# Patient Record
Sex: Male | Born: 1955 | Race: Asian | Hispanic: No | Marital: Married | Smoking: Former smoker
Health system: Southern US, Community
[De-identification: ages and names within clinical notes are randomized; demographics above are authoritative.]

## PROBLEM LIST (undated history)

## (undated) DIAGNOSIS — E785 Hyperlipidemia, unspecified: Secondary | ICD-10-CM

## (undated) DIAGNOSIS — U071 COVID-19: Secondary | ICD-10-CM

## (undated) DIAGNOSIS — M1990 Unspecified osteoarthritis, unspecified site: Secondary | ICD-10-CM

## (undated) DIAGNOSIS — Z9889 Other specified postprocedural states: Secondary | ICD-10-CM

## (undated) DIAGNOSIS — E119 Type 2 diabetes mellitus without complications: Secondary | ICD-10-CM

## (undated) DIAGNOSIS — H269 Unspecified cataract: Secondary | ICD-10-CM

## (undated) DIAGNOSIS — I10 Essential (primary) hypertension: Secondary | ICD-10-CM

## (undated) DIAGNOSIS — T7840XA Allergy, unspecified, initial encounter: Secondary | ICD-10-CM

## (undated) HISTORY — DX: COVID-19: U07.1

## (undated) HISTORY — DX: Allergy, unspecified, initial encounter: T78.40XA

## (undated) HISTORY — DX: Essential (primary) hypertension: I10

## (undated) HISTORY — DX: Hyperlipidemia, unspecified: E78.5

## (undated) HISTORY — PX: POLYPECTOMY: SHX149

## (undated) HISTORY — DX: Unspecified cataract: H26.9

## (undated) HISTORY — PX: CYST REMOVAL NECK: SHX6281

## (undated) HISTORY — PX: COLONOSCOPY: SHX174

## (undated) HISTORY — DX: Unspecified osteoarthritis, unspecified site: M19.90

## (undated) HISTORY — DX: Type 2 diabetes mellitus without complications: E11.9

## (undated) HISTORY — PX: CARPAL TUNNEL RELEASE: SHX101

## (undated) HISTORY — PX: CATARACT EXTRACTION: SUR2

## (undated) HISTORY — DX: Other specified postprocedural states: Z98.890

---

## 1988-01-08 HISTORY — PX: CHOLECYSTECTOMY: SHX55

## 2011-04-08 ENCOUNTER — Ambulatory Visit (INDEPENDENT_AMBULATORY_CARE_PROVIDER_SITE_OTHER): Payer: Federal, State, Local not specified - PPO | Admitting: Internal Medicine

## 2011-04-08 VITALS — BP 131/77 | HR 70 | Temp 98.6°F | Resp 16 | Ht 62.5 in | Wt 151.0 lb

## 2011-04-08 DIAGNOSIS — Z0000 Encounter for general adult medical examination without abnormal findings: Secondary | ICD-10-CM

## 2011-04-08 DIAGNOSIS — H409 Unspecified glaucoma: Secondary | ICD-10-CM | POA: Insufficient documentation

## 2011-04-08 DIAGNOSIS — H9191 Unspecified hearing loss, right ear: Secondary | ICD-10-CM

## 2011-04-08 DIAGNOSIS — R739 Hyperglycemia, unspecified: Secondary | ICD-10-CM

## 2011-04-08 DIAGNOSIS — E785 Hyperlipidemia, unspecified: Secondary | ICD-10-CM

## 2011-04-08 DIAGNOSIS — R7309 Other abnormal glucose: Secondary | ICD-10-CM

## 2011-04-08 DIAGNOSIS — I10 Essential (primary) hypertension: Secondary | ICD-10-CM | POA: Insufficient documentation

## 2011-04-08 LAB — POCT CBC
Lymph, poc: 2.3 (ref 0.6–3.4)
MCHC: 33 g/dL (ref 31.8–35.4)
MPV: 11.8 fL (ref 0–99.8)
POC Granulocyte: 5.2 (ref 2–6.9)
POC LYMPH PERCENT: 28.6 %L (ref 10–50)
POC MID %: 6.9 %M (ref 0–12)
RDW, POC: 14 %

## 2011-04-08 LAB — COMPREHENSIVE METABOLIC PANEL
ALT: 41 U/L (ref 0–53)
AST: 34 U/L (ref 0–37)
BUN: 20 mg/dL (ref 6–23)
CO2: 27 mEq/L (ref 19–32)
Creat: 1.03 mg/dL (ref 0.50–1.35)
Total Bilirubin: 0.5 mg/dL (ref 0.3–1.2)

## 2011-04-08 LAB — PSA: PSA: 1.4 ng/mL (ref <=4.00)

## 2011-04-08 LAB — LIPID PANEL
HDL: 47 mg/dL (ref >39)
Total CHOL/HDL Ratio: 3.4 Ratio
Triglycerides: 88 mg/dL (ref <150)

## 2011-04-08 LAB — POCT GLYCOSYLATED HEMOGLOBIN (HGB A1C): Hemoglobin A1C: 6.1

## 2011-04-08 LAB — IFOBT (OCCULT BLOOD): IFOBT: NEGATIVE

## 2011-04-08 MED ORDER — ROSUVASTATIN CALCIUM 20 MG PO TABS: 20.0000 mg | ORAL_TABLET | Freq: Every day | ORAL | Status: DC

## 2011-04-08 MED ORDER — AMLODIPINE BESYLATE 10 MG PO TABS: 10.0000 mg | ORAL_TABLET | Freq: Every day | ORAL | Status: DC

## 2011-04-08 NOTE — Progress Notes (Signed)
  Subjective:    Patient ID: Jesse Baldwin, male    DOB: July 19, 1955, 56 y.o.   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   HENT: Negative.   Eyes: Negative.   Respiratory: Negative.   Cardiovascular: Negative.   Gastrointestinal: Negative.   Genitourinary: Negative.   Musculoskeletal: Negative.        Has mild left lateral epicondylitis and uses a strap at work  Skin: Negative.   Neurological: Negative.   Hematological: Negative.   Psychiatric/Behavioral: Negative.        Objective:   Physical Exam  Constitutional: He is oriented to person, place, and time. He appears well-developed and well-nourished.  HENT:  Head: Normocephalic.  Right Ear: External ear normal.  Left Ear: External ear normal.       There is decreased hearing in the right ear from noise injury  Eyes: Conjunctivae and EOM are normal. Pupils are equal, round, and reactive to light.       Both eyes have slight injection as usual  Neck: Normal range of motion. Neck supple. No thyromegaly present.  Cardiovascular: Normal rate, regular rhythm, normal heart sounds and intact distal pulses.   No murmur heard. Pulmonary/Chest: Effort normal and breath sounds normal.  Abdominal: Soft. Bowel sounds are normal.       No organomegaly  Genitourinary: Rectum normal and prostate normal.       Hemosure pending  Musculoskeletal: Normal range of motion. He exhibits no edema.  Neurological: He is alert and oriented to person, place, and time.  Skin: No rash noted.  Psychiatric: He has a normal mood and affect. Thought content normal.          Results for orders placed in visit on 04/08/11  POCT CBC      Component Value Range   WBC 8.1  4.6 - 10.2 (K/uL)   Lymph, poc 2.3  0.6 - 3.4    POC LYMPH PERCENT 28.6  10 - 50 (%L)   MID (cbc) 0.6  0 - 0.9    POC MID % 6.9  0 - 12 (%M)   POC Granulocyte 5.2  2 - 6.9    Granulocyte percent 64.5  37 - 80 (%G)   RBC 5.20  4.69 - 6.13 (M/uL)   Hemoglobin 15.1  14.1 - 18.1 (g/dL)   HCT, POC 62.9  52.8 - 53.7 (%)   MCV 88.0  80 - 97 (fL)   MCH, POC 29.0  27 - 31.2 (pg)   MCHC 33.0  31.8 - 35.4 (g/dL)   RDW, POC 41.3     Platelet Count, POC 189  142 - 424 (K/uL)   MPV 11.8  0 - 99.8 (fL)  POCT GLYCOSYLATED HEMOGLOBIN (HGB A1C)      Component Value Range   Hemoglobin A1C 6.1      Assessment & Plan:

## 2011-04-13 ENCOUNTER — Encounter: Payer: Self-pay | Admitting: Internal Medicine

## 2011-05-25 ENCOUNTER — Ambulatory Visit (INDEPENDENT_AMBULATORY_CARE_PROVIDER_SITE_OTHER): Payer: Federal, State, Local not specified - PPO | Admitting: Internal Medicine

## 2011-05-25 VITALS — BP 109/70 | HR 73 | Temp 99.0°F | Resp 18 | Ht 62.25 in | Wt 150.6 lb

## 2011-05-25 DIAGNOSIS — J029 Acute pharyngitis, unspecified: Secondary | ICD-10-CM

## 2011-05-25 DIAGNOSIS — J019 Acute sinusitis, unspecified: Secondary | ICD-10-CM

## 2011-05-25 LAB — POCT RAPID STREP A (OFFICE): Rapid Strep A Screen: NEGATIVE

## 2011-05-25 MED ORDER — AMOXICILLIN-POT CLAVULANATE 875-125 MG PO TABS: 1.0000 | ORAL_TABLET | Freq: Two times a day (BID) | ORAL | Status: AC

## 2011-05-25 MED ORDER — HYDROCODONE-HOMATROPINE 5-1.5 MG/5ML PO SYRP: 5.0000 mL | ORAL_SOLUTION | Freq: Four times a day (QID) | ORAL | Status: AC | PRN

## 2011-05-25 NOTE — Progress Notes (Signed)
  Subjective:    Patient ID: Jesse Baldwin, male    DOB: 07/12/1955, 56 y.o.   MRN: [id number]  HPIfever cough sore throat, fatigue, dizzy 3-4 days Can't sleep due to cough-nonproductive      Review of Systems     Objective:   Physical Exam  HEENT clear except purulent discharge nares and in pharyngeal injection No nodes Lungs clear to auscultation      Results for orders placed in visit on 05/25/11  POCT RAPID STREP A (OFFICE)      Component Value Range   Rapid Strep A Screen Negative  Negative     Assessment & Plan:  viral syndrome developing into sinusitis Meds ordered this encounter  Medications  . latanoprost (XALATAN) 0.005 % ophthalmic solution    Sig: 1 drop at bedtime.  Marland Kitchen amoxicillin-clavulanate (AUGMENTIN) 875-125 MG per tablet    Sig: Take 1 tablet by mouth 2 (two) times daily.    Dispense:  20 tablet    Refill:  0  . HYDROcodone-homatropine (HYCODAN) 5-1.5 MG/5ML syrup    Sig: Take 5 mLs by mouth every 6 (six) hours as needed for cough.    Dispense:  120 mL    Refill:  0

## 2012-03-31 ENCOUNTER — Ambulatory Visit (INDEPENDENT_AMBULATORY_CARE_PROVIDER_SITE_OTHER): Payer: Federal, State, Local not specified - PPO | Admitting: Internal Medicine

## 2012-03-31 VITALS — BP 138/82 | HR 69 | Temp 97.9°F | Resp 16 | Ht 63.0 in | Wt 154.8 lb

## 2012-03-31 DIAGNOSIS — R739 Hyperglycemia, unspecified: Secondary | ICD-10-CM

## 2012-03-31 DIAGNOSIS — R7309 Other abnormal glucose: Secondary | ICD-10-CM

## 2012-03-31 DIAGNOSIS — E785 Hyperlipidemia, unspecified: Secondary | ICD-10-CM

## 2012-03-31 DIAGNOSIS — Z125 Encounter for screening for malignant neoplasm of prostate: Secondary | ICD-10-CM

## 2012-03-31 DIAGNOSIS — J019 Acute sinusitis, unspecified: Secondary | ICD-10-CM

## 2012-03-31 DIAGNOSIS — I10 Essential (primary) hypertension: Secondary | ICD-10-CM

## 2012-03-31 LAB — LIPID PANEL
HDL: 39 mg/dL — ABNORMAL LOW (ref >39)
Total CHOL/HDL Ratio: 4.2 Ratio
Triglycerides: 101 mg/dL (ref <150)

## 2012-03-31 LAB — COMPREHENSIVE METABOLIC PANEL
ALT: 48 U/L (ref 0–53)
AST: 39 U/L — ABNORMAL HIGH (ref 0–37)
BUN: 11 mg/dL (ref 6–23)
Calcium: 9.5 mg/dL (ref 8.4–10.5)
Chloride: 104 mEq/L (ref 96–112)
Creat: 0.99 mg/dL (ref 0.50–1.35)
Total Bilirubin: 0.6 mg/dL (ref 0.3–1.2)

## 2012-03-31 LAB — POCT CBC
Lymph, poc: 2.4 (ref 0.6–3.4)
MCH, POC: 29.2 pg (ref 27–31.2)
MCHC: 32.5 g/dL (ref 31.8–35.4)
MCV: 89.9 fL (ref 80–97)
MID (cbc): 0.9 (ref 0–0.9)
MPV: 10.9 fL (ref 0–99.8)
POC LYMPH PERCENT: 29.3 %L (ref 10–50)
POC MID %: 11 %M (ref 0–12)
Platelet Count, POC: 208 10*3/uL (ref 142–424)
WBC: 8.3 10*3/uL (ref 4.6–10.2)

## 2012-03-31 LAB — POCT GLYCOSYLATED HEMOGLOBIN (HGB A1C): Hemoglobin A1C: 6.5

## 2012-03-31 MED ORDER — AMOXICILLIN-POT CLAVULANATE 875-125 MG PO TABS: 1.0000 | ORAL_TABLET | Freq: Two times a day (BID) | ORAL | Status: DC

## 2012-03-31 MED ORDER — ROSUVASTATIN CALCIUM 20 MG PO TABS: 20.0000 mg | ORAL_TABLET | Freq: Every day | ORAL | Status: DC

## 2012-03-31 MED ORDER — AMLODIPINE BESYLATE 10 MG PO TABS: 10.0000 mg | ORAL_TABLET | Freq: Every day | ORAL | Status: DC

## 2012-04-01 LAB — PSA: PSA: 1.34 ng/mL (ref <=4.00)

## 2012-04-01 NOTE — Progress Notes (Signed)
  Subjective:    Patient ID: Jesse Baldwin, male    DOB: Jan 05, 1956, 57 y.o.   MRN: [id number]  HPI followup for routine illnesses and medications Patient Active Problem List  Diagnosis  . Hypertension  . Hyperlipidemia  . Hyperglycemia  . Glaucoma  . Hearing loss in right ear   also has an acute problem with sinus congestion and purulent drainage for several days/low-grade fever/no cough  Has been doing well with medications without side effects Continues to be active with job No weight gain   Review of Systems No vision changes No night sweats No chest pain or palpitations No gastrointestinal upset No myalgias No urinary symptoms    Objective:   Physical Exam BP 138/82  Pulse 69  Temp(Src) 97.9 F (36.6 C) (Oral)  Resp 16  Ht 5\' 3"  (1.6 m)  Wt 154 lb 12.8 oz (70.217 kg)  BMI 27.43 kg/m2  SpO2 97% Pupils ERRLA TMs clear/nares boggy with purulent discharge/tender sinus series to percussion Throat clear No nodes Chest clear Heart regular without murmur No edema Good peripheral pulses       Assessment & Plan:  Hypertension - Plan: POCT CBC, Comprehensive metabolic panel, amLODipine (NORVASC) 10 MG tablet  Hyperlipidemia - Plan: Lipid panel, rosuvastatin (CRESTOR) 20 MG tablet  Hyperglycemia - Plan: POCT glycosylated hemoglobin (Hb A1C)  Screening for prostate cancer - Plan: PSA  Acute sinusitis, unspecified  Meds ordered this encounter  Medications  . amLODipine (NORVASC) 10 MG tablet    Sig: Take 1 tablet (10 mg total) by mouth daily.    Dispense:  90 tablet    Refill:  3  . rosuvastatin (CRESTOR) 20 MG tablet    Sig: Take 1 tablet (20 mg total) by mouth daily.    Dispense:  90 tablet    Refill:  3  . amoxicillin-clavulanate (AUGMENTIN) 875-125 MG per tablet    Sig: Take 1 tablet by mouth 2 (two) times daily.    Dispense:  20 tablet    Refill:  0    notify labs

## 2012-04-03 ENCOUNTER — Encounter: Payer: Self-pay | Admitting: Internal Medicine

## 2013-01-09 ENCOUNTER — Ambulatory Visit (INDEPENDENT_AMBULATORY_CARE_PROVIDER_SITE_OTHER): Payer: Federal, State, Local not specified - PPO | Admitting: Internal Medicine

## 2013-01-09 VITALS — BP 132/74 | HR 85 | Temp 99.1°F | Resp 16 | Ht 62.0 in | Wt 152.8 lb

## 2013-01-09 DIAGNOSIS — I10 Essential (primary) hypertension: Secondary | ICD-10-CM

## 2013-01-09 DIAGNOSIS — R739 Hyperglycemia, unspecified: Secondary | ICD-10-CM

## 2013-01-09 DIAGNOSIS — R7309 Other abnormal glucose: Secondary | ICD-10-CM

## 2013-01-09 DIAGNOSIS — E785 Hyperlipidemia, unspecified: Secondary | ICD-10-CM

## 2013-01-09 LAB — CBC
HCT: 47.6 % (ref 39.0–52.0)
Hemoglobin: 16.2 g/dL (ref 13.0–17.0)
MCH: 29.3 pg (ref 26.0–34.0)
MCHC: 34 g/dL (ref 30.0–36.0)
MCV: 86.1 fL (ref 78.0–100.0)
PLATELETS: 197 10*3/uL (ref 150–400)
RBC: 5.53 MIL/uL (ref 4.22–5.81)
RDW: 14.3 % (ref 11.5–15.5)
WBC: 9 10*3/uL (ref 4.0–10.5)

## 2013-01-09 LAB — COMPREHENSIVE METABOLIC PANEL
ALT: 26 U/L (ref 0–53)
AST: 27 U/L (ref 0–37)
Albumin: 4.4 g/dL (ref 3.5–5.2)
Alkaline Phosphatase: 91 U/L (ref 39–117)
BUN: 15 mg/dL (ref 6–23)
CALCIUM: 9.3 mg/dL (ref 8.4–10.5)
CO2: 28 mEq/L (ref 19–32)
CREATININE: 1 mg/dL (ref 0.50–1.35)
Chloride: 104 mEq/L (ref 96–112)
Glucose, Bld: 111 mg/dL — ABNORMAL HIGH (ref 70–99)
Potassium: 3.8 mEq/L (ref 3.5–5.3)
Sodium: 140 mEq/L (ref 135–145)
Total Bilirubin: 0.7 mg/dL (ref 0.3–1.2)
Total Protein: 7.7 g/dL (ref 6.0–8.3)

## 2013-01-09 LAB — LIPID PANEL
CHOL/HDL RATIO: 3.9 ratio
Cholesterol: 153 mg/dL (ref 0–200)
HDL: 39 mg/dL — AB (ref >39)
LDL Cholesterol: 84 mg/dL (ref 0–99)
Triglycerides: 149 mg/dL (ref <150)
VLDL: 30 mg/dL (ref 0–40)

## 2013-01-09 LAB — POCT GLYCOSYLATED HEMOGLOBIN (HGB A1C): Hemoglobin A1C: 7.1

## 2013-01-09 MED ORDER — AMLODIPINE BESYLATE 10 MG PO TABS: 10.0000 mg | ORAL_TABLET | Freq: Every day | ORAL | Status: DC

## 2013-01-09 MED ORDER — ROSUVASTATIN CALCIUM 20 MG PO TABS: 20.0000 mg | ORAL_TABLET | Freq: Every day | ORAL | Status: DC

## 2013-01-09 NOTE — Progress Notes (Signed)
   Subjective:    Patient ID: Jesse Baldwin, male    DOB: 11/04/1955, 58 y.o.   MRN: [id number]  HPI Patient Active Problem List   Diagnosis Date Noted  . Hypertension 04/08/2011  . Hyperlipidemia 04/08/2011  . Hyperglycemia 04/08/2011  . Glaucoma 04/08/2011  . Hearing loss in right ear 04/08/2011   Current outpatient prescriptions:amLODipine (NORVASC) 10 MG tablet, Take 1 tablet (10 mg total) by mouth daily., Disp: 90 tablet, Rfl: 3;  latanoprost (XALATAN) 0.005 % ophthalmic solution, 1 drop at bedtime., Disp: , Rfl: ;  rosuvastatin (CRESTOR) 20 MG tablet, Take 1 tablet (20 mg total) by mouth daily., Disp: 90 tablet, Rfl: 3  Doing well in general Immunizations up to date Last labs normal  Review of Systems All systems negative except beginning to have some problems with trigger finger on the right fourth and again. He has had surgery bilaterally for this years ago    Objective:   Physical Exam BP 132/74  Pulse 85  Temp(Src) 99.1 F (37.3 C) (Oral)  Resp 16  Ht 5\' 2"  (1.575 m)  Wt 152 lb 12.8 oz (69.31 kg)  BMI 27.94 kg/m2  SpO2 96% HEENT clear Heart regular without murmur Lungs clear Extremities clear except right fourth finger has mild nodularity in the palm without triggering but with slight crepitus Neurological intact       Assessment & Plan:  Hypertension - Plan: amLODipine (NORVASC) 10 MG tablet, Comprehensive metabolic panel, Lipid panel, CBC, CANCELED: CBC with Differential, CANCELED: Comprehensive metabolic panel, CANCELED: POCT CBC, CANCELED: POCT glycosylated hemoglobin (Hb A1C)  Hyperlipidemia - Plan: rosuvastatin (CRESTOR) 20 MG tablet, Comprehensive metabolic panel, Lipid panel, CBC, CANCELED: Lipid panel, CANCELED: POCT CBC, CANCELED: POCT glycosylated hemoglobin (Hb A1C)  Hyperglycemia - Plan: POCT glycosylated hemoglobin (Hb A1C), Comprehensive metabolic panel, Lipid panel, CBC, CANCELED: POCT CBC, CANCELED: POCT glycosylated hemoglobin (Hb  A1C)  Meds ordered this encounter  Medications  . amLODipine (NORVASC) 10 MG tablet    Sig: Take 1 tablet (10 mg total) by mouth daily.    Dispense:  90 tablet    Refill:  3  . rosuvastatin (CRESTOR) 20 MG tablet    Sig: Take 1 tablet (20 mg total) by mouth daily.    Dispense:  90 tablet    Refill:  3   Routine labs

## 2013-01-11 ENCOUNTER — Encounter: Payer: Self-pay | Admitting: Internal Medicine

## 2014-01-22 ENCOUNTER — Other Ambulatory Visit: Payer: Self-pay | Admitting: Internal Medicine

## 2014-02-06 ENCOUNTER — Ambulatory Visit (INDEPENDENT_AMBULATORY_CARE_PROVIDER_SITE_OTHER): Payer: Federal, State, Local not specified - PPO | Admitting: Internal Medicine

## 2014-02-06 VITALS — BP 122/78 | HR 69 | Temp 98.3°F | Resp 18 | Ht 62.0 in | Wt 150.1 lb

## 2014-02-06 DIAGNOSIS — Z1211 Encounter for screening for malignant neoplasm of colon: Secondary | ICD-10-CM

## 2014-02-06 DIAGNOSIS — E785 Hyperlipidemia, unspecified: Secondary | ICD-10-CM

## 2014-02-06 DIAGNOSIS — Z125 Encounter for screening for malignant neoplasm of prostate: Secondary | ICD-10-CM

## 2014-02-06 DIAGNOSIS — Z0000 Encounter for general adult medical examination without abnormal findings: Secondary | ICD-10-CM

## 2014-02-06 DIAGNOSIS — R739 Hyperglycemia, unspecified: Secondary | ICD-10-CM

## 2014-02-06 LAB — LIPID PANEL
Cholesterol: 168 mg/dL (ref 0–200)
HDL: 44 mg/dL (ref >39)
LDL Cholesterol: 107 mg/dL — ABNORMAL HIGH (ref 0–99)
TRIGLYCERIDES: 84 mg/dL (ref <150)
Total CHOL/HDL Ratio: 3.8 Ratio
VLDL: 17 mg/dL (ref 0–40)

## 2014-02-06 LAB — COMPLETE METABOLIC PANEL WITH GFR
ALK PHOS: 99 U/L (ref 39–117)
ALT: 31 U/L (ref 0–53)
AST: 29 U/L (ref 0–37)
Albumin: 4.5 g/dL (ref 3.5–5.2)
BUN: 15 mg/dL (ref 6–23)
CO2: 28 mEq/L (ref 19–32)
CREATININE: 1.01 mg/dL (ref 0.50–1.35)
Calcium: 9.6 mg/dL (ref 8.4–10.5)
Chloride: 101 mEq/L (ref 96–112)
GFR, Est African American: >89 mL/min
GFR, Est Non African American: 80 mL/min
Glucose, Bld: 148 mg/dL — ABNORMAL HIGH (ref 70–99)
Potassium: 3.9 mEq/L (ref 3.5–5.3)
Sodium: 137 mEq/L (ref 135–145)
TOTAL PROTEIN: 8.3 g/dL (ref 6.0–8.3)
Total Bilirubin: 0.7 mg/dL (ref 0.2–1.2)

## 2014-02-06 LAB — POCT CBC
Granulocyte percent: 59.6 %G (ref 37–80)
HEMATOCRIT: 51.6 % (ref 43.5–53.7)
Hemoglobin: 16.8 g/dL (ref 14.1–18.1)
Lymph, poc: 1.9 (ref 0.6–3.4)
MCH: 28.9 pg (ref 27–31.2)
MCHC: 32.6 g/dL (ref 31.8–35.4)
MCV: 88.7 fL (ref 80–97)
MID (cbc): 0.6 (ref 0–0.9)
MPV: 8.7 fL (ref 0–99.8)
POC Granulocyte: 3.6 (ref 2–6.9)
POC LYMPH %: 30.7 % (ref 10–50)
POC MID %: 9.7 %M (ref 0–12)
Platelet Count, POC: 166 10*3/uL (ref 142–424)
RBC: 5.82 M/uL (ref 4.69–6.13)
RDW, POC: 13.9 %
WBC: 6.1 10*3/uL (ref 4.6–10.2)

## 2014-02-06 LAB — POCT GLYCOSYLATED HEMOGLOBIN (HGB A1C): Hemoglobin A1C: 7.6

## 2014-02-06 MED ORDER — ROSUVASTATIN CALCIUM 20 MG PO TABS: 20.0000 mg | ORAL_TABLET | Freq: Every day | ORAL | Status: DC

## 2014-02-06 MED ORDER — AMLODIPINE BESYLATE 10 MG PO TABS: 10.0000 mg | ORAL_TABLET | Freq: Every day | ORAL | Status: DC

## 2014-02-06 MED ORDER — METFORMIN HCL ER 500 MG PO TB24: 500.0000 mg | ORAL_TABLET | Freq: Every day | ORAL | Status: DC

## 2014-02-06 NOTE — Addendum Note (Signed)
Addended by: Leandrew Koyanagi on: 02/06/2014 11:14 AM   Modules accepted: Orders

## 2014-02-06 NOTE — Progress Notes (Signed)
Subjective:  This chart was scribed for Tami Lin, MD by Dellis Filbert, ED Scribe at [facility].The patient was seen in exam room 04 and the patient's care was started at 9:12 AM.   Patient ID: Jesse Baldwin, male    DOB: 03-19-1955, 59 y.o.   MRN: [id number] Chief Complaint  Patient presents with  . Annual Exam   HPI HPI Comments: Jesse Baldwin is a 59 y.o. male with a history of hypertension, hyperlipidemia, hyperglycemia and glaucoma who presents to [facility] for an annual exam. He needs his Crestor refilled and an A1C check. He has never had a colonoscopy. Tdap 2012  Pt complains of a leg cramp on both legs, which primarily occur at night. No problems with daytime activity.  He also have right shoulder pain. The pain worsens with movement. Present for most of the past year or even longer and not debilitating-no nighttime symptoms and no trouble at work.   Immunization History  Administered Date(s) Administered  . Influenza-Unspecified 10/28/2013   Patient Active Problem List   Diagnosis Date Noted  . Hypertension 04/08/2011  . Hyperlipidemia 04/08/2011  . Hyperglycemia--- note hyperglycemia with normal hemoglobin A1c's over the past several years until last year 7.1 04/08/2011  . Glaucoma 04/08/2011  . Hearing loss in right ear 04/08/2011   Past Medical History  Diagnosis Date  . Hypertension   . Hyperlipidemia    No past surgical history on file. Allergies  Allergen Reactions  . Septra [Bactrim] Nausea And Vomiting   Prior to Admission medications   Medication Sig Start Date End Date Taking? Authorizing Provider  amLODipine (NORVASC) 10 MG tablet Take 1 tablet (10 mg total) by mouth daily. PATIENT NEEDS OFFICE VISIT FOR ADDITIONAL REFILLS 01/24/14  Yes Leandrew Koyanagi, MD  latanoprost (XALATAN) 0.005 % ophthalmic solution 1 drop at bedtime.   Yes Historical Provider, MD  rosuvastatin (CRESTOR) 20 MG tablet Take 1 tablet (20 mg total) by  mouth daily. PATIENT NEEDS OFFICE VISIT FOR ADDITIONAL REFILLS 01/24/14  Yes Leandrew Koyanagi, MD   History   Social History  . Marital Status: Married    Spouse Name: N/A    Number of Children: N/A  . Years of Education: N/A   Occupational History  . Not on file.   Social History Main Topics  . Smoking status: Former Smoker    Quit date: 04/07/1981  . Smokeless tobacco: Not on file  . Alcohol Use: No  . Drug Use: No  . Sexual Activity: Yes    Birth Control/ Protection: None   Other Topics Concern  . Not on file   Social History Narrative   Review of Systems  Musculoskeletal: Positive for myalgias and arthralgias.   remainder of the 14 point review of systems is negative per form    Objective:  Physical Exam  Constitutional: He is oriented to person, place, and time. He appears well-developed and well-nourished. No distress.  HENT:  Head: Normocephalic and atraumatic.  Right Ear: External ear normal.  Left Ear: External ear normal.  Nose: Nose normal.  Mouth/Throat: Oropharynx is clear and moist.  Tms and canals clear  Eyes: Conjunctivae and EOM are normal. Pupils are equal, round, and reactive to light.  Neck: Normal range of motion. Neck supple. No thyromegaly present.  Cardiovascular: Normal rate, regular rhythm, normal heart sounds and intact distal pulses.   No murmur heard. Pulmonary/Chest: Effort normal and breath sounds normal. No respiratory distress. He has no wheezes.  He has no rales.  Abdominal: Soft. Bowel sounds are normal. He exhibits no distension and no mass. There is no tenderness. There is no rebound and no guarding.  No hepatosplenomegaly  Musculoskeletal: Normal range of motion. He exhibits no edema or tenderness.  The right shoulder has a good range of motion with discomfort on external rotation and abduction against resistance. No crepitus or swelling. No weakness.  Lymphadenopathy:    He has no cervical adenopathy.  Neurological: He is  alert and oriented to person, place, and time. He has normal reflexes. No cranial nerve deficit. He exhibits normal muscle tone. Coordination normal.  Skin: Skin is warm and dry. No rash noted.  Psychiatric: He has a normal mood and affect. His behavior is normal. Judgment and thought content normal.  Nursing note and vitals reviewed.  BP 122/78 mmHg  Pulse 69  Temp(Src) 98.3 F (36.8 C) (Oral)  Resp 18  Ht 5\' 2"  (1.575 m)  Wt 150 lb 1 oz (68.067 kg)  BMI 27.44 kg/m2  SpO2 98%  Results for orders placed or performed in visit on 02/06/14  POCT CBC  Result Value Ref Range   WBC 6.1 4.6 - 10.2 K/uL   Lymph, poc 1.9 0.6 - 3.4   POC LYMPH PERCENT 30.7 10 - 50 %L   MID (cbc) 0.6 0 - 0.9   POC MID % 9.7 0 - 12 %M   POC Granulocyte 3.6 2 - 6.9   Granulocyte percent 59.6 37 - 80 %G   RBC 5.82 4.69 - 6.13 M/uL   Hemoglobin 16.8 14.1 - 18.1 g/dL   HCT, POC 51.6 43.5 - 53.7 %   MCV 88.7 80 - 97 fL   MCH, POC 28.9 27 - 31.2 pg   MCHC 32.6 31.8 - 35.4 g/dL   RDW, POC 13.9 %   Platelet Count, POC 166 142 - 424 K/uL   MPV 8.7 0 - 99.8 fL  POCT glycosylated hemoglobin (Hb A1C)  Result Value Ref Range   Hemoglobin A1C 7.6        Assessment & Plan:  Annual physical exam - Plan: POCT CBC, COMPLETE METABOLIC PANEL WITH GFR, Lipid panel, PSA  Hyperlipidemia - Plan:lipids  Hyperglycemia/new onset type 2 diabetes without complications -  He just had eye examination with Dr. Bing Plume one month ago  Start metformin 500 extended release/recheck labs in 4 months  Diet  Screening for prostate cancer - Plan:  PSA  Meds ordered this encounter  Medications  . DISCONTD: rosuvastatin (CRESTOR) 20 MG tablet    Sig: Take 1 tablet (20 mg total) by mouth daily. PATIENT NEEDS OFFICE VISIT FOR ADDITIONAL REFILLS    Dispense:  30 tablet    Refill:  0  . DISCONTD: amLODipine (NORVASC) 10 MG tablet    Sig: Take 1 tablet (10 mg total) by mouth daily. PATIENT NEEDS OFFICE VISIT FOR ADDITIONAL REFILLS     Dispense:  30 tablet    Refill:  0  . metFORMIN (GLUCOPHAGE XR) 500 MG 24 hr tablet    Sig: Take 1 tablet (500 mg total) by mouth daily with breakfast.    Dispense:  90 tablet    Refill:  3  . amLODipine (NORVASC) 10 MG tablet    Sig: Take 1 tablet (10 mg total) by mouth daily.    Dispense:  90 tablet    Refill:  3  . rosuvastatin (CRESTOR) 20 MG tablet    Sig: Take 1 tablet (20 mg total) by  mouth daily.    Dispense:  90 tablet    Refill:  3   Other labs pending/follow-up 3-4 months       I have completed the patient encounter in its entirety as documented by the scribe, with editing by me where necessary. Robert P. Laney Pastor, M.D.

## 2014-02-07 ENCOUNTER — Encounter: Payer: Self-pay | Admitting: Gastroenterology

## 2014-02-07 LAB — PSA: PSA: 1.31 ng/mL (ref <=4.00)

## 2014-02-09 ENCOUNTER — Encounter: Payer: Self-pay | Admitting: Internal Medicine

## 2014-04-04 ENCOUNTER — Ambulatory Visit (AMBULATORY_SURGERY_CENTER): Payer: Self-pay | Admitting: *Deleted

## 2014-04-04 VITALS — Ht 62.0 in | Wt 149.0 lb

## 2014-04-04 DIAGNOSIS — Z1211 Encounter for screening for malignant neoplasm of colon: Secondary | ICD-10-CM

## 2014-04-04 MED ORDER — NA SULFATE-K SULFATE-MG SULF 17.5-3.13-1.6 GM/177ML PO SOLN: ORAL | Status: DC

## 2014-04-04 NOTE — Progress Notes (Signed)
Patient denies any allergies to eggs or soy. Patient denies any problems with anesthesia/sedation. Patient denies any oxygen use at home and does not take any diet/weight loss medications. No computer use per patient.

## 2014-04-11 ENCOUNTER — Ambulatory Visit (AMBULATORY_SURGERY_CENTER): Payer: Federal, State, Local not specified - PPO | Admitting: Gastroenterology

## 2014-04-11 ENCOUNTER — Encounter: Payer: Self-pay | Admitting: Gastroenterology

## 2014-04-11 VITALS — BP 114/60 | HR 64 | Temp 97.7°F | Resp 18 | Ht 62.0 in | Wt 149.0 lb

## 2014-04-11 DIAGNOSIS — Z1211 Encounter for screening for malignant neoplasm of colon: Secondary | ICD-10-CM | POA: Diagnosis present

## 2014-04-11 DIAGNOSIS — D120 Benign neoplasm of cecum: Secondary | ICD-10-CM | POA: Diagnosis not present

## 2014-04-11 DIAGNOSIS — K5730 Diverticulosis of large intestine without perforation or abscess without bleeding: Secondary | ICD-10-CM

## 2014-04-11 MED ORDER — SODIUM CHLORIDE 0.9 % IV SOLN: 500.0000 mL | INTRAVENOUS | Status: DC

## 2014-04-11 NOTE — Progress Notes (Signed)
Report to PACU, RN, vss, BBS= Clear.  

## 2014-04-11 NOTE — Patient Instructions (Signed)
YOU HAD AN ENDOSCOPIC PROCEDURE TODAY AT [facility]:   Refer to the procedure report that was given to you for any specific questions about what was found during the examination.  If the procedure report does not answer your questions, please call your gastroenterologist to clarify.  If you requested that your care partner not be given the details of your procedure findings, then the procedure report has been included in a sealed envelope for you to review at your convenience later.  YOU SHOULD EXPECT: Some feelings of bloating in the abdomen. Passage of more gas than usual.  Walking can help get rid of the air that was put into your GI tract during the procedure and reduce the bloating. If you had a lower endoscopy (such as a colonoscopy or flexible sigmoidoscopy) you may notice spotting of blood in your stool or on the toilet paper. If you underwent a bowel prep for your procedure, you may not have a normal bowel movement for a few days.  Please Note:  You might notice some irritation and congestion in your nose or some drainage.  This is from the oxygen used during your procedure.  There is no need for concern and it should clear up in a day or so.  SYMPTOMS TO REPORT IMMEDIATELY:   Following lower endoscopy (colonoscopy or flexible sigmoidoscopy):  Excessive amounts of blood in the stool  Significant tenderness or worsening of abdominal pains  Swelling of the abdomen that is new, acute  Fever of 100F or higher    For urgent or emergent issues, a gastroenterologist can be reached at any hour by calling [phone].   DIET: Your first meal following the procedure should be a small meal and then it is ok to progress to your normal diet. Heavy or fried foods are harder to digest and may make you feel nauseous or bloated.  Likewise, meals heavy in dairy and vegetables can increase bloating.  Drink plenty of fluids but you should avoid alcoholic beverages for 24  hours.  ACTIVITY:  You should plan to take it easy for the rest of today and you should NOT DRIVE or use heavy machinery until tomorrow (because of the sedation medicines used during the test).    FOLLOW UP: Our staff will call the number listed on your records the next business day following your procedure to check on you and address any questions or concerns that you may have regarding the information given to you following your procedure. If we do not reach you, we will leave a message.  However, if you are feeling well and you are not experiencing any problems, there is no need to return our call.  We will assume that you have returned to your regular daily activities without incident.  If any biopsies were taken you will be contacted by phone or by letter within the next 1-3 weeks.  Please call us at [phone] if you have not heard about the biopsies in 3 weeks.    SIGNATURES/CONFIDENTIALITY: You and/or your care partner have signed paperwork which will be entered into your electronic medical record.  These signatures attest to the fact that that the information above on your After Visit Summary has been reviewed and is understood.  Full responsibility of the confidentiality of this discharge information lies with you and/or your care-partner.   Information on polyps and diverticulosis given to you today

## 2014-04-11 NOTE — Op Note (Signed)
[place]  [address] [place], [address]   COLONOSCOPY PROCEDURE REPORT  PATIENT: Jesse Baldwin, Jesse Baldwin  MR#: [id number] BIRTHDATE: Sep 10, 1955 , 58  yrs. old GENDER: male ENDOSCOPIST: Inda Castle, MD REFERRED OJ:[id number] Laney Pastor, M.D. PROCEDURE DATE:  04/11/2014 PROCEDURE:   Colonoscopy, screening and Colonoscopy with snare polypectomy First Screening Colonoscopy - Avg.  risk and is 50 yrs.  old or older Yes.  Prior Negative Screening - Now for repeat screening. N/A  History of Adenoma - Now for follow-up colonoscopy & has been > or = to 3 yrs.  N/A ASA CLASS:   Class II INDICATIONS:Colorectal Neoplasm Risk Assessment for this procedure is average risk. MEDICATIONS: Monitored anesthesia care and Propofol 200 mg IV  DESCRIPTION OF PROCEDURE:   After the risks benefits and alternatives of the procedure were thoroughly explained, informed consent was obtained.  The digital rectal exam revealed no abnormalities of the rectum.   The LB [id number]  endoscope was introduced through the anus and advanced to the ileum. No adverse events experienced.   The quality of the prep was (Suprep was used) excellent.  The instrument was then slowly withdrawn as the colon was fully examined.      COLON FINDINGS: A sessile polyp measuring 3 mm in size was found at the cecum.  A polypectomy was performed with a cold snare.  The resection was complete, the polyp tissue was completely retrieved and sent to histology.   There was mild diverticulosis noted in the ascending colon.   The examination was otherwise normal. Retroflexed views revealed no abnormalities. The time to cecum = 2.4 Withdrawal time = 10.6   The scope was withdrawn and the procedure completed. COMPLICATIONS: There were no immediate complications.  ENDOSCOPIC IMPRESSION: 1.   Sessile polyp was found at the cecum; polypectomy was performed with a cold snare 2.   Mild diverticulosis was noted in the  ascending colon 3.   The examination was otherwise normal  RECOMMENDATIONS: If the polyp(s) removed today are proven to be adenomatous (pre-cancerous) polyps, you will need a repeat colonoscopy in 5 years.  Otherwise you should continue to follow colorectal cancer screening guidelines for "routine risk" patients with colonoscopy in 10 years.  You will receive a letter within 1-2 weeks with the results of your biopsy as well as final recommendations.  Please call my office if you have not received a letter after 3 weeks.  eSigned:  Inda Castle, MD 04/11/2014 9:01 AM   cc:   PATIENT NAME:  Jesse Baldwin, Jesse Baldwin MR#: [id number]

## 2014-04-11 NOTE — Progress Notes (Signed)
Called to room to assist during endoscopic procedure.  Patient ID and intended procedure confirmed with present staff. Received instructions for my participation in the procedure from the performing physician.  

## 2014-04-12 ENCOUNTER — Telehealth: Payer: Self-pay

## 2014-04-12 NOTE — Telephone Encounter (Signed)
No answer, left message

## 2014-04-18 ENCOUNTER — Encounter: Payer: Self-pay | Admitting: Gastroenterology

## 2014-11-07 ENCOUNTER — Ambulatory Visit (INDEPENDENT_AMBULATORY_CARE_PROVIDER_SITE_OTHER): Payer: Federal, State, Local not specified - PPO | Admitting: Internal Medicine

## 2014-11-07 VITALS — BP 120/70 | HR 64 | Temp 98.3°F | Resp 16 | Ht 63.0 in | Wt 148.0 lb

## 2014-11-07 DIAGNOSIS — R739 Hyperglycemia, unspecified: Secondary | ICD-10-CM

## 2014-11-07 DIAGNOSIS — E785 Hyperlipidemia, unspecified: Secondary | ICD-10-CM | POA: Diagnosis not present

## 2014-11-07 DIAGNOSIS — Z1159 Encounter for screening for other viral diseases: Secondary | ICD-10-CM

## 2014-11-07 DIAGNOSIS — Z114 Encounter for screening for human immunodeficiency virus [HIV]: Secondary | ICD-10-CM | POA: Diagnosis not present

## 2014-11-07 LAB — HEMOGLOBIN A1C: HEMOGLOBIN A1C: 7.2 % — AB (ref 4.0–6.0)

## 2014-11-07 LAB — LIPID PANEL
CHOLESTEROL: 161 mg/dL (ref 125–200)
HDL: 38 mg/dL — ABNORMAL LOW (ref >=40)
LDL Cholesterol: 110 mg/dL (ref <130)
Total CHOL/HDL Ratio: 4.2 Ratio (ref <=5.0)
Triglycerides: 65 mg/dL (ref <150)
VLDL: 13 mg/dL (ref <30)

## 2014-11-07 LAB — HIV ANTIBODY (ROUTINE TESTING W REFLEX): HIV 1&2 Ab, 4th Generation: NONREACTIVE

## 2014-11-07 LAB — HEPATITIS C ANTIBODY: HCV AB: NEGATIVE

## 2014-11-07 LAB — POCT GLYCOSYLATED HEMOGLOBIN (HGB A1C): HEMOGLOBIN A1C: 7.2

## 2014-11-07 NOTE — Progress Notes (Signed)
   Subjective:  This chart was scribed for Tami Lin, MD by Thea Alken, ED Scribe. This patient was seen in room 5 and the patient's care was started at 9:43 AM.   Patient ID: Jesse Baldwin, male    DOB: 26-Aug-1955, 59 y.o.   MRN: [id number]  HPI   Chief Complaint  Patient presents with  . OTHER    pt. wants labwork done    HPI Comments: Jesse Baldwin is a 59 y.o. male who presents to the [facility] work. Last visit with me was 02/06/14 for physical.  Pt is doing well.  He has had colonoscopy.  He did not start metformin, as advised at last visit in January.  He is still having right wrist pain. He has been using a splint with work. He has been seen by Dr.Ramos and states they have discussed possible surgery. Pt thinks he need some time off work to rest wrist.     Past Medical History  Diagnosis Date  . Hypertension   . Hyperlipidemia   . Diabetes mellitus without complication ([place])    Prior to Admission medications   Medication Sig Start Date End Date Taking? Authorizing Provider  amLODipine (NORVASC) 10 MG tablet Take 1 tablet (10 mg total) by mouth daily. 02/06/14  Yes Leandrew Koyanagi, MD  latanoprost (XALATAN) 0.005 % ophthalmic solution 1 drop at bedtime.   Yes Historical Provider, MD  metFORMIN (GLUCOPHAGE XR) 500 MG 24 hr tablet Take 1 tablet (500 mg total) by mouth daily with breakfast. 02/06/14  Yes Leandrew Koyanagi, MD  rosuvastatin (CRESTOR) 20 MG tablet Take 1 tablet (20 mg total) by mouth daily. 02/06/14  Yes Leandrew Koyanagi, MD    Review of Systems [place]--stable    Objective:   Physical Exam  Constitutional: He is oriented to person, place, and time. He appears well-developed and well-nourished. No distress.  HENT:  Head: Normocephalic and atraumatic.  Eyes: Pupils are equal, round, and reactive to light.  Neck: Normal range of motion.  Cardiovascular: Normal rate and regular rhythm.   Pulmonary/Chest: Effort normal. No  respiratory distress.  Musculoskeletal: Normal range of motion.  Neurological: He is alert and oriented to person, place, and time.  Skin: Skin is warm and dry.  Psychiatric: He has a normal mood and affect. His behavior is normal.  Nursing note and vitals reviewed.  Filed Vitals:   11/07/14 0926  BP: 120/70  Pulse: 64  Temp: 98.3 F (36.8 C)  TempSrc: Oral  Resp: 16  Height: 5\' 3"  (1.6 m)  Weight: 148 lb (67.132 kg)  SpO2: 98%   Results for orders placed or performed in visit on 11/07/14  POCT glycosylated hemoglobin (Hb A1C)  Result Value Ref Range   Hemoglobin A1C 7.2     Assessment & Plan:   1. Hyperglycemia   2. Hyperlipidemia   3. Need for hepatitis C screening test   4. Screening for HIV (human immunodeficiency virus)     Improving A1C w/ diet!!!-he wishes to continue this  No orders of the defined types were placed in this encounter.    I have completed the patient encounter in its entirety as documented by the scribe, with editing by me where necessary. Markeya Mincy P. Laney Pastor, M.D.

## 2014-11-09 ENCOUNTER — Encounter: Payer: Self-pay | Admitting: Internal Medicine

## 2014-11-09 ENCOUNTER — Encounter: Payer: Self-pay | Admitting: Family Medicine

## 2014-12-05 ENCOUNTER — Encounter: Payer: Self-pay | Admitting: Internal Medicine

## 2015-01-09 ENCOUNTER — Ambulatory Visit (INDEPENDENT_AMBULATORY_CARE_PROVIDER_SITE_OTHER): Payer: Federal, State, Local not specified - PPO | Admitting: Internal Medicine

## 2015-01-09 VITALS — BP 136/86 | HR 85 | Temp 99.1°F | Resp 16 | Ht 63.0 in | Wt 149.0 lb

## 2015-01-09 DIAGNOSIS — Z0000 Encounter for general adult medical examination without abnormal findings: Secondary | ICD-10-CM | POA: Diagnosis not present

## 2015-01-09 DIAGNOSIS — R739 Hyperglycemia, unspecified: Secondary | ICD-10-CM | POA: Diagnosis not present

## 2015-01-09 DIAGNOSIS — E119 Type 2 diabetes mellitus without complications: Secondary | ICD-10-CM

## 2015-01-09 DIAGNOSIS — I10 Essential (primary) hypertension: Secondary | ICD-10-CM | POA: Diagnosis not present

## 2015-01-09 DIAGNOSIS — Z125 Encounter for screening for malignant neoplasm of prostate: Secondary | ICD-10-CM

## 2015-01-09 DIAGNOSIS — E1129 Type 2 diabetes mellitus with other diabetic kidney complication: Secondary | ICD-10-CM | POA: Insufficient documentation

## 2015-01-09 DIAGNOSIS — E785 Hyperlipidemia, unspecified: Secondary | ICD-10-CM

## 2015-01-09 LAB — POCT GLYCOSYLATED HEMOGLOBIN (HGB A1C): Hemoglobin A1C: 7.5

## 2015-01-09 MED ORDER — AMLODIPINE BESYLATE 10 MG PO TABS: 10.0000 mg | ORAL_TABLET | Freq: Every day | ORAL | Status: DC

## 2015-01-09 MED ORDER — ZOSTER VACCINE LIVE 19400 UNT/0.65ML ~~LOC~~ SOLR: 0.6500 mL | Freq: Once | SUBCUTANEOUS | Status: DC

## 2015-01-09 MED ORDER — METFORMIN HCL ER 500 MG PO TB24: 500.0000 mg | ORAL_TABLET | Freq: Every day | ORAL | Status: DC

## 2015-01-09 MED ORDER — ROSUVASTATIN CALCIUM 20 MG PO TABS: 20.0000 mg | ORAL_TABLET | Freq: Every day | ORAL | Status: DC

## 2015-01-09 NOTE — Progress Notes (Signed)
Subjective:  By signing my name below, I, Raven Small, attest that this documentation has been prepared under the direction and in the presence of Tami Lin, MD.  Electronically Signed: Thea Alken, ED Scribe. 01/09/2015. 8:02 PM.  Patient ID: Jesse Baldwin, male    DOB: [birth year], 60 y.o.   MRN: [id number]  HPI  Chief Complaint  Patient presents with  . Annual Exam  . Sore Throat    x 2-3 days   HPI Comments: Acie Alavi is a 60 y.o. male who presents to the [facility] for a physical.  Pt has been doing well. No issues or complaints.  His last physical was 1 year ago.   Cancer screening He had colonoscopy 04/2014, repeat in 5 years. Last PSA: 1.3  Immunizations. Immunization History  Administered Date(s) Administered  . Influenza-Unspecified 10/28/2013  . Td 01/08/2010     Patient Active Problem List   Diagnosis Date Noted  . Hypertension 04/08/2011  . Hyperlipidemia 04/08/2011  . Hyperglycemia---trying to control with diet 04/08/2011  . Glaucoma 04/08/2011  . Hearing loss in right ear 04/08/2011   Past Medical History  Diagnosis Date  . Hypertension   . Hyperlipidemia   . Diabetes mellitus without complication [facility])    Past Surgical History  Procedure Laterality Date  . Cyst removal neck    . Cataract extraction Right   . Cholecystectomy  1990  . Carpal tunnel release Bilateral    Allergies  Allergen Reactions  . Septra [Bactrim] Nausea And Vomiting  . Sulfa Antibiotics Rash   Prior to Admission medications   Medication Sig Start Date End Date Taking? Authorizing Provider  amLODipine (NORVASC) 10 MG tablet Take 1 tablet (10 mg total) by mouth daily. 02/06/14  Yes Leandrew Koyanagi, MD  latanoprost (XALATAN) 0.005 % ophthalmic solution 1 drop at bedtime.   Yes Historical Provider, MD  rosuvastatin (CRESTOR) 20 MG tablet Take 1 tablet (20 mg total) by mouth daily. 02/06/14  Yes Leandrew Koyanagi, MD   Social History    Social History  . Marital Status: Married    Spouse Name: N/A  . Number of Children: N/A  . Years of Education: N/A   Occupational History  . Not on file.   Social History Main Topics  . Smoking status: Former Smoker    Quit date: 04/07/1981  . Smokeless tobacco: Never Used  . Alcohol Use: No  . Drug Use: No  . Sexual Activity: Yes    Birth Control/ Protection: None   Other Topics Concern  . Not on file   Social History Narrative   Review of Systems  Constitutional: Negative for fever and chills.  Gastrointestinal: Negative for abdominal pain.  Musculoskeletal: Negative for back pain and arthralgias.  rest of 14pt ros negative per form     Objective:   Physical Exam  Constitutional: He is oriented to person, place, and time. He appears well-developed and well-nourished. No distress.  HENT:  Head: Normocephalic and atraumatic.  Right Ear: Hearing, tympanic membrane, external ear and ear canal normal.  Left Ear: Hearing, tympanic membrane, external ear and ear canal normal.  Nose: Nose normal.  Mouth/Throat: Uvula is midline, oropharynx is clear and moist and mucous membranes are normal.  Eyes: Conjunctivae, EOM and lids are normal. Pupils are equal, round, and reactive to light. Right eye exhibits no discharge. Left eye exhibits no discharge. No scleral icterus.  Neck: Trachea normal and normal range of motion. Neck supple. Carotid bruit  is not present.  Cardiovascular: Normal rate, regular rhythm, normal heart sounds, intact distal pulses and normal pulses.  Exam reveals no gallop.   No murmur heard. Pulmonary/Chest: Effort normal and breath sounds normal. No respiratory distress. He has no wheezes. He has no rhonchi. He has no rales.  Abdominal: Soft. Normal appearance and bowel sounds are normal. He exhibits no abdominal bruit. There is no tenderness. There is no rebound and no guarding.  Genitourinary: Prostate normal.  Musculoskeletal: Normal range of motion. He  exhibits no edema or tenderness.  Lymphadenopathy:       Head (right side): No submental, no submandibular, no tonsillar, no preauricular, no posterior auricular and no occipital adenopathy present.       Head (left side): No submental, no submandibular, no tonsillar, no preauricular, no posterior auricular and no occipital adenopathy present.    He has no cervical adenopathy.  Neurological: He is alert and oriented to person, place, and time. He has normal strength and normal reflexes. No cranial nerve deficit or sensory deficit. Coordination and gait normal.  Skin: Skin is warm, dry and intact. No lesion and no rash noted.  Psychiatric: He has a normal mood and affect. His speech is normal and behavior is normal. Judgment and thought content normal.  Nursing note and vitals reviewed.  Filed Vitals:   01/09/15 1904  BP: 136/86  Pulse: 85  Temp: 99.1 F (37.3 C)  Resp: 16  Height: 5\' 3"  (1.6 m)  Weight: 149 lb (67.586 kg)  SpO2: 98%     Results for orders placed or performed in visit on 01/09/15  POCT glycosylated hemoglobin (Hb A1C)  Result Value Ref Range   Hemoglobin A1C 7.5    Assessment & Plan:   1. Essential hypertension   2. Hyperlipidemia   3. Hyperglycemia =DM 2   4. Annual physical exam   5. Screening for prostate cancer     Orders Placed This Encounter  Procedures  . CBC with Differential/Platelet  . Comprehensive metabolic panel  . Lipid panel  . PSA  . POCT glycosylated hemoglobin (Hb A1C)    Meds ordered this encounter  Medications  . amLODipine (NORVASC) 10 MG tablet    Sig: Take 1 tablet (10 mg total) by mouth daily.    Dispense:  90 tablet    Refill:  3  . rosuvastatin (CRESTOR) 20 MG tablet    Sig: Take 1 tablet (20 mg total) by mouth daily.    Dispense:  90 tablet    Refill:  3  . zoster vaccine live, PF, (ZOSTAVAX) 91478 UNT/0.65ML injection    Sig: Inject 19,400 Units into the skin once. Administer at pharmacy    Dispense:  1 each     Refill:  0  . metFORMIN (GLUCOPHAGE XR) 500 MG 24 hr tablet    Sig: Take 1 tablet (500 mg total) by mouth daily with breakfast.    Dispense:  90 tablet    Refill:  3  reck A1C in 3-4 months  I have completed the patient encounter in its entirety as documented by the scribe, with editing by me where necessary. Zuhayr Deeney P. Laney Pastor, M.D.

## 2015-01-10 LAB — CBC WITH DIFFERENTIAL/PLATELET
Basophils Absolute: 0.1 10*3/uL (ref 0.0–0.1)
Basophils Relative: 1 % (ref 0–1)
EOS ABS: 0.1 10*3/uL (ref 0.0–0.7)
EOS PCT: 1 % (ref 0–5)
HCT: 50 % (ref 39.0–52.0)
Hemoglobin: 16.6 g/dL (ref 13.0–17.0)
LYMPHS ABS: 2.6 10*3/uL (ref 0.7–4.0)
Lymphocytes Relative: 33 % (ref 12–46)
MCH: 29.3 pg (ref 26.0–34.0)
MCHC: 33.2 g/dL (ref 30.0–36.0)
MCV: 88.3 fL (ref 78.0–100.0)
MONO ABS: 0.6 10*3/uL (ref 0.1–1.0)
MONOS PCT: 8 % (ref 3–12)
MPV: 10.8 fL (ref 8.6–12.4)
Neutro Abs: 4.4 10*3/uL (ref 1.7–7.7)
Neutrophils Relative %: 57 % (ref 43–77)
PLATELETS: 194 10*3/uL (ref 150–400)
RBC: 5.66 MIL/uL (ref 4.22–5.81)
RDW: 14.2 % (ref 11.5–15.5)
WBC: 7.8 10*3/uL (ref 4.0–10.5)

## 2015-01-10 LAB — COMPREHENSIVE METABOLIC PANEL
ALT: 30 U/L (ref 9–46)
AST: 27 U/L (ref 10–35)
Albumin: 4 g/dL (ref 3.6–5.1)
Alkaline Phosphatase: 100 U/L (ref 40–115)
BUN: 13 mg/dL (ref 7–25)
CHLORIDE: 102 mmol/L (ref 98–110)
CO2: 28 mmol/L (ref 20–31)
Calcium: 9 mg/dL (ref 8.6–10.3)
Creat: 1.01 mg/dL (ref 0.70–1.25)
Glucose, Bld: 238 mg/dL — ABNORMAL HIGH (ref 65–99)
POTASSIUM: 3.8 mmol/L (ref 3.5–5.3)
Sodium: 137 mmol/L (ref 135–146)
TOTAL PROTEIN: 7.5 g/dL (ref 6.1–8.1)
Total Bilirubin: 0.6 mg/dL (ref 0.2–1.2)

## 2015-01-10 LAB — LIPID PANEL
CHOL/HDL RATIO: 6.1 ratio — AB (ref <=5.0)
CHOLESTEROL: 183 mg/dL (ref 125–200)
HDL: 30 mg/dL — ABNORMAL LOW (ref >=40)
LDL Cholesterol: 95 mg/dL (ref <130)
TRIGLYCERIDES: 291 mg/dL — AB (ref <150)
VLDL: 58 mg/dL — AB (ref <30)

## 2015-01-11 ENCOUNTER — Encounter: Payer: Self-pay | Admitting: Internal Medicine

## 2015-01-11 LAB — PSA: PSA: 1.37 ng/mL (ref <=4.00)

## 2015-10-04 ENCOUNTER — Ambulatory Visit: Payer: Federal, State, Local not specified - PPO

## 2015-11-06 ENCOUNTER — Ambulatory Visit (INDEPENDENT_AMBULATORY_CARE_PROVIDER_SITE_OTHER): Payer: Federal, State, Local not specified - PPO | Admitting: Family Medicine

## 2015-11-06 VITALS — BP 124/70 | HR 76 | Temp 97.8°F | Resp 18 | Ht 63.0 in | Wt 148.8 lb

## 2015-11-06 DIAGNOSIS — E781 Pure hyperglyceridemia: Secondary | ICD-10-CM

## 2015-11-06 DIAGNOSIS — Z23 Encounter for immunization: Secondary | ICD-10-CM | POA: Diagnosis not present

## 2015-11-06 DIAGNOSIS — E119 Type 2 diabetes mellitus without complications: Secondary | ICD-10-CM | POA: Diagnosis not present

## 2015-11-06 DIAGNOSIS — I10 Essential (primary) hypertension: Secondary | ICD-10-CM

## 2015-11-06 LAB — POCT URINALYSIS DIP (MANUAL ENTRY)
Bilirubin, UA: NEGATIVE
Glucose, UA: NEGATIVE
Ketones, POC UA: NEGATIVE
LEUKOCYTES UA: NEGATIVE
NITRITE UA: NEGATIVE
SPEC GRAV UA: 1.025
UROBILINOGEN UA: 0.2
pH, UA: 5.5

## 2015-11-06 LAB — CBC WITH DIFFERENTIAL/PLATELET
Basophils Absolute: 0 cells/uL (ref 0–200)
Basophils Relative: 0 %
EOS PCT: 1 %
Eosinophils Absolute: 104 cells/uL (ref 15–500)
HEMATOCRIT: 49.2 % (ref 38.5–50.0)
Hemoglobin: 16.3 g/dL (ref 13.2–17.1)
LYMPHS PCT: 25 %
Lymphs Abs: 2600 cells/uL (ref 850–3900)
MCH: 29.2 pg (ref 27.0–33.0)
MCHC: 33.1 g/dL (ref 32.0–36.0)
MCV: 88.2 fL (ref 80.0–100.0)
MONOS PCT: 7 %
MPV: 11.1 fL (ref 7.5–12.5)
Monocytes Absolute: 728 cells/uL (ref 200–950)
NEUTROS PCT: 67 %
Neutro Abs: 6968 cells/uL (ref 1500–7800)
Platelets: 203 10*3/uL (ref 140–400)
RBC: 5.58 MIL/uL (ref 4.20–5.80)
RDW: 14.3 % (ref 11.0–15.0)
WBC: 10.4 10*3/uL (ref 3.8–10.8)

## 2015-11-06 LAB — COMPREHENSIVE METABOLIC PANEL
ALT: 39 U/L (ref 9–46)
AST: 28 U/L (ref 10–35)
Albumin: 4.4 g/dL (ref 3.6–5.1)
Alkaline Phosphatase: 93 U/L (ref 40–115)
BILIRUBIN TOTAL: 0.6 mg/dL (ref 0.2–1.2)
BUN: 10 mg/dL (ref 7–25)
CALCIUM: 9.4 mg/dL (ref 8.6–10.3)
CO2: 27 mmol/L (ref 20–31)
Chloride: 106 mmol/L (ref 98–110)
Creat: 0.97 mg/dL (ref 0.70–1.25)
GLUCOSE: 146 mg/dL — AB (ref 65–99)
Potassium: 3.6 mmol/L (ref 3.5–5.3)
SODIUM: 142 mmol/L (ref 135–146)
Total Protein: 8.1 g/dL (ref 6.1–8.1)

## 2015-11-06 LAB — LIPID PANEL
CHOL/HDL RATIO: 3.9 ratio (ref <=5.0)
Cholesterol: 160 mg/dL (ref 125–200)
HDL: 41 mg/dL (ref >=40)
LDL CALC: 101 mg/dL (ref <130)
Triglycerides: 88 mg/dL (ref <150)
VLDL: 18 mg/dL (ref <30)

## 2015-11-06 LAB — POCT GLYCOSYLATED HEMOGLOBIN (HGB A1C): Hemoglobin A1C: 7.4

## 2015-11-06 LAB — MICROALBUMIN, URINE: MICROALB UR: 23.4 mg/dL

## 2015-11-06 LAB — TSH: TSH: 3.03 mIU/L (ref 0.40–4.50)

## 2015-11-06 LAB — GLUCOSE, POCT (MANUAL RESULT ENTRY): POC GLUCOSE: 143 mg/dL — AB (ref 70–99)

## 2015-11-06 MED ORDER — METFORMIN HCL ER 500 MG PO TB24: 500.0000 mg | ORAL_TABLET | Freq: Three times a day (TID) | ORAL | 3 refills | Status: DC

## 2015-11-06 MED ORDER — ROSUVASTATIN CALCIUM 20 MG PO TABS: 20.0000 mg | ORAL_TABLET | Freq: Every day | ORAL | 3 refills | Status: DC

## 2015-11-06 MED ORDER — AMLODIPINE BESYLATE 10 MG PO TABS: 10.0000 mg | ORAL_TABLET | Freq: Every day | ORAL | 3 refills | Status: DC

## 2015-11-06 NOTE — Progress Notes (Signed)
Subjective:    Patient ID: Jesse Baldwin, male    DOB: Mar 22, 1955, 60 y.o.   MRN: [id number]  11/06/2015  Establish Care (Formerly Dr. Ninfa Baldwin pt. )   HPI This 60 y.o. male presents to establish care.  Previous patient of Dr. Laney Baldwin.  Last visit 01/09/15 for CPE.  Dr. Laney Baldwin since 1996.  Followed every 3-6 months.     BP Readings from Last 3 Encounters:  11/06/15 124/70  01/09/15 136/86  11/07/14 120/70    Wt Readings from Last 3 Encounters:  11/06/15 148 lb 12.8 oz (67.5 kg)  01/09/15 149 lb (67.6 kg)  11/07/14 148 lb (67.1 kg)   HTN: Patient reports good compliance with medication, good tolerance to medication, and good symptom control.  Home BP twice monthly; running high.    DMII: diagnosed last year.  No nutrition referral.  Taking Metformin; recommend exercise and weight loss.  Has glucometer; checking sugar twice monthly; running 140.  Nocturia every 2 hours.  No dysuria.   Glaucoma: Jesse Baldwin; every six months.  Cough/congestion: onset in past week;  No fever/chills/sweats.  +sneezing; +rhinorrhea; +nasal congestion; no SOB.  Review of Systems  Constitutional: Negative for activity change, appetite change, chills, diaphoresis, fatigue, fever and unexpected weight change.  HENT: Positive for congestion, rhinorrhea and sneezing. Negative for dental problem, drooling, ear discharge, ear pain, facial swelling, hearing loss, mouth sores, nosebleeds, postnasal drip, sinus pressure, sore throat, tinnitus, trouble swallowing and voice change.   Eyes: Negative for photophobia, pain, discharge, redness, itching and visual disturbance.  Respiratory: Negative for apnea, cough, choking, chest tightness, shortness of breath, wheezing and stridor.   Cardiovascular: Negative for chest pain, palpitations and leg swelling.  Gastrointestinal: Negative for abdominal pain, blood in stool, constipation, diarrhea, nausea and vomiting.  Endocrine: Negative for cold intolerance, heat  intolerance, polydipsia, polyphagia and polyuria.  Genitourinary: Negative for decreased urine volume, difficulty urinating, discharge, dysuria, enuresis, flank pain, frequency, genital sores, hematuria, penile pain, penile swelling, scrotal swelling, testicular pain and urgency.  Musculoskeletal: Negative for arthralgias, back pain, gait problem, joint swelling, myalgias, neck pain and neck stiffness.  Skin: Negative for color change, pallor, rash and wound.  Allergic/Immunologic: Negative for environmental allergies, food allergies and immunocompromised state.  Neurological: Negative for dizziness, tremors, seizures, syncope, facial asymmetry, speech difficulty, weakness, light-headedness, numbness and headaches.  Hematological: Negative for adenopathy. Does not bruise/bleed easily.  Psychiatric/Behavioral: Negative for agitation, behavioral problems, confusion, decreased concentration, dysphoric mood, hallucinations, self-injury, sleep disturbance and suicidal ideas. The patient is not nervous/anxious and is not hyperactive.     Past Medical History:  Diagnosis Date  . Diabetes mellitus without complication ([place])   . Hyperlipidemia   . Hypertension    Past Surgical History:  Procedure Laterality Date  . CARPAL TUNNEL RELEASE Bilateral   . CATARACT EXTRACTION Right   . CHOLECYSTECTOMY  1990  . CYST REMOVAL NECK     Allergies  Allergen Reactions  . Septra [Bactrim] Nausea And Vomiting  . Sulfa Antibiotics Rash    Social History   Social History  . Marital status: Married    Spouse name: N/A  . Number of children: N/A  . Years of education: N/A   Occupational History  . Not on file.   Social History Main Topics  . Smoking status: Former Smoker    Quit date: 04/07/1981  . Smokeless tobacco: Never Used  . Alcohol use No  . Drug use: No  . Sexual activity: Yes    Birth control/  protection: None   Other Topics Concern  . 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      Alcohol: quit drinking 1998.      Drugs: none      Exercise: 3 days per week; walking.  Light weights.   Family History  Problem Relation Age of Onset  . Diabetes Mother   . Hypertension Mother   . Diabetes Father   . Hypertension Father   . Diabetes Sister   . Hypertension Sister   . Cancer Sister 50    bone marrow cancer  . Diabetes Brother   . Hypertension Brother   . Colon cancer Neg Hx        Objective:    BP 124/70   Pulse 76   Temp 97.8 F (36.6 C) (Oral)   Resp 18   Ht '5\' 3"'  (1.6 m)   Wt 148 lb 12.8 oz (67.5 kg)   SpO2 99%   BMI 26.36 kg/m  Physical Exam  Constitutional: He is oriented to person, place, and time. He appears well-developed and well-nourished. No distress.  HENT:  Head: Normocephalic and atraumatic.  Right Ear: External ear normal.  Left Ear: External ear normal.  Nose: Nose normal.  Mouth/Throat: Oropharynx is clear and moist.  Eyes: Conjunctivae and EOM are normal. Pupils are equal, round, and reactive to light.  Neck: Normal range of motion. Neck supple. Carotid bruit is not present. No thyromegaly present.  Cardiovascular: Normal rate, regular rhythm, normal heart sounds and intact distal pulses.  Exam reveals no gallop and no friction rub.   No murmur heard. Pulmonary/Chest: Effort normal and breath sounds normal. He has no wheezes. He has no rales.  Abdominal: Soft. Bowel sounds are normal. He exhibits no distension and no mass. There is no tenderness. There is no rebound and no guarding.  Musculoskeletal:       Right shoulder: Normal.       Left shoulder: Normal.       Cervical back: Normal.  Lymphadenopathy:    He has no cervical adenopathy.  Neurological: He is alert and oriented to  person, place, and time. He has normal reflexes. No cranial nerve deficit. He exhibits normal muscle tone. Coordination normal.  Skin: Skin is warm and dry. No rash noted. He is not diaphoretic.  Psychiatric: He has a normal mood and affect. His behavior is normal. Judgment and thought content normal.   Results for orders placed or performed in visit on 11/06/15  CBC with Differential/Platelet  Result Value Ref Range   WBC 10.4 3.8 - 10.8 K/uL   RBC 5.58 4.20 - 5.80 MIL/uL   Hemoglobin 16.3 13.2 - 17.1 g/dL   HCT 49.2 38.5 - 50.0 %   MCV 88.2 80.0 - 100.0 fL   MCH 29.2 27.0 - 33.0 pg   MCHC 33.1 32.0 - 36.0 g/dL   RDW 14.3 11.0 - 15.0 %   Platelets 203 140 - 400 K/uL   MPV 11.1 7.5 - 12.5 fL   Neutro Abs 6,968 1,500 - 7,800 cells/uL   Lymphs Abs 2,600 850 - 3,900 cells/uL   Monocytes Absolute 728 200 -  950 cells/uL   Eosinophils Absolute 104 15 - 500 cells/uL   Basophils Absolute 0 0 - 200 cells/uL   Neutrophils Relative % 67 %   Lymphocytes Relative 25 %   Monocytes Relative 7 %   Eosinophils Relative 1 %   Basophils Relative 0 %   Smear Review Criteria for review not met   Comprehensive metabolic panel  Result Value Ref Range   Sodium 142 135 - 146 mmol/L   Potassium 3.6 3.5 - 5.3 mmol/L   Chloride 106 98 - 110 mmol/L   CO2 27 20 - 31 mmol/L   Glucose, Bld 146 (H) 65 - 99 mg/dL   BUN 10 7 - 25 mg/dL   Creat 0.97 0.70 - 1.25 mg/dL   Total Bilirubin 0.6 0.2 - 1.2 mg/dL   Alkaline Phosphatase 93 40 - 115 U/L   AST 28 10 - 35 U/L   ALT 39 9 - 46 U/L   Total Protein 8.1 6.1 - 8.1 g/dL   Albumin 4.4 3.6 - 5.1 g/dL   Calcium 9.4 8.6 - 10.3 mg/dL  Lipid panel  Result Value Ref Range   Cholesterol 160 125 - 200 mg/dL   Triglycerides 88 <150 mg/dL   HDL 41 >=40 mg/dL   Total CHOL/HDL Ratio 3.9 <=5.0 Ratio   VLDL 18 <30 mg/dL   LDL Cholesterol 101 <130 mg/dL  Microalbumin, urine  Result Value Ref Range   Microalb, Ur 23.4 Not estab mg/dL  TSH  Result Value Ref Range    TSH 3.03 0.40 - 4.50 mIU/L  POCT urinalysis dipstick  Result Value Ref Range   Color, UA yellow yellow   Clarity, UA clear clear   Glucose, UA negative negative   Bilirubin, UA negative negative   Ketones, POC UA negative negative   Spec Grav, UA 1.025    Blood, UA moderate (A) negative   pH, UA 5.5    Protein Ur, POC =100 (A) negative   Urobilinogen, UA 0.2    Nitrite, UA Negative Negative   Leukocytes, UA Negative Negative  POCT glycosylated hemoglobin (Hb A1C)  Result Value Ref Range   Hemoglobin A1C 7.4   POCT glucose (manual entry)  Result Value Ref Range   POC Glucose 143 (A) 70 - 99 mg/dl       Assessment & Plan:   1. Type 2 diabetes mellitus without complication, without long-term current use of insulin ([place])   2. Essential hypertension   3. Pure hyperglyceridemia   4. Need for prophylactic vaccination against Streptococcus pneumoniae (pneumococcus)    -moderately controlled; increase Metformin to one tablet tid. Refer for diabetic education. -s/p Pneumovax. -obtain labs. -refills provided.   Orders Placed This Encounter  Procedures  . Pneumococcal polysaccharide vaccine 23-valent greater than or equal to 2yo subcutaneous/IM  . CBC with Differential/Platelet  . Comprehensive metabolic panel    Order Specific Question:   Has the patient fasted?    Answer:   Yes  . Lipid panel    Order Specific Question:   Has the patient fasted?    Answer:   Yes  . Microalbumin, urine  . TSH  . Ambulatory referral to diabetic education    Referral Priority:   Routine    Referral Type:   Consultation    Referral Reason:   Specialty Services Required    Number of Visits Requested:   1  . POCT urinalysis dipstick  . POCT glycosylated hemoglobin (Hb A1C)  . POCT glucose (manual entry)  .  HM Diabetes Foot Exam   Meds ordered this encounter  Medications  . metFORMIN (GLUCOPHAGE XR) 500 MG 24 hr tablet    Sig: Take 1 tablet (500 mg total) by mouth 3 (three) times daily  with meals.    Dispense:  270 tablet    Refill:  3  . amLODipine (NORVASC) 10 MG tablet    Sig: Take 1 tablet (10 mg total) by mouth daily.    Dispense:  90 tablet    Refill:  3  . rosuvastatin (CRESTOR) 20 MG tablet    Sig: Take 1 tablet (20 mg total) by mouth daily.    Dispense:  90 tablet    Refill:  3    Return in about 4 months (around 03/06/2016) for complete physical examiniation.   Recardo Linn Elayne Guerin, M.D. [facility] [address] [place], [place]  [id number] [phone] phone [phone] fax

## 2015-11-06 NOTE — Patient Instructions (Addendum)
   IF you received an x-ray today, you will receive an invoice from [facility]. Please contact [facility] at [phone] with questions or concerns regarding your invoice.   IF you received labwork today, you will receive an invoice from [facility]. Please contact [facility] at [phone] with questions or concerns regarding your invoice.   Our billing staff will not be able to assist you with questions regarding bills from these companies.  You will be contacted with the lab results as soon as they are available. The fastest way to get your results is to activate your My Chart account. Instructions are located on the last page of this paperwork. If you have not heard from us regarding the results in 2 weeks, please contact this office.     Basic Carbohydrate Counting for Diabetes Mellitus Carbohydrate counting is a method for keeping track of the amount of carbohydrates you eat. Eating carbohydrates naturally increases the level of sugar (glucose) in your blood, so it is important for you to know the amount that is okay for you to have in every meal. Carbohydrate counting helps keep the level of glucose in your blood within normal limits. The amount of carbohydrates allowed is different for every person. A dietitian can help you calculate the amount that is right for you. Once you know the amount of carbohydrates you can have, you can count the carbohydrates in the foods you want to eat. Carbohydrates are found in the following foods:  Grains, such as breads and cereals.  Dried beans and soy products.  Starchy vegetables, such as potatoes, peas, and corn.  Fruit and fruit juices.  Milk and yogurt.  Sweets and snack foods, such as cake, cookies, candy, chips, soft drinks, and fruit drinks. CARBOHYDRATE COUNTING There are two ways to count the carbohydrates in your food. You can use either of the methods or a combination of both. Reading  the "Nutrition Facts" on Packaged Food The "Nutrition Facts" is an area that is included on the labels of almost all packaged food and beverages in the [place]. It includes the serving size of that food or beverage and information about the nutrients in each serving of the food, including the grams (g) of carbohydrate per serving.  Decide the number of servings of this food or beverage that you will be able to eat or drink. Multiply that number of servings by the number of grams of carbohydrate that is listed on the label for that serving. The total will be the amount of carbohydrates you will be having when you eat or drink this food or beverage. Learning Standard Serving Sizes of Food When you eat food that is not packaged or does not include "Nutrition Facts" on the label, you need to measure the servings in order to count the amount of carbohydrates.A serving of most carbohydrate-rich foods contains about 15 g of carbohydrates. The following list includes serving sizes of carbohydrate-rich foods that provide 15 g ofcarbohydrate per serving:   1 slice of bread (1 oz) or 1 six-inch tortilla.    of a hamburger bun or English muffin.  4-6 crackers.   cup unsweetened dry cereal.    cup hot cereal.   cup rice or pasta.    cup mashed potatoes or  of a large baked potato.  1 cup fresh fruit or one small piece of fruit.    cup canned or frozen fruit or fruit juice.  1 cup milk.   cup   plain fat-free yogurt or yogurt sweetened with artificial sweeteners.   cup cooked dried beans or starchy vegetable, such as peas, corn, or potatoes.  Decide the number of standard-size servings that you will eat. Multiply that number of servings by 15 (the grams of carbohydrates in that serving). For example, if you eat 2 cups of strawberries, you will have eaten 2 servings and 30 g of carbohydrates (2 servings x 15 g = 30 g). For foods such as soups and casseroles, in which more than one  food is mixed in, you will need to count the carbohydrates in each food that is included. EXAMPLE OF CARBOHYDRATE COUNTING Sample Dinner  3 oz chicken breast.   cup of brown rice.   cup of corn.  1 cup milk.   1 cup strawberries with sugar-free whipped topping.  Carbohydrate Calculation Step 1: Identify the foods that contain carbohydrates:   Rice.   Corn.   Milk.   Strawberries. Step 2:Calculate the number of servings eaten of each:   2 servings of rice.   1 serving of corn.   1 serving of milk.   1 serving of strawberries. Step 3: Multiply each of those number of servings by 15 g:   2 servings of rice x 15 g = 30 g.   1 serving of corn x 15 g = 15 g.   1 serving of milk x 15 g = 15 g.   1 serving of strawberries x 15 g = 15 g. Step 4: Add together all of the amounts to find the total grams of carbohydrates eaten: 30 g + 15 g + 15 g + 15 g = 75 g.   This information is not intended to replace advice given to you by your health care provider. Make sure you discuss any questions you have with your health care provider.   Document Released: 12/24/2004 Document Revised: 01/14/2014 Document Reviewed: 11/20/2012 Elsevier Interactive Patient Education 2016 [employer]  

## 2015-12-11 ENCOUNTER — Encounter: Payer: Self-pay | Admitting: Skilled Nursing Facility1

## 2015-12-11 ENCOUNTER — Encounter: Payer: Federal, State, Local not specified - PPO | Attending: Family Medicine | Admitting: Skilled Nursing Facility1

## 2015-12-11 DIAGNOSIS — Z713 Dietary counseling and surveillance: Secondary | ICD-10-CM | POA: Diagnosis not present

## 2015-12-11 DIAGNOSIS — E119 Type 2 diabetes mellitus without complications: Secondary | ICD-10-CM | POA: Insufficient documentation

## 2015-12-11 NOTE — Patient Instructions (Addendum)
-  Try brown rice: maybe mix brown and white after they are done cooking  -get a yearly eye exam  -Before you eat anything in the morning: 80-130 -2 hours after you eat: 80-180  -Check the side of your finger   -Over 200 is a high number  -Under 70 is low  -Get yourself some measuring spoons and cups  -find something to do that is fun: find a hobby

## 2015-12-11 NOTE — Progress Notes (Signed)
Diabetes Self-Management Education  Visit Type: First/Initial  Appt. Start Time: 10:30 Appt. End Time: 11:30  12/11/2015  Mr. Jesse Baldwin, identified by name and date of birth, is a 60 y.o. male with a diagnosis of Diabetes: Type 2.   ASSESSMENT  Height 5\' 2"  (1.575 m), weight 150 lb 8 oz (68.3 kg). Body mass index is 27.53 kg/m. Pt states his sleep is terrible. Pt states he does not do anything but go to work and then home.      Diabetes Self-Management Education - 12/11/15 1039      Visit Information   Visit Type First/Initial     Initial Visit   Diabetes Type Type 2   Are you currently following a meal plan? No   Are you taking your medications as prescribed? Yes   Date Diagnosed one year ago     Health Coping   How would you rate your overall health? Good     Psychosocial Assessment   Patient Belief/Attitude about Diabetes Motivated to manage diabetes     Pre-Education Assessment   Patient understands the diabetes disease and treatment process. Needs Instruction   Patient understands incorporating nutritional management into lifestyle. Needs Instruction   Patient undertands incorporating physical activity into lifestyle. Needs Instruction   Patient understands using medications safely. Needs Instruction   Patient understands monitoring blood glucose, interpreting and using results Needs Instruction   Patient understands prevention, detection, and treatment of acute complications. Needs Instruction   Patient understands prevention, detection, and treatment of chronic complications. Needs Instruction   Patient understands how to develop strategies to address psychosocial issues. Needs Instruction   Patient understands how to develop strategies to promote health/change behavior. Needs Instruction     Complications   Last HgB A1C per patient/outside source 7.4 %   How often do you check your blood sugar? 0 times/day (not testing)   Have you had a dilated eye exam  in the past 12 months? No   Have you had a dental exam in the past 12 months? No   Are you checking your feet? No     Dietary Intake   Breakfast rice and eggs   Lunch rice and vegetables    Snack (afternoon) crackers   Dinner rice and vegetables   Beverage(s) water     Exercise   Exercise Type Light (walking / raking leaves)   How many days per week to you exercise? 3   How many minutes per day do you exercise? 30   Total minutes per week of exercise 90     Patient Education   Previous Diabetes Education No   Disease state  Definition of diabetes, type 1 and 2, and the diagnosis of diabetes   Nutrition management  Role of diet in the treatment of diabetes and the relationship between the three main macronutrients and blood glucose level;Food label reading, portion sizes and measuring food.;Carbohydrate counting;Reviewed blood glucose goals for pre and post meals and how to evaluate the patients' food intake on their blood glucose level.   Physical activity and exercise  Role of exercise on diabetes management, blood pressure control and cardiac health.   Monitoring Purpose and frequency of SMBG.;Yearly dilated eye exam;Daily foot exams;Identified appropriate SMBG and/or A1C goals.   Chronic complications Assessed and discussed foot care and prevention of foot problems;Retinopathy and reason for yearly dilated eye exams;Nephropathy, what it is, prevention of, the use of ACE, ARB's and early detection of through urine microalbumia.;Dental care;Lipid levels, blood  glucose control and heart disease     Individualized Goals (developed by patient)   Physical Activity Exercise 3-5 times per week;30 minutes per day     Post-Education Assessment   Patient understands the diabetes disease and treatment process. Demonstrates understanding / competency   Patient understands incorporating nutritional management into lifestyle. Demonstrates understanding / competency   Patient undertands  incorporating physical activity into lifestyle. Demonstrates understanding / competency   Patient understands using medications safely. Demonstrates understanding / competency   Patient understands monitoring blood glucose, interpreting and using results Demonstrates understanding / competency   Patient understands prevention, detection, and treatment of acute complications. Demonstrates understanding / competency   Patient understands prevention, detection, and treatment of chronic complications. Demonstrates understanding / competency   Patient understands how to develop strategies to address psychosocial issues. Demonstrates understanding / competency   Patient understands how to develop strategies to promote health/change behavior. Demonstrates understanding / competency     Outcomes   Expected Outcomes Demonstrated interest in learning. Expect positive outcomes   Future DMSE PRN   Program Status Completed      Individualized Plan for Diabetes Self-Management Training:   Learning Objective:  Patient will have a greater understanding of diabetes self-management. Patient education plan is to attend individual and/or group sessions per assessed needs and concerns.   Plan:   Patient Instructions  -Try brown rice: maybe mix brown and white after they are done cooking  -get a yearly eye exam  -Before you eat anything in the morning: 80-130 -2 hours after you eat: 80-180  -Check the side of your finger   -Over 200 is a high number  -Under 70 is low  -Get yourself some measuring spoons and cups  -find something to do that is fun: find a hobby   Expected Outcomes:  Demonstrated interest in learning. Expect positive outcomes  Education material provided: Living Well with Diabetes, Meal plan card, My Plate, Snack sheet and Support group flyer  If problems or questions, patient to contact team via:  Phone  Future DSME appointment: PRN

## 2015-12-18 DIAGNOSIS — G5601 Carpal tunnel syndrome, right upper limb: Secondary | ICD-10-CM | POA: Diagnosis not present

## 2015-12-18 DIAGNOSIS — G5602 Carpal tunnel syndrome, left upper limb: Secondary | ICD-10-CM | POA: Diagnosis not present

## 2015-12-25 DIAGNOSIS — H401131 Primary open-angle glaucoma, bilateral, mild stage: Secondary | ICD-10-CM | POA: Diagnosis not present

## 2016-01-25 ENCOUNTER — Other Ambulatory Visit: Payer: Self-pay | Admitting: Family Medicine

## 2016-02-27 ENCOUNTER — Encounter: Payer: Self-pay | Admitting: Family Medicine

## 2016-04-22 DIAGNOSIS — H10413 Chronic giant papillary conjunctivitis, bilateral: Secondary | ICD-10-CM | POA: Diagnosis not present

## 2016-04-22 DIAGNOSIS — H401131 Primary open-angle glaucoma, bilateral, mild stage: Secondary | ICD-10-CM | POA: Diagnosis not present

## 2016-04-22 DIAGNOSIS — E119 Type 2 diabetes mellitus without complications: Secondary | ICD-10-CM | POA: Diagnosis not present

## 2016-04-24 ENCOUNTER — Other Ambulatory Visit: Payer: Self-pay | Admitting: Family Medicine

## 2016-04-24 NOTE — Telephone Encounter (Signed)
Called pt to schedule an appointment phone not working and called spouse number that's either not on or couldn't leave a message

## 2016-04-24 NOTE — Telephone Encounter (Signed)
Call --- I only approved a 30 day supply of Metformin as patient is due for six month follow-up; please schedule OV to see me this month for diabetes, high blood pressure, high cholesterol follow-up.

## 2016-05-27 DIAGNOSIS — Z114 Encounter for screening for human immunodeficiency virus [HIV]: Secondary | ICD-10-CM | POA: Diagnosis not present

## 2016-05-27 DIAGNOSIS — I10 Essential (primary) hypertension: Secondary | ICD-10-CM | POA: Diagnosis not present

## 2016-05-27 DIAGNOSIS — Z1389 Encounter for screening for other disorder: Secondary | ICD-10-CM | POA: Diagnosis not present

## 2016-05-27 DIAGNOSIS — Z125 Encounter for screening for malignant neoplasm of prostate: Secondary | ICD-10-CM | POA: Diagnosis not present

## 2016-05-27 DIAGNOSIS — Z0000 Encounter for general adult medical examination without abnormal findings: Secondary | ICD-10-CM | POA: Diagnosis not present

## 2016-05-27 DIAGNOSIS — M1990 Unspecified osteoarthritis, unspecified site: Secondary | ICD-10-CM | POA: Diagnosis not present

## 2016-05-27 DIAGNOSIS — E782 Mixed hyperlipidemia: Secondary | ICD-10-CM | POA: Diagnosis not present

## 2016-06-17 DIAGNOSIS — Z7984 Long term (current) use of oral hypoglycemic drugs: Secondary | ICD-10-CM | POA: Diagnosis not present

## 2016-06-17 DIAGNOSIS — E119 Type 2 diabetes mellitus without complications: Secondary | ICD-10-CM | POA: Diagnosis not present

## 2016-06-24 DIAGNOSIS — E119 Type 2 diabetes mellitus without complications: Secondary | ICD-10-CM | POA: Diagnosis not present

## 2016-06-24 DIAGNOSIS — E782 Mixed hyperlipidemia: Secondary | ICD-10-CM | POA: Diagnosis not present

## 2016-06-24 DIAGNOSIS — I10 Essential (primary) hypertension: Secondary | ICD-10-CM | POA: Diagnosis not present

## 2016-06-24 DIAGNOSIS — Z7984 Long term (current) use of oral hypoglycemic drugs: Secondary | ICD-10-CM | POA: Diagnosis not present

## 2016-07-01 DIAGNOSIS — M1990 Unspecified osteoarthritis, unspecified site: Secondary | ICD-10-CM | POA: Diagnosis not present

## 2016-08-05 DIAGNOSIS — M25512 Pain in left shoulder: Secondary | ICD-10-CM | POA: Diagnosis not present

## 2016-08-05 DIAGNOSIS — M1990 Unspecified osteoarthritis, unspecified site: Secondary | ICD-10-CM | POA: Diagnosis not present

## 2016-08-07 DIAGNOSIS — M1990 Unspecified osteoarthritis, unspecified site: Secondary | ICD-10-CM | POA: Diagnosis not present

## 2016-08-07 DIAGNOSIS — M25512 Pain in left shoulder: Secondary | ICD-10-CM | POA: Diagnosis not present

## 2016-08-09 DIAGNOSIS — M25512 Pain in left shoulder: Secondary | ICD-10-CM | POA: Diagnosis not present

## 2016-08-09 DIAGNOSIS — M1990 Unspecified osteoarthritis, unspecified site: Secondary | ICD-10-CM | POA: Diagnosis not present

## 2016-08-19 DIAGNOSIS — M25512 Pain in left shoulder: Secondary | ICD-10-CM | POA: Diagnosis not present

## 2016-08-19 DIAGNOSIS — M9901 Segmental and somatic dysfunction of cervical region: Secondary | ICD-10-CM | POA: Diagnosis not present

## 2016-08-19 DIAGNOSIS — M9902 Segmental and somatic dysfunction of thoracic region: Secondary | ICD-10-CM | POA: Diagnosis not present

## 2016-08-19 DIAGNOSIS — M5386 Other specified dorsopathies, lumbar region: Secondary | ICD-10-CM | POA: Diagnosis not present

## 2016-08-19 DIAGNOSIS — M5136 Other intervertebral disc degeneration, lumbar region: Secondary | ICD-10-CM | POA: Diagnosis not present

## 2016-08-19 DIAGNOSIS — M5384 Other specified dorsopathies, thoracic region: Secondary | ICD-10-CM | POA: Diagnosis not present

## 2016-08-23 DIAGNOSIS — M25512 Pain in left shoulder: Secondary | ICD-10-CM | POA: Diagnosis not present

## 2016-08-23 DIAGNOSIS — M5384 Other specified dorsopathies, thoracic region: Secondary | ICD-10-CM | POA: Diagnosis not present

## 2016-08-23 DIAGNOSIS — M9901 Segmental and somatic dysfunction of cervical region: Secondary | ICD-10-CM | POA: Diagnosis not present

## 2016-08-23 DIAGNOSIS — M5136 Other intervertebral disc degeneration, lumbar region: Secondary | ICD-10-CM | POA: Diagnosis not present

## 2016-08-23 DIAGNOSIS — M5386 Other specified dorsopathies, lumbar region: Secondary | ICD-10-CM | POA: Diagnosis not present

## 2016-08-26 DIAGNOSIS — M5384 Other specified dorsopathies, thoracic region: Secondary | ICD-10-CM | POA: Diagnosis not present

## 2016-08-26 DIAGNOSIS — M5386 Other specified dorsopathies, lumbar region: Secondary | ICD-10-CM | POA: Diagnosis not present

## 2016-08-26 DIAGNOSIS — M25512 Pain in left shoulder: Secondary | ICD-10-CM | POA: Diagnosis not present

## 2016-08-26 DIAGNOSIS — M5136 Other intervertebral disc degeneration, lumbar region: Secondary | ICD-10-CM | POA: Diagnosis not present

## 2016-08-28 DIAGNOSIS — M5384 Other specified dorsopathies, thoracic region: Secondary | ICD-10-CM | POA: Diagnosis not present

## 2016-08-28 DIAGNOSIS — M25512 Pain in left shoulder: Secondary | ICD-10-CM | POA: Diagnosis not present

## 2016-08-28 DIAGNOSIS — M5136 Other intervertebral disc degeneration, lumbar region: Secondary | ICD-10-CM | POA: Diagnosis not present

## 2016-08-28 DIAGNOSIS — M5386 Other specified dorsopathies, lumbar region: Secondary | ICD-10-CM | POA: Diagnosis not present

## 2016-08-30 DIAGNOSIS — M5384 Other specified dorsopathies, thoracic region: Secondary | ICD-10-CM | POA: Diagnosis not present

## 2016-08-30 DIAGNOSIS — M25512 Pain in left shoulder: Secondary | ICD-10-CM | POA: Diagnosis not present

## 2016-08-30 DIAGNOSIS — M5386 Other specified dorsopathies, lumbar region: Secondary | ICD-10-CM | POA: Diagnosis not present

## 2016-08-30 DIAGNOSIS — M5136 Other intervertebral disc degeneration, lumbar region: Secondary | ICD-10-CM | POA: Diagnosis not present

## 2016-09-02 DIAGNOSIS — E119 Type 2 diabetes mellitus without complications: Secondary | ICD-10-CM | POA: Diagnosis not present

## 2016-09-02 DIAGNOSIS — M5384 Other specified dorsopathies, thoracic region: Secondary | ICD-10-CM | POA: Diagnosis not present

## 2016-09-02 DIAGNOSIS — M5386 Other specified dorsopathies, lumbar region: Secondary | ICD-10-CM | POA: Diagnosis not present

## 2016-09-02 DIAGNOSIS — M25512 Pain in left shoulder: Secondary | ICD-10-CM | POA: Diagnosis not present

## 2016-09-02 DIAGNOSIS — M5136 Other intervertebral disc degeneration, lumbar region: Secondary | ICD-10-CM | POA: Diagnosis not present

## 2016-09-02 DIAGNOSIS — H401131 Primary open-angle glaucoma, bilateral, mild stage: Secondary | ICD-10-CM | POA: Diagnosis not present

## 2016-09-02 DIAGNOSIS — H10413 Chronic giant papillary conjunctivitis, bilateral: Secondary | ICD-10-CM | POA: Diagnosis not present

## 2016-09-04 DIAGNOSIS — M25521 Pain in right elbow: Secondary | ICD-10-CM | POA: Diagnosis not present

## 2016-09-04 DIAGNOSIS — M5136 Other intervertebral disc degeneration, lumbar region: Secondary | ICD-10-CM | POA: Diagnosis not present

## 2016-09-04 DIAGNOSIS — M5386 Other specified dorsopathies, lumbar region: Secondary | ICD-10-CM | POA: Diagnosis not present

## 2016-09-04 DIAGNOSIS — M5384 Other specified dorsopathies, thoracic region: Secondary | ICD-10-CM | POA: Diagnosis not present

## 2016-09-10 DIAGNOSIS — M5384 Other specified dorsopathies, thoracic region: Secondary | ICD-10-CM | POA: Diagnosis not present

## 2016-09-10 DIAGNOSIS — M25512 Pain in left shoulder: Secondary | ICD-10-CM | POA: Diagnosis not present

## 2016-09-10 DIAGNOSIS — M5136 Other intervertebral disc degeneration, lumbar region: Secondary | ICD-10-CM | POA: Diagnosis not present

## 2016-09-10 DIAGNOSIS — M5386 Other specified dorsopathies, lumbar region: Secondary | ICD-10-CM | POA: Diagnosis not present

## 2016-09-11 DIAGNOSIS — M5384 Other specified dorsopathies, thoracic region: Secondary | ICD-10-CM | POA: Diagnosis not present

## 2016-09-11 DIAGNOSIS — M5386 Other specified dorsopathies, lumbar region: Secondary | ICD-10-CM | POA: Diagnosis not present

## 2016-09-11 DIAGNOSIS — M25521 Pain in right elbow: Secondary | ICD-10-CM | POA: Diagnosis not present

## 2016-09-11 DIAGNOSIS — M5136 Other intervertebral disc degeneration, lumbar region: Secondary | ICD-10-CM | POA: Diagnosis not present

## 2016-09-16 DIAGNOSIS — M5384 Other specified dorsopathies, thoracic region: Secondary | ICD-10-CM | POA: Diagnosis not present

## 2016-09-16 DIAGNOSIS — M25521 Pain in right elbow: Secondary | ICD-10-CM | POA: Diagnosis not present

## 2016-09-16 DIAGNOSIS — M5136 Other intervertebral disc degeneration, lumbar region: Secondary | ICD-10-CM | POA: Diagnosis not present

## 2016-09-16 DIAGNOSIS — M5386 Other specified dorsopathies, lumbar region: Secondary | ICD-10-CM | POA: Diagnosis not present

## 2016-10-31 DIAGNOSIS — M7550 Bursitis of unspecified shoulder: Secondary | ICD-10-CM | POA: Diagnosis not present

## 2016-10-31 DIAGNOSIS — Z79899 Other long term (current) drug therapy: Secondary | ICD-10-CM | POA: Diagnosis not present

## 2016-11-05 ENCOUNTER — Other Ambulatory Visit: Payer: Self-pay | Admitting: Family Medicine

## 2016-11-05 DIAGNOSIS — G5602 Carpal tunnel syndrome, left upper limb: Secondary | ICD-10-CM | POA: Diagnosis not present

## 2016-11-05 DIAGNOSIS — G5601 Carpal tunnel syndrome, right upper limb: Secondary | ICD-10-CM | POA: Diagnosis not present

## 2016-11-21 ENCOUNTER — Ambulatory Visit (INDEPENDENT_AMBULATORY_CARE_PROVIDER_SITE_OTHER): Payer: Federal, State, Local not specified - PPO | Admitting: Orthopedic Surgery

## 2016-11-21 ENCOUNTER — Encounter (INDEPENDENT_AMBULATORY_CARE_PROVIDER_SITE_OTHER): Payer: Self-pay | Admitting: Orthopedic Surgery

## 2016-11-21 DIAGNOSIS — M7712 Lateral epicondylitis, left elbow: Secondary | ICD-10-CM

## 2016-11-21 DIAGNOSIS — M7542 Impingement syndrome of left shoulder: Secondary | ICD-10-CM | POA: Diagnosis not present

## 2016-11-21 NOTE — Progress Notes (Signed)
Office Visit Note   Patient: Jesse Baldwin           Date of Birth: [birth year]           MRN: [id number] Visit Date: 11/21/2016              Requested by: Wardell Honour, MD [address] [place], [place] [address] PCP: Wardell Honour, MD  Chief Complaint  Patient presents with  . Left Shoulder - Pain  . Left Wrist - Pain  . Right Wrist - Pain      HPI: Patient is a 61 year old gentleman who presents complaining of dorsal left forearm pain as well as anterior lateral left shoulder pain.  Patient is status post carpal tunnel release by Dr. Marveen Reeks on both the left and right wrist approximately 7-8 years ago.  Patient states he has pain radiating from the lateral condyle down to the dorsum of his wrist.  Patient also complains of impingement symptoms of the left shoulder.  He states he has decreased range of motion he has been to [facility] had a radiograph of his thoracic spine and states that they're manipulation of his shoulder provided him no relief.  Patient states that he works at the [employer] there coming up on the holiday season and he states he can no longer lift the heavy boxes and magazines that is required to lift at work.  Assessment & Plan: Visit Diagnoses:  1. Lateral epicondylitis of left elbow   2. Impingement syndrome of left shoulder     Plan: Discussed the patient that he does have lateral epicondylitis of the left elbow and impingement syndrome of the left shoulder.  Discussed that we could proceed with injections to help with his symptoms.  Patient states that he has had injections for both the shoulder and elbow before and he is not interested in injections at this time.  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  Patient is given a note to be out of work for 4 weeks.  Follow-Up Instructions: Return if symptoms  worsen or fail to improve.   Ortho Exam  Patient is alert, oriented, no adenopathy, well-dressed, normal affect, normal respiratory effort. Examination patient has a negative Phalen's negative Tinel's test at the wrist bilaterally.  He has an excellent grip strength bilaterally with no weakness.  Resisted extension of the wrist on the left reproduces lateral epicondyle pain on the left.  He is tender to palpation at the lateral epicondyle left elbow the remainder the elbow exam is negative.  Examination of the left shoulder he has abduction and flexion to 90 degrees.  He is tender to palpation over the biceps tendon.  He has pain with Neer and Hawkins impingement test pain with a drop arm test.  Imaging: No results found. No images are attached to the encounter.  Labs: Lab Results  Component Value Date   HGBA1C 7.4 11/06/2015   HGBA1C 7.5 01/09/2015   HGBA1C 7.2 11/07/2014    Orders:  No orders of the defined types were placed in this encounter.  No orders of the defined types were placed in this encounter.    Procedures: No procedures performed  Clinical Data: No additional findings.  ROS:  All other systems negative, except as noted in the HPI. Review of Systems  Objective:  Vital Signs: There were no vitals taken for this visit.  Specialty Comments:  No specialty comments available.  PMFS History: Patient Active Problem List   Diagnosis Date Noted  . Lateral epicondylitis of left elbow 11/21/2016  . Impingement syndrome of left shoulder 11/21/2016  . Type 2 diabetes mellitus without complication, without long-term current use of insulin ([place]) 01/09/2015  . Hypertension 04/08/2011  . Hyperlipidemia 04/08/2011  . Glaucoma 04/08/2011  . Hearing loss in right ear 04/08/2011   Past Medical History:  Diagnosis Date  . Diabetes mellitus without complication ([facility])   . Hyperlipidemia   . Hypertension     Family History  Problem Relation Age of Onset  . Diabetes  Mother   . Hypertension Mother   . Diabetes Father   . Hypertension Father   . Diabetes Sister   . Hypertension Sister   . Cancer Sister 50       bone marrow cancer  . Diabetes Brother   . Hypertension Brother   . Colon cancer Neg Hx     Past Surgical History:  Procedure Laterality Date  . CARPAL TUNNEL RELEASE Bilateral   . CATARACT EXTRACTION Right   . CHOLECYSTECTOMY  1990  . CYST REMOVAL NECK     Social History   Occupational History  . Not on file  Tobacco Use  . Smoking status: Former Smoker    Last attempt to quit: 04/07/1981    Years since quitting: 35.6  . Smokeless tobacco: Never Used  Substance and Sexual Activity  . Alcohol use: No  . Drug use: No  . Sexual activity: Yes    Birth control/protection: None

## 2016-11-25 ENCOUNTER — Telehealth (INDEPENDENT_AMBULATORY_CARE_PROVIDER_SITE_OTHER): Payer: Self-pay | Admitting: Orthopedic Surgery

## 2016-11-25 NOTE — Telephone Encounter (Signed)
Patient cam in the office today stating that the note Dr. Sharol Given wrote on the prescription pad was not sufficient for his employer.  He stated that it needs to be on office letterhead stating the reason why the patient needs to be out of work for 4 weeks.  CB#[phone].  Thank you.

## 2016-11-26 NOTE — Telephone Encounter (Signed)
Called and spoke with patient to advise letter at front desk for pick up.

## 2016-12-19 ENCOUNTER — Ambulatory Visit (INDEPENDENT_AMBULATORY_CARE_PROVIDER_SITE_OTHER): Payer: Federal, State, Local not specified - PPO | Admitting: Orthopedic Surgery

## 2016-12-22 ENCOUNTER — Other Ambulatory Visit: Payer: Self-pay | Admitting: Family Medicine

## 2017-01-06 ENCOUNTER — Encounter (INDEPENDENT_AMBULATORY_CARE_PROVIDER_SITE_OTHER): Payer: Self-pay | Admitting: Orthopedic Surgery

## 2017-01-06 ENCOUNTER — Ambulatory Visit (INDEPENDENT_AMBULATORY_CARE_PROVIDER_SITE_OTHER): Payer: Federal, State, Local not specified - PPO | Admitting: Orthopedic Surgery

## 2017-01-06 DIAGNOSIS — M7712 Lateral epicondylitis, left elbow: Secondary | ICD-10-CM | POA: Diagnosis not present

## 2017-01-06 DIAGNOSIS — M7542 Impingement syndrome of left shoulder: Secondary | ICD-10-CM

## 2017-01-06 DIAGNOSIS — H10413 Chronic giant papillary conjunctivitis, bilateral: Secondary | ICD-10-CM | POA: Diagnosis not present

## 2017-01-06 DIAGNOSIS — H401131 Primary open-angle glaucoma, bilateral, mild stage: Secondary | ICD-10-CM | POA: Diagnosis not present

## 2017-01-06 DIAGNOSIS — M79642 Pain in left hand: Secondary | ICD-10-CM | POA: Diagnosis not present

## 2017-01-06 DIAGNOSIS — E119 Type 2 diabetes mellitus without complications: Secondary | ICD-10-CM | POA: Diagnosis not present

## 2017-01-06 NOTE — Progress Notes (Signed)
Office Visit Note   Patient: Jesse Baldwin           Date of Birth: August 19, 1955           MRN: [id number] Visit Date: 01/06/2017              Requested by: Wardell Honour, MD [address] [place], [place] [address] PCP: Wardell Honour, MD  Chief Complaint  Patient presents with  . Right Hand - Follow-up  . Left Hand - Follow-up  . Left Shoulder - Pain  . Left Elbow - Pain      HPI: Patient is a 61 year old gentleman who presents complaining of persistent left shoulder pain lateral epicondyle pain on the left as well as global hand pain bilaterally.  Patient states he was out of work for 4 weeks felt fine went back to work for 2 weeks and is now been having some increasing pain.  Patient states he would like to be out of work for another several weeks.  Assessment & Plan: Visit Diagnoses:  1. Lateral epicondylitis of left elbow   2. Impingement syndrome of left shoulder   3. Pain in left hand     Plan: Discussed with the patient that my goal would be to treat any problems that he has and that thank you out of work is not solving the problem or treating the problem.  Discussed that we could write him out for 2 weeks and if he is still symptomatic at follow-up we would have to consider some type of treatment other than just taking him out of work.  Patient does not have any rheumatologic signs or symptoms.  Follow-Up Instructions: Return if symptoms worsen or fail to improve.   Ortho Exam  Patient is alert, oriented, no adenopathy, well-dressed, normal affect, normal respiratory effort. Examination patient has a normal gait.  His scaphoid scapholunate and TFCC are nontender to palpation bilaterally.  He has full range of motion of his fingers and wrist.  The base of the thumb is nontender to palpation stress of the MCP joint of the thumb is nontender no laxity.  Examination left elbow is tender to palpation of lateral epicondyle resisted extension of the wrist reproduces pain.   Left shoulder shows full range of motion with pain to palpation over the [facility] joint no pain to palpation of the biceps tendon Neer and Hawkins impingement tests are painful.  Patient has no ulnar deviation in either hand no Heberden's or Bouchard's nodes there is no swelling in either hand.  Imaging: No results found. No images are attached to the encounter.  Labs: Lab Results  Component Value Date   HGBA1C 7.4 11/06/2015   HGBA1C 7.5 01/09/2015   HGBA1C 7.2 11/07/2014    @LABSALLVALUES (HGBA1)@  There is no height or weight on file to calculate BMI.  Orders:  No orders of the defined types were placed in this encounter.  No orders of the defined types were placed in this encounter.    Procedures: No procedures performed  Clinical Data: No additional findings.  ROS:  All other systems negative, except as noted in the HPI. Review of Systems  Objective: Vital Signs: There were no vitals taken for this visit.  Specialty Comments:  No specialty comments available.  PMFS History: Patient Active Problem List   Diagnosis Date Noted  . Lateral epicondylitis of left elbow 11/21/2016  . Impingement syndrome of left shoulder 11/21/2016  . Type 2 diabetes mellitus without complication, without long-term current  use of insulin ([place]) 01/09/2015  . Hypertension 04/08/2011  . Hyperlipidemia 04/08/2011  . Glaucoma 04/08/2011  . Hearing loss in right ear 04/08/2011   Past Medical History:  Diagnosis Date  . Diabetes mellitus without complication ([place])   . Hyperlipidemia   . Hypertension     Family History  Problem Relation Age of Onset  . Diabetes Mother   . Hypertension Mother   . Diabetes Father   . Hypertension Father   . Diabetes Sister   . Hypertension Sister   . Cancer Sister 50       bone marrow cancer  . Diabetes Brother   . Hypertension Brother   . Colon cancer Neg Hx     Past Surgical History:  Procedure Laterality Date  . CARPAL TUNNEL RELEASE Bilateral    . CATARACT EXTRACTION Right   . CHOLECYSTECTOMY  1990  . CYST REMOVAL NECK     Social History   Occupational History  . Not on file  Tobacco Use  . Smoking status: Former Smoker    Last attempt to quit: 04/07/1981    Years since quitting: 35.7  . Smokeless tobacco: Never Used  Substance and Sexual Activity  . Alcohol use: No  . Drug use: No  . Sexual activity: Yes    Birth control/protection: None

## 2017-01-08 ENCOUNTER — Other Ambulatory Visit (INDEPENDENT_AMBULATORY_CARE_PROVIDER_SITE_OTHER): Payer: Self-pay

## 2017-01-19 ENCOUNTER — Other Ambulatory Visit: Payer: Self-pay | Admitting: Family Medicine

## 2017-02-13 DIAGNOSIS — M79641 Pain in right hand: Secondary | ICD-10-CM | POA: Diagnosis not present

## 2017-02-13 DIAGNOSIS — M79642 Pain in left hand: Secondary | ICD-10-CM | POA: Diagnosis not present

## 2017-03-12 ENCOUNTER — Other Ambulatory Visit: Payer: Self-pay

## 2017-03-12 ENCOUNTER — Encounter: Payer: Self-pay | Admitting: Family Medicine

## 2017-03-12 ENCOUNTER — Ambulatory Visit: Payer: Federal, State, Local not specified - PPO | Admitting: Family Medicine

## 2017-03-12 VITALS — BP 126/70 | HR 85 | Temp 98.0°F | Resp 16 | Ht 62.21 in | Wt 146.0 lb

## 2017-03-12 DIAGNOSIS — Z6826 Body mass index (BMI) 26.0-26.9, adult: Secondary | ICD-10-CM | POA: Diagnosis not present

## 2017-03-12 DIAGNOSIS — Z125 Encounter for screening for malignant neoplasm of prostate: Secondary | ICD-10-CM

## 2017-03-12 DIAGNOSIS — I10 Essential (primary) hypertension: Secondary | ICD-10-CM

## 2017-03-12 DIAGNOSIS — E119 Type 2 diabetes mellitus without complications: Secondary | ICD-10-CM | POA: Diagnosis not present

## 2017-03-12 DIAGNOSIS — E781 Pure hyperglyceridemia: Secondary | ICD-10-CM

## 2017-03-12 LAB — POCT GLYCOSYLATED HEMOGLOBIN (HGB A1C): HEMOGLOBIN A1C: 9.3

## 2017-03-12 LAB — GLUCOSE, POCT (MANUAL RESULT ENTRY): POC Glucose: 120 mg/dl — AB (ref 70–99)

## 2017-03-12 MED ORDER — METFORMIN HCL ER 500 MG PO TB24: 1000.0000 mg | ORAL_TABLET | Freq: Every day | ORAL | 1 refills | Status: DC

## 2017-03-12 MED ORDER — AMLODIPINE BESYLATE 10 MG PO TABS: 10.0000 mg | ORAL_TABLET | Freq: Every day | ORAL | 1 refills | Status: DC

## 2017-03-12 NOTE — Patient Instructions (Addendum)
IF you received an x-ray today, you will receive an invoice from [facility]. Please contact [facility] at [phone] with questions or concerns regarding your invoice.   IF you received labwork today, you will receive an invoice from [facility]. Please contact [facility] at [phone] with questions or concerns regarding your invoice.   Our billing staff will not be able to assist you with questions regarding bills from these companies.  You will be contacted with the lab results as soon as they are available. The fastest way to get your results is to activate your My Chart account. Instructions are located on the last page of this paperwork. If you have not heard from [place] regarding the results in 2 weeks, please contact this office.      Diabetes and Foot Care Diabetes may cause you to have problems because of poor blood supply (circulation) to your feet and legs. This may cause the skin on your feet to become thinner, break easier, and heal more slowly. Your skin may become dry, and the skin may peel and crack. You may also have nerve damage in your legs and feet causing decreased feeling in them. You may not notice minor injuries to your feet that could lead to infections or more serious problems. Taking care of your feet is one of the most important things you can do for yourself. Follow these instructions at home:  Wear shoes at all times, even in the house. Do not go barefoot. Bare feet are easily injured.  Check your feet daily for blisters, cuts, and redness. If you cannot see the bottom of your feet, use a mirror or ask someone for help.  Wash your feet with warm water (do not use hot water) and mild soap. Then pat your feet and the areas between your toes until they are completely dry. Do not soak your feet as this can dry your skin.  Apply a moisturizing lotion or petroleum jelly (that does not contain alcohol and is unscented) to the skin on your feet and  to dry, brittle toenails. Do not apply lotion between your toes.  Trim your toenails straight across. Do not dig under them or around the cuticle. File the edges of your nails with an emery board or nail file.  Do not cut corns or calluses or try to remove them with medicine.  Wear clean socks or stockings every day. Make sure they are not too tight. Do not wear knee-high stockings since they may decrease blood flow to your legs.  Wear shoes that fit properly and have enough cushioning. To break in new shoes, wear them for just a few hours a day. This prevents you from injuring your feet. Always look in your shoes before you put them on to be sure there are no objects inside.  Do not cross your legs. This may decrease the blood flow to your feet.  If you find a minor scrape, cut, or break in the skin on your feet, keep it and the skin around it clean and dry. These areas may be cleansed with mild soap and water. Do not cleanse the area with peroxide, alcohol, or iodine.  When you remove an adhesive bandage, be sure not to damage the skin around it.  If you have a wound, look at it several times a day to make sure it is healing.  Do not use heating pads or hot water bottles. They may burn your skin. If you have lost  feeling in your feet or legs, you may not know it is happening until it is too late.  Make sure your health care provider performs a complete foot exam at least annually or more often if you have foot problems. Report any cuts, sores, or bruises to your health care provider immediately. Contact a health care provider if:  You have an injury that is not healing.  You have cuts or breaks in the skin.  You have an ingrown nail.  You notice redness on your legs or feet.  You feel burning or tingling in your legs or feet.  You have pain or cramps in your legs and feet.  Your legs or feet are numb.  Your feet always feel cold. Get help right away if:  There is increasing  redness, swelling, or pain in or around a wound.  There is a red line that goes up your leg.  Pus is coming from a wound.  You develop a fever or as directed by your health care provider.  You notice a bad smell coming from an ulcer or wound. This information is not intended to replace advice given to you by your health care provider. Make sure you discuss any questions you have with your health care provider. Document Released: 12/22/1999 Document Revised: 06/01/2015 Document Reviewed: 06/02/2012 Elsevier Interactive Patient Education  2017 [employer]  

## 2017-03-12 NOTE — Progress Notes (Signed)
Subjective:    Patient ID: Jesse Baldwin, male    DOB: [birth year], 62 y.o.   MRN: [id number]  03/12/2017  Chronic Conditions    HPI This 62 y.o. male presents for eighteen month follow-up of DMII, hypertension, hypercholesterolemia.  Management changes made at last visit include increasing Metformin to tid and referral to nutritionist.  Sugars running high at home; eating too much.  Blood pressure is stable.      BP Readings from Last 3 Encounters:  03/12/17 126/70  11/06/15 124/70  01/09/15 136/86   Wt Readings from Last 3 Encounters:  03/12/17 146 lb (66.2 kg)  12/11/15 150 lb 8 oz (68.3 kg)  11/06/15 148 lb 12.8 oz (67.5 kg)   Immunization History  Administered Date(s) Administered  . Influenza-Unspecified 10/28/2013, 10/16/2015, 10/07/2016  . Pneumococcal Polysaccharide-23 11/06/2015  . Td 01/08/2010    Review of Systems  Constitutional: Negative for activity change, appetite change, chills, diaphoresis, fatigue and fever.  Respiratory: Negative for cough and shortness of breath.   Cardiovascular: Negative for chest pain, palpitations and leg swelling.  Gastrointestinal: Negative for abdominal pain, diarrhea, nausea and vomiting.  Endocrine: Negative for cold intolerance, heat intolerance, polydipsia, polyphagia and polyuria.  Skin: Negative for color change, rash and wound.  Neurological: Negative for dizziness, tremors, seizures, syncope, facial asymmetry, speech difficulty, weakness, light-headedness, numbness and headaches.  Psychiatric/Behavioral: Negative for dysphoric mood and sleep disturbance. The patient is not nervous/anxious.     Past Medical History:  Diagnosis Date  . Diabetes mellitus without complication ([place])   . Hyperlipidemia   . Hypertension    Past Surgical History:  Procedure Laterality Date  . CARPAL TUNNEL RELEASE Bilateral   . CATARACT EXTRACTION Right   . CHOLECYSTECTOMY  1990  . CYST REMOVAL NECK     Allergies  Allergen  Reactions  . Septra [Bactrim] Nausea And Vomiting  . Sulfa Antibiotics Rash   Current Outpatient Medications on File Prior to Visit  Medication Sig Dispense Refill  . latanoprost (XALATAN) 0.005 % ophthalmic solution 1 drop at bedtime.    . rosuvastatin (CRESTOR) 20 MG tablet TAKE 1 TABLET BY MOUTH DAILY 90 tablet 3   No current facility-administered medications on file prior to visit.    Social History   Socioeconomic History  . Marital status: Married    Spouse name: Not on file  . Number of children: Not on file  . Years of education: Not on file  . Highest education level: Not on file  Occupational History  . Not on file  Social Needs  . Financial resource strain: Not on file  . Food insecurity:    Worry: Not on file    Inability: Not on file  . Transportation needs:    Medical: Not on file    Non-medical: Not on file  Tobacco Use  . Smoking status: Former Smoker    Last attempt to quit: 04/07/1981    Years since quitting: 36.0  . Smokeless tobacco: Never Used  Substance and Sexual Activity  . Alcohol use: No  . Drug use: No  . Sexual activity: Yes    Birth control/protection: None  Lifestyle  . Physical activity:    Days per week: Not on file    Minutes per session: Not on file  . Stress: Not on file  Relationships  . Social connections:    Talks on phone: Not on file    Gets together: Not on file    Attends religious service: Not on  file    Active member of club or organization: Not on file    Attends meetings of clubs or organizations: Not on file    Relationship status: Not on file  . Intimate partner violence:    Fear of current or ex partner: Not on file    Emotionally abused: Not on file    Physically abused: Not on file    Forced sexual activity: Not on file  Other Topics Concern  . 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      Alcohol: quit drinking 1998.      Drugs: none      Exercise: 3 days per week; walking.  Light weights.   Family History  Problem Relation Age of Onset  . Diabetes Mother   . Hypertension Mother   . Diabetes Father   . Hypertension Father   . Diabetes Sister   . Hypertension Sister   . Cancer Sister 50       bone marrow cancer  . Diabetes Brother   . Hypertension Brother   . Colon cancer Neg Hx        Objective:    BP 126/70   Pulse 85   Temp 98 F (36.7 C) (Oral)   Resp 16   Ht 5' 2.21" (1.58 m)   Wt 146 lb (66.2 kg)   SpO2 96%   BMI 26.53 kg/m  Physical Exam  Constitutional: He is oriented to person, place, and time. He appears well-developed and well-nourished. No distress.  HENT:  Head: Normocephalic and atraumatic.  Right Ear: External ear normal.  Left Ear: External ear normal.  Nose: Nose normal.  Mouth/Throat: Oropharynx is clear and moist.  Eyes: Conjunctivae and EOM are normal. Pupils are equal, round, and reactive to light.  Neck: Normal range of motion. Neck supple. Carotid bruit is not present. No thyromegaly present.  Cardiovascular: Normal rate, regular rhythm, normal heart sounds and intact distal pulses. Exam reveals no gallop and no friction rub.  No murmur heard. Pulmonary/Chest: Effort normal and breath sounds normal. He has no wheezes. He has no rales.  Abdominal: Soft. Bowel sounds are normal. He exhibits no distension and no mass. There is no tenderness. There is no rebound and no guarding.  Lymphadenopathy:    He has no cervical adenopathy.  Neurological: He is alert and oriented to person, place, and time. No cranial nerve deficit.  Skin: Skin is warm and dry. No rash noted. He is not diaphoretic.  Psychiatric: He has a normal mood and affect. His behavior is normal.  Nursing note and vitals reviewed.  No  results found. Depression screen [facility] 2/9 03/12/2017 11/06/2015 01/09/2015 11/07/2014  Decreased Interest 0 0 0 0  Down, Depressed, Hopeless 0 0 0 0  PHQ - 2 Score 0 0 0 0   Fall Risk  03/12/2017 11/06/2015  Falls in the past year? No No    Results for orders placed or performed in visit on 03/12/17  Comprehensive metabolic panel  Result Value Ref Range   Glucose 122 (H) 65 - 99 mg/dL   BUN 15 8 - 27 mg/dL   Creatinine, Ser  1.23 0.76 - 1.27 mg/dL   GFR calc non Af Amer 62 >59 mL/min/1.73   GFR calc Af Amer 72 >59 mL/min/1.73   BUN/Creatinine Ratio 12 10 - 24   Sodium 141 134 - 144 mmol/L   Potassium 4.2 3.5 - 5.2 mmol/L   Chloride 99 96 - 106 mmol/L   CO2 25 20 - 29 mmol/L   Calcium 9.6 8.6 - 10.2 mg/dL   Total Protein 8.1 6.0 - 8.5 g/dL   Albumin 4.9 (H) 3.6 - 4.8 g/dL   Globulin, Total 3.2 1.5 - 4.5 g/dL   Albumin/Globulin Ratio 1.5 1.2 - 2.2   Bilirubin Total 0.8 0.0 - 1.2 mg/dL   Alkaline Phosphatase 97 39 - 117 IU/L   AST 32 0 - 40 IU/L   ALT 37 0 - 44 IU/L  TSH  Result Value Ref Range   TSH 1.880 0.450 - 4.500 uIU/mL  Lipid panel  Result Value Ref Range   Cholesterol, Total 153 100 - 199 mg/dL   Triglycerides 108 0 - 149 mg/dL   HDL 42 >39 mg/dL   VLDL Cholesterol Cal 22 5 - 40 mg/dL   LDL Calculated 89 0 - 99 mg/dL   Chol/HDL Ratio 3.6 0.0 - 5.0 ratio  Microalbumin / creatinine urine ratio  Result Value Ref Range   Creatinine, Urine 306.6 Not Estab. mg/dL   Microalbumin, Urine 329.7 Not Estab. ug/mL   Microalb/Creat Ratio 107.5 (H) 0.0 - 30.0 mg/g creat  Urinalysis, dipstick only  Result Value Ref Range   Specific Gravity, UA 1.025 1.005 - 1.030   pH, UA 5.0 5.0 - 7.5   Color, UA Yellow Yellow   Appearance Ur Clear Clear   Leukocytes, UA Negative Negative   Protein, UA 2+ (A) Negative/Trace   Glucose, UA Negative Negative   Ketones, UA Negative Negative   RBC, UA Trace (A) Negative   Bilirubin, UA Negative Negative   Urobilinogen, Ur 0.2 0.2 - 1.0 mg/dL    Nitrite, UA Negative Negative  PSA  Result Value Ref Range   Prostate Specific Ag, Serum 1.5 0.0 - 4.0 ng/mL  POCT glucose (manual entry)  Result Value Ref Range   POC Glucose 120 (A) 70 - 99 mg/dl  POCT glycosylated hemoglobin (Hb A1C)  Result Value Ref Range   Hemoglobin A1C 9.3        Assessment & Plan:   1. Type 2 diabetes mellitus without complication, without long-term current use of insulin ([place])   2. Essential hypertension   3. Pure hyperglyceridemia   4. Screening for prostate cancer   5. BMI 26.0-26.9,adult      DMII: uncontrolled with hgba1c of 9.3; change Metformin to XR 500mg  two with breakfast.  Encourage compliance with medication; also recommend weight loss, exercise, dietary modification. HTN: controlled; refill provided. Hypercholesterolemia: controlled; obtain labs. Prostate cancer screening: obtain PSA.    Orders Placed This Encounter  Procedures  . Comprehensive metabolic panel  . TSH  . Lipid panel  . Microalbumin / creatinine urine ratio  . Urinalysis, dipstick only  . PSA  . POCT glucose (manual entry)  . POCT glycosylated hemoglobin (Hb A1C)   Meds ordered this encounter  Medications  . metFORMIN (GLUCOPHAGE-XR) 500 MG 24 hr tablet    Sig: Take 2 tablets (1,000 mg total) by mouth daily with breakfast.    Dispense:  360 tablet    Refill:  1  . amLODipine (NORVASC) 10 MG tablet    Sig: Take 1 tablet (10  mg total) by mouth daily.    Dispense:  90 tablet    Refill:  1    Return in about 3 months (around 06/12/2017) for follow-up chronic medical conditions.   Daveena Elmore Elayne Guerin, M.D. Primary Care at [facility] previously [facility] [address] [place], [place]  [address] [phone] phone [phone] fax

## 2017-03-13 LAB — COMPREHENSIVE METABOLIC PANEL
ALBUMIN: 4.9 g/dL — AB (ref 3.6–4.8)
ALK PHOS: 97 IU/L (ref 39–117)
ALT: 37 IU/L (ref 0–44)
AST: 32 IU/L (ref 0–40)
Albumin/Globulin Ratio: 1.5 (ref 1.2–2.2)
BILIRUBIN TOTAL: 0.8 mg/dL (ref 0.0–1.2)
BUN/Creatinine Ratio: 12 (ref 10–24)
BUN: 15 mg/dL (ref 8–27)
CHLORIDE: 99 mmol/L (ref 96–106)
CO2: 25 mmol/L (ref 20–29)
CREATININE: 1.23 mg/dL (ref 0.76–1.27)
Calcium: 9.6 mg/dL (ref 8.6–10.2)
GFR calc Af Amer: 72 mL/min/{1.73_m2} (ref >59)
GFR calc non Af Amer: 62 mL/min/{1.73_m2} (ref >59)
GLOBULIN, TOTAL: 3.2 g/dL (ref 1.5–4.5)
Glucose: 122 mg/dL — ABNORMAL HIGH (ref 65–99)
Potassium: 4.2 mmol/L (ref 3.5–5.2)
SODIUM: 141 mmol/L (ref 134–144)
Total Protein: 8.1 g/dL (ref 6.0–8.5)

## 2017-03-13 LAB — URINALYSIS, DIPSTICK ONLY
Bilirubin, UA: NEGATIVE
GLUCOSE, UA: NEGATIVE
KETONES UA: NEGATIVE
LEUKOCYTES UA: NEGATIVE
NITRITE UA: NEGATIVE
Specific Gravity, UA: 1.025 (ref 1.005–1.030)
Urobilinogen, Ur: 0.2 mg/dL (ref 0.2–1.0)
pH, UA: 5 (ref 5.0–7.5)

## 2017-03-13 LAB — LIPID PANEL
CHOL/HDL RATIO: 3.6 ratio (ref 0.0–5.0)
Cholesterol, Total: 153 mg/dL (ref 100–199)
HDL: 42 mg/dL (ref >39)
LDL Calculated: 89 mg/dL (ref 0–99)
Triglycerides: 108 mg/dL (ref 0–149)
VLDL CHOLESTEROL CAL: 22 mg/dL (ref 5–40)

## 2017-03-13 LAB — MICROALBUMIN / CREATININE URINE RATIO
CREATININE, UR: 306.6 mg/dL
MICROALB/CREAT RATIO: 107.5 mg/g{creat} — AB (ref 0.0–30.0)
MICROALBUM., U, RANDOM: 329.7 ug/mL

## 2017-03-13 LAB — PSA: Prostate Specific Ag, Serum: 1.5 ng/mL (ref 0.0–4.0)

## 2017-03-13 LAB — TSH: TSH: 1.88 u[IU]/mL (ref 0.450–4.500)

## 2017-05-26 DIAGNOSIS — H401131 Primary open-angle glaucoma, bilateral, mild stage: Secondary | ICD-10-CM | POA: Diagnosis not present

## 2017-05-26 DIAGNOSIS — E119 Type 2 diabetes mellitus without complications: Secondary | ICD-10-CM | POA: Diagnosis not present

## 2017-05-26 DIAGNOSIS — H10413 Chronic giant papillary conjunctivitis, bilateral: Secondary | ICD-10-CM | POA: Diagnosis not present

## 2017-06-03 ENCOUNTER — Encounter: Payer: Self-pay | Admitting: Family Medicine

## 2017-06-16 ENCOUNTER — Ambulatory Visit: Payer: Federal, State, Local not specified - PPO | Admitting: Family Medicine

## 2017-07-02 NOTE — Progress Notes (Signed)
Subjective:    Patient ID: Jesse Baldwin, male    DOB: May 08, 1955, 62 y.o.   MRN: [id number]  07/07/2017  Medical Management of Chronic Issues    HPI This 62 y.o. male presents for evaluation of DMII, hypertension, hypercholesterolemia.  Running 129.    146 in the morning.   Taking Metformin XR 1000mg  bid.    Sleep is not good.  Wakes up every 2 hours.  Must urinate.  Chronic for many years.  Unable to fall back asleep.  Gets up four times a night. Wife gave Valium and sleep well.  BP Readings from Last 3 Encounters:  07/07/17 128/76  03/12/17 126/70  11/06/15 124/70   Wt Readings from Last 3 Encounters:  07/07/17 151 lb 6.4 oz (68.7 kg)  03/12/17 146 lb (66.2 kg)  12/11/15 150 lb 8 oz (68.3 kg)   Immunization History  Administered Date(s) Administered  . Influenza-Unspecified 10/28/2013, 10/16/2015, 10/07/2016  . Pneumococcal Polysaccharide-23 11/06/2015  . Td 01/08/2010    Review of Systems  Constitutional: Negative for activity change, appetite change, chills, diaphoresis, fatigue and fever.  Respiratory: Negative for cough and shortness of breath.   Cardiovascular: Negative for chest pain, palpitations and leg swelling.  Gastrointestinal: Negative for abdominal pain, diarrhea, nausea and vomiting.  Endocrine: Negative for cold intolerance, heat intolerance, polydipsia, polyphagia and polyuria.  Skin: Negative for color change, rash and wound.  Neurological: Negative for dizziness, tremors, seizures, syncope, facial asymmetry, speech difficulty, weakness, light-headedness, numbness and headaches.  Psychiatric/Behavioral: Negative for dysphoric mood and sleep disturbance. The patient is not nervous/anxious.     Past Medical History:  Diagnosis Date  . Diabetes mellitus without complication ([place])   . Hyperlipidemia   . Hypertension    Past Surgical History:  Procedure Laterality Date  . CARPAL TUNNEL RELEASE Bilateral   . CATARACT EXTRACTION Right   .  CHOLECYSTECTOMY  1990  . CYST REMOVAL NECK     Allergies  Allergen Reactions  . Septra [Bactrim] Nausea And Vomiting  . Sulfa Antibiotics Rash   Current Outpatient Medications on File Prior to Visit  Medication Sig Dispense Refill  . latanoprost (XALATAN) 0.005 % ophthalmic solution 1 drop at bedtime.    . rosuvastatin (CRESTOR) 20 MG tablet TAKE 1 TABLET BY MOUTH DAILY 90 tablet 3   No current facility-administered medications on file prior to visit.    Social History   Socioeconomic History  . Marital status: Married    Spouse name: Not on file  . Number of children: Not on file  . Years of education: Not on file  . Highest education level: Not on file  Occupational History  . Not on file  Social Needs  . Financial resource strain: Not on file  . Food insecurity:    Worry: Not on file    Inability: Not on file  . Transportation needs:    Medical: Not on file    Non-medical: Not on file  Tobacco Use  . Smoking status: Former Smoker    Last attempt to quit: 04/07/1981    Years since quitting: 36.2  . Smokeless tobacco: Never Used  Substance and Sexual Activity  . Alcohol use: No  . Drug use: No  . Sexual activity: Yes    Birth control/protection: None  Lifestyle  . Physical activity:    Days per week: Not on file    Minutes per session: Not on file  . Stress: Not on file  Relationships  . Social connections:  Talks on phone: Not on file    Gets together: Not on file    Attends religious service: Not on file    Active member of club or organization: Not on file    Attends meetings of clubs or organizations: Not on file    Relationship status: Not on file  . Intimate partner violence:    Fear of current or ex partner: Not on file    Emotionally abused: Not on file    Physically abused: Not on file    Forced sexual activity: Not on file  Other Topics Concern  . 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      Alcohol: quit drinking 1998.      Drugs: none      Exercise: 3 days per week; walking.  Light weights.   Family History  Problem Relation Age of Onset  . Diabetes Mother   . Hypertension Mother   . Diabetes Father   . Hypertension Father   . Diabetes Sister   . Hypertension Sister   . Cancer Sister 50       bone marrow cancer  . Diabetes Brother   . Hypertension Brother   . Colon cancer Neg Hx        Objective:    BP 128/76   Pulse 74   Temp 98.9 F (37.2 C) (Oral)   Resp 18   Ht 5' 2.21" (1.58 m)   Wt 151 lb 6.4 oz (68.7 kg)   SpO2 99%   BMI 27.50 kg/m  Physical Exam  Constitutional: He is oriented to person, place, and time. He appears well-developed and well-nourished. No distress.  HENT:  Head: Normocephalic and atraumatic.  Right Ear: External ear normal.  Left Ear: External ear normal.  Nose: Nose normal.  Mouth/Throat: Oropharynx is clear and moist.  Eyes: Pupils are equal, round, and reactive to light. Conjunctivae and EOM are normal.  Neck: Normal range of motion. Neck supple. Carotid bruit is not present. No thyromegaly present.  Cardiovascular: Normal rate, regular rhythm, normal heart sounds and intact distal pulses. Exam reveals no gallop and no friction rub.  No murmur heard. Pulmonary/Chest: Effort normal and breath sounds normal. He has no wheezes. He has no rales.  Abdominal: Soft. Bowel sounds are normal. He exhibits no distension and no mass. There is no tenderness. There is no rebound and no guarding.  Lymphadenopathy:    He has no cervical adenopathy.  Neurological: He is alert and oriented to person, place, and time. He displays normal reflexes. No cranial nerve deficit or sensory deficit. He exhibits normal muscle tone. Coordination normal.    Skin: Skin is warm and dry. No rash noted. He is not diaphoretic.  Psychiatric: He has a normal mood and affect. His behavior is normal.  Nursing note and vitals reviewed.  No results found. Depression screen [facility] 2/9 03/12/2017 11/06/2015 01/09/2015 11/07/2014  Decreased Interest 0 0 0 0  Down, Depressed, Hopeless 0 0 0 0  PHQ - 2 Score 0 0 0 0   Fall Risk  03/12/2017 11/06/2015  Falls in the past year? No No  Assessment & Plan:   1. Type 2 diabetes mellitus with microalbuminuria, without long-term current use of insulin ([place])   2. Essential hypertension   3. Pure hyperglyceridemia   4. Insomnia due to medical condition     DMII: uncontrolled yet improved glycemic control since last visit with current hemoglobin A1c of 8.0 down from 9.3.  Will need to add an additional agent.  Also warrants ACE inhibitor due to positive urine microalbumin at last visit.  Initiate lisinopril 10 mg 1 tablet daily.  Add Januvia 100 mg 1 tablet daily.  Hypertension: Controlled.  Add lisinopril 10 mg daily.  Continue amlodipine therapy.  Obtain labs.  Hypercholesterolemia: Controlled.  Obtain labs for chronic disease management.  Refills provided.  Insomnia: New.  No evidence of underlying depression or anxiety.  Sleep hygiene reviewed.  Avoid caffeine after 3 PM.  Initiate trazodone 50 mg 1 at bedtime.  Orders Placed This Encounter  Procedures  . Comprehensive metabolic panel    Order Specific Question:   Has the patient fasted?    Answer:   No  . Lipid panel    Order Specific Question:   Has the patient fasted?    Answer:   No  . CBC with Differential/Platelet  . POCT urinalysis dipstick  . POCT glucose (manual entry)  . POCT glycosylated hemoglobin (Hb A1C)   Meds ordered this encounter  Medications  . lisinopril (PRINIVIL,ZESTRIL) 10 MG tablet    Sig: Take 1 tablet (10 mg total) by mouth daily.    Dispense:  90 tablet    Refill:  1  . traZODone (DESYREL) 50 MG tablet    Sig: Take  1 tablet (50 mg total) by mouth at bedtime as needed for sleep.    Dispense:  90 tablet    Refill:  1  . metFORMIN (GLUCOPHAGE-XR) 500 MG 24 hr tablet    Sig: Take 4 tablets (2,000 mg total) by mouth daily with breakfast.    Dispense:  360 tablet    Refill:  1  . amLODipine (NORVASC) 10 MG tablet    Sig: Take 1 tablet (10 mg total) by mouth daily.    Dispense:  90 tablet    Refill:  1  . sitaGLIPtin (JANUVIA) 100 MG tablet    Sig: Take 1 tablet (100 mg total) by mouth daily.    Dispense:  90 tablet    Refill:  3    Return in about 3 months (around 10/07/2017) for follow-up chronic medical conditions [place].   Kristi Elayne Guerin, M.D. Primary Care at [facility] previously [facility] [place] [facility] [address] [place], [place]  [address] [phone] phone [phone] fax

## 2017-07-07 ENCOUNTER — Other Ambulatory Visit: Payer: Self-pay

## 2017-07-07 ENCOUNTER — Encounter: Payer: Self-pay | Admitting: Family Medicine

## 2017-07-07 ENCOUNTER — Ambulatory Visit: Payer: Federal, State, Local not specified - PPO | Admitting: Family Medicine

## 2017-07-07 VITALS — BP 128/76 | HR 74 | Temp 98.9°F | Resp 18 | Ht 62.21 in | Wt 151.4 lb

## 2017-07-07 DIAGNOSIS — E1129 Type 2 diabetes mellitus with other diabetic kidney complication: Secondary | ICD-10-CM | POA: Diagnosis not present

## 2017-07-07 DIAGNOSIS — I10 Essential (primary) hypertension: Secondary | ICD-10-CM | POA: Diagnosis not present

## 2017-07-07 DIAGNOSIS — G4701 Insomnia due to medical condition: Secondary | ICD-10-CM

## 2017-07-07 DIAGNOSIS — R809 Proteinuria, unspecified: Secondary | ICD-10-CM

## 2017-07-07 DIAGNOSIS — E781 Pure hyperglyceridemia: Secondary | ICD-10-CM

## 2017-07-07 DIAGNOSIS — E119 Type 2 diabetes mellitus without complications: Secondary | ICD-10-CM | POA: Diagnosis not present

## 2017-07-07 LAB — POCT URINALYSIS DIP (MANUAL ENTRY)
BILIRUBIN UA: NEGATIVE
BILIRUBIN UA: NEGATIVE mg/dL
GLUCOSE UA: NEGATIVE mg/dL
LEUKOCYTES UA: NEGATIVE
Nitrite, UA: NEGATIVE
Spec Grav, UA: 1.02 (ref 1.010–1.025)
Urobilinogen, UA: 0.2 E.U./dL
pH, UA: 5.5 (ref 5.0–8.0)

## 2017-07-07 LAB — POCT GLYCOSYLATED HEMOGLOBIN (HGB A1C): Hemoglobin A1C: 8 % — AB (ref 4.0–5.6)

## 2017-07-07 LAB — GLUCOSE, POCT (MANUAL RESULT ENTRY): POC Glucose: 104 mg/dl — AB (ref 70–99)

## 2017-07-07 MED ORDER — TRAZODONE HCL 50 MG PO TABS: 50.0000 mg | ORAL_TABLET | Freq: Every evening | ORAL | 1 refills | Status: DC | PRN

## 2017-07-07 MED ORDER — AMLODIPINE BESYLATE 10 MG PO TABS: 10.0000 mg | ORAL_TABLET | Freq: Every day | ORAL | 1 refills | Status: DC

## 2017-07-07 MED ORDER — LISINOPRIL 10 MG PO TABS: 10.0000 mg | ORAL_TABLET | Freq: Every day | ORAL | 1 refills | Status: DC

## 2017-07-07 MED ORDER — METFORMIN HCL ER 500 MG PO TB24: 2000.0000 mg | ORAL_TABLET | Freq: Every day | ORAL | 1 refills | Status: DC

## 2017-07-07 NOTE — Patient Instructions (Addendum)
  Trazodone at bedtime for sleep. Lisinopril for kidney protection. Continue all other medications.   IF you received an x-ray today, you will receive an invoice from [facility]. Please contact [facility] at [phone] with questions or concerns regarding your invoice.   IF you received labwork today, you will receive an invoice from [facility]. Please contact [facility] at [phone] with questions or concerns regarding your invoice.   Our billing staff will not be able to assist you with questions regarding bills from these companies.  You will be contacted with the lab results as soon as they are available. The fastest way to get your results is to activate your My Chart account. Instructions are located on the last page of this paperwork. If you have not heard from [place] regarding the results in 2 weeks, please contact this office.

## 2017-07-08 LAB — CBC WITH DIFFERENTIAL/PLATELET
BASOS: 1 %
Basophils Absolute: 0.1 10*3/uL (ref 0.0–0.2)
EOS (ABSOLUTE): 0.1 10*3/uL (ref 0.0–0.4)
Eos: 1 %
HEMOGLOBIN: 15.2 g/dL (ref 13.0–17.7)
Hematocrit: 45.6 % (ref 37.5–51.0)
IMMATURE GRANULOCYTES: 0 %
Immature Grans (Abs): 0 10*3/uL (ref 0.0–0.1)
Lymphocytes Absolute: 3.1 10*3/uL (ref 0.7–3.1)
Lymphs: 32 %
MCH: 28.6 pg (ref 26.6–33.0)
MCHC: 33.3 g/dL (ref 31.5–35.7)
MCV: 86 fL (ref 79–97)
Monocytes Absolute: 0.7 10*3/uL (ref 0.1–0.9)
Monocytes: 7 %
NEUTROS PCT: 59 %
Neutrophils Absolute: 5.8 10*3/uL (ref 1.4–7.0)
Platelets: 202 10*3/uL (ref 150–450)
RBC: 5.32 x10E6/uL (ref 4.14–5.80)
RDW: 14.7 % (ref 12.3–15.4)
WBC: 9.7 10*3/uL (ref 3.4–10.8)

## 2017-07-08 LAB — LIPID PANEL
CHOL/HDL RATIO: 3.5 ratio (ref 0.0–5.0)
CHOLESTEROL TOTAL: 138 mg/dL (ref 100–199)
HDL: 39 mg/dL — AB (ref >39)
LDL Calculated: 70 mg/dL (ref 0–99)
TRIGLYCERIDES: 147 mg/dL (ref 0–149)
VLDL Cholesterol Cal: 29 mg/dL (ref 5–40)

## 2017-07-08 LAB — COMPREHENSIVE METABOLIC PANEL
A/G RATIO: 1.4 (ref 1.2–2.2)
ALK PHOS: 89 IU/L (ref 39–117)
ALT: 37 IU/L (ref 0–44)
AST: 31 IU/L (ref 0–40)
Albumin: 4.5 g/dL (ref 3.6–4.8)
BILIRUBIN TOTAL: 0.5 mg/dL (ref 0.0–1.2)
BUN/Creatinine Ratio: 17 (ref 10–24)
BUN: 17 mg/dL (ref 8–27)
CHLORIDE: 102 mmol/L (ref 96–106)
CO2: 26 mmol/L (ref 20–29)
Calcium: 9.4 mg/dL (ref 8.6–10.2)
Creatinine, Ser: 0.98 mg/dL (ref 0.76–1.27)
GFR calc Af Amer: 94 mL/min/{1.73_m2} (ref >59)
GFR calc non Af Amer: 82 mL/min/{1.73_m2} (ref >59)
GLOBULIN, TOTAL: 3.3 g/dL (ref 1.5–4.5)
Glucose: 118 mg/dL — ABNORMAL HIGH (ref 65–99)
POTASSIUM: 4 mmol/L (ref 3.5–5.2)
SODIUM: 142 mmol/L (ref 134–144)
Total Protein: 7.8 g/dL (ref 6.0–8.5)

## 2017-07-12 MED ORDER — SITAGLIPTIN PHOSPHATE 100 MG PO TABS: 100.0000 mg | ORAL_TABLET | Freq: Every day | ORAL | 3 refills | Status: DC

## 2017-08-08 ENCOUNTER — Telehealth: Payer: Self-pay | Admitting: Family Medicine

## 2017-08-09 ENCOUNTER — Ambulatory Visit: Payer: Federal, State, Local not specified - PPO | Admitting: Family Medicine

## 2017-08-09 ENCOUNTER — Encounter: Payer: Self-pay | Admitting: Family Medicine

## 2017-08-13 ENCOUNTER — Other Ambulatory Visit: Payer: Self-pay

## 2017-08-13 ENCOUNTER — Ambulatory Visit: Payer: Federal, State, Local not specified - PPO | Admitting: Family Medicine

## 2017-08-13 ENCOUNTER — Encounter: Payer: Self-pay | Admitting: Family Medicine

## 2017-08-13 VITALS — BP 131/75 | HR 75 | Temp 99.0°F | Ht 62.91 in | Wt 150.0 lb

## 2017-08-13 DIAGNOSIS — I10 Essential (primary) hypertension: Secondary | ICD-10-CM

## 2017-08-13 DIAGNOSIS — E1129 Type 2 diabetes mellitus with other diabetic kidney complication: Secondary | ICD-10-CM

## 2017-08-13 DIAGNOSIS — E785 Hyperlipidemia, unspecified: Secondary | ICD-10-CM

## 2017-08-13 DIAGNOSIS — N401 Enlarged prostate with lower urinary tract symptoms: Secondary | ICD-10-CM

## 2017-08-13 DIAGNOSIS — R351 Nocturia: Secondary | ICD-10-CM

## 2017-08-13 DIAGNOSIS — R809 Proteinuria, unspecified: Secondary | ICD-10-CM

## 2017-08-13 MED ORDER — SAXAGLIPTIN HCL 5 MG PO TABS: 5.0000 mg | ORAL_TABLET | Freq: Every day | ORAL | 3 refills | Status: DC

## 2017-08-13 MED ORDER — LOSARTAN POTASSIUM 25 MG PO TABS: 25.0000 mg | ORAL_TABLET | Freq: Every day | ORAL | 1 refills | Status: DC

## 2017-08-13 MED ORDER — ALFUZOSIN HCL ER 10 MG PO TB24: 10.0000 mg | ORAL_TABLET | Freq: Every day | ORAL | 3 refills | Status: DC

## 2017-08-13 NOTE — Patient Instructions (Signed)
     IF you received an x-ray today, you will receive an invoice from [facility]. Please contact [facility] at [phone] with questions or concerns regarding your invoice.   IF you received labwork today, you will receive an invoice from [facility]. Please contact [facility] at [phone] with questions or concerns regarding your invoice.   Our billing staff will not be able to assist you with questions regarding bills from these companies.  You will be contacted with the lab results as soon as they are available. The fastest way to get your results is to activate your My Chart account. Instructions are located on the last page of this paperwork. If you have not heard from us regarding the results in 2 weeks, please contact this office.     

## 2017-08-13 NOTE — Progress Notes (Signed)
8/7/20198:45 AM  Al Corpus [birth year], 62 y.o. male [id number]  Chief Complaint  Patient presents with  . Establish Care    no longer taking Januvia due to the side effects family members experienced Lisinipril.Causes causes coughing and sneezing    HPI:   Patient is a 62 y.o. male with past medical history significant for DM2, HTN, HLP, glaucoma who presents today to establish care  Previous PCP Dr Tamala Julian, last appt July 2019 Eye - Dr Calvert Cantor  Started on [place] at that visit due to still elevated A1c Also started on ACE due to + urine micro Strong fhx DM2, many family members have had bad reactions so he never even tried it. They have done better with glimperide, he would like to try that instead Fasting are doing well 114 - 140s,, in the afternoon 160s Checks fasting daily, afternoon sporadically Has changed diet significantly, stopped sweets Decrease from 9.3 to 8. 0 just with adding metformin and diet Lisinopril caused cough and sneezing, would like something different Having issues with nocturia 3-4 x at night, interfering with sleep and rest, trazodone and melatonin did not help, normal PSA in march 2019  Lab Results  Component Value Date   HGBA1C 8.0 (A) 07/07/2017  improved, previous was 9.3  LDL 70 Normal creatinine and LFTs  Fall Risk  08/13/2017 03/12/2017 11/06/2015  Falls in the past year? No No No     Depression screen [facility] 2/9 08/13/2017 03/12/2017 11/06/2015  Decreased Interest 0 0 0  Down, Depressed, Hopeless 0 0 0  PHQ - 2 Score 0 0 0    Allergies  Allergen Reactions  . Septra [Bactrim] Nausea And Vomiting  . Sulfa Antibiotics Rash    Prior to Admission medications   Medication Sig Start Date End Date Taking? Authorizing Provider  amLODipine (NORVASC) 10 MG tablet Take 1 tablet (10 mg total) by mouth daily. 07/07/17  Yes Wardell Honour, MD  latanoprost (XALATAN) 0.005 % ophthalmic solution 1 drop at bedtime.   Yes [provider]    lisinopril (PRINIVIL,ZESTRIL) 10 MG tablet Take 1 tablet (10 mg total) by mouth daily. 07/07/17  Yes Wardell Honour, MD  metFORMIN (GLUCOPHAGE-XR) 500 MG 24 hr tablet Take 4 tablets (2,000 mg total) by mouth daily with breakfast. 07/07/17  Yes Wardell Honour, MD  rosuvastatin (CRESTOR) 20 MG tablet TAKE 1 TABLET BY MOUTH DAILY 01/20/17  Yes Wardell Honour, MD    Past Medical History:  Diagnosis Date  . Diabetes mellitus without complication ([place])   . Hyperlipidemia   . Hypertension     Past Surgical History:  Procedure Laterality Date  . CARPAL TUNNEL RELEASE Bilateral   . CATARACT EXTRACTION Right   . CHOLECYSTECTOMY  1990  . CYST REMOVAL NECK      Social History   Tobacco Use  . Smoking status: Former Smoker    Last attempt to quit: 04/07/1981    Years since quitting: 36.3  . Smokeless tobacco: Never Used  Substance Use Topics  . Alcohol use: No    Family History  Problem Relation Age of Onset  . Diabetes Mother   . Hypertension Mother   . Diabetes Father   . Hypertension Father   . Diabetes Sister   . Hypertension Sister   . Cancer Sister 50       bone marrow cancer  . Diabetes Brother   . Hypertension Brother   . Colon cancer Neg Hx     Review  of Systems  Constitutional: Negative for chills and fever.  Respiratory: Negative for cough and shortness of breath.   Cardiovascular: Negative for chest pain, palpitations and leg swelling.  Gastrointestinal: Negative for abdominal pain, nausea and vomiting.     OBJECTIVE:  Blood pressure 131/75, pulse 75, temperature 99 F (37.2 C), temperature source Oral, height 5' 2.91" (1.598 m), weight 150 lb (68 kg), SpO2 97 %. Body mass index is 26.64 kg/m.   Physical Exam  Constitutional: He is oriented to person, place, and time. He appears well-developed and well-nourished.  HENT:  Head: Normocephalic and atraumatic.  Mouth/Throat: Oropharynx is clear and moist.  Eyes: Pupils are equal, round, and reactive to  light. EOM are normal.  Neck: Neck supple.  Cardiovascular: Normal rate and regular rhythm. Exam reveals no gallop and no friction rub.  No murmur heard. Pulmonary/Chest: Effort normal and breath sounds normal. He has no wheezes. He has no rales.  Neurological: He is alert and oriented to person, place, and time.  Skin: Skin is warm and dry.  Psychiatric: He has a normal mood and affect.  Nursing note and vitals reviewed.   ASSESSMENT and PLAN  1. Type 2 diabetes mellitus with microalbuminuria, without long-term current use of insulin (HCC) Uncontrolled. Changing januvia to onglyza, as patient has irregular meal times, less risk of hypoglycemia. Adding ARB for microalbuminuria,  2. Essential hypertension Controlled. Continue current regime. Adding ARB for microalbuminuria, discussed monitoring for hypotension, might need to decrease amlodipine   3. Hyperlipidemia, unspecified hyperlipidemia type Controlled. Continue current regime.   4. Benign prostatic hyperplasia with nocturia New sx to me, adding alfuzosin. Unable to do tamsulosin due to sulfa allergy. Discussed med r/se/b incl orthostatic hypotension.  Other orders - losartan (COZAAR) 25 MG tablet; Take 1 tablet (25 mg total) by mouth daily. - saxagliptin HCl (ONGLYZA) 5 MG TABS tablet; Take 1 tablet (5 mg total) by mouth daily. - alfuzosin (UROXATRAL) 10 MG 24 hr tablet; Take 1 tablet (10 mg total) by mouth daily with breakfast.  Return in about 3 months (around 11/13/2017).    Rutherford Guys, MD Primary Care at [place],  [address] Ph.  [phone] Fax [phone]

## 2017-08-19 ENCOUNTER — Telehealth: Payer: Self-pay | Admitting: Family Medicine

## 2017-08-19 DIAGNOSIS — R809 Proteinuria, unspecified: Secondary | ICD-10-CM

## 2017-08-19 DIAGNOSIS — E1129 Type 2 diabetes mellitus with other diabetic kidney complication: Secondary | ICD-10-CM

## 2017-08-19 NOTE — Telephone Encounter (Signed)
Called pt. To reschedule appt on [id number]. Left VM

## 2017-08-26 NOTE — Telephone Encounter (Signed)
Please advise 

## 2017-08-26 NOTE — Telephone Encounter (Signed)
Copied from [place] [phone]. Topic: Inquiry >> Aug 25, 2017  4:13 PM Oliver Pila B wrote: Reason for CRM: pt has an appt in Nov 7th and would like a lab order created to check blood; contact pt to clarify what type of panel needs to be created

## 2017-08-28 NOTE — Telephone Encounter (Signed)
Unable to reach patient no voice mail

## 2017-08-28 NOTE — Telephone Encounter (Signed)
Please let patient know that labs ordered for 1 week prior to next appt. Fasting preferred. thanks

## 2017-09-15 NOTE — Telephone Encounter (Signed)
Informed pt labs has been placed in the system.

## 2017-10-07 ENCOUNTER — Telehealth: Payer: Self-pay | Admitting: Family Medicine

## 2017-10-07 NOTE — Telephone Encounter (Signed)
Called pt. To reschedule visit with Dr. Pamella Pert on 11/13/17. Left VM to call back. If pt. Calls back please reschedule.

## 2017-10-10 ENCOUNTER — Ambulatory Visit: Payer: Federal, State, Local not specified - PPO | Admitting: Family Medicine

## 2017-10-27 DIAGNOSIS — H10413 Chronic giant papillary conjunctivitis, bilateral: Secondary | ICD-10-CM | POA: Diagnosis not present

## 2017-10-27 DIAGNOSIS — E119 Type 2 diabetes mellitus without complications: Secondary | ICD-10-CM | POA: Diagnosis not present

## 2017-10-27 DIAGNOSIS — H401131 Primary open-angle glaucoma, bilateral, mild stage: Secondary | ICD-10-CM | POA: Diagnosis not present

## 2017-10-27 LAB — HM DIABETES EYE EXAM

## 2017-10-30 NOTE — Telephone Encounter (Signed)
DONE

## 2017-11-04 ENCOUNTER — Other Ambulatory Visit: Payer: Self-pay | Admitting: Family Medicine

## 2017-11-04 NOTE — Telephone Encounter (Signed)
Requested Prescriptions  Pending Prescriptions Disp Refills  . alfuzosin (UROXATRAL) 10 MG 24 hr tablet [Pharmacy Med Name: ALFUZOSIN HCL ER 10 MG TABLET] 90 tablet 0    Sig: TAKE 1 TABLET (10 MG TOTAL) BY MOUTH DAILY WITH BREAKFAST.     Urology: Alpha-Adrenergic Blocker Passed - 11/04/2017  1:40 AM      Passed - Last BP in normal range    BP Readings from Last 1 Encounters:  08/13/17 131/75         Passed - Valid encounter within last 12 months    Recent Outpatient Visits          2 months ago Type 2 diabetes mellitus with microalbuminuria, without long-term current use of insulin [facility])   [facility] at [facility], Lilia Argue, MD   4 months ago Type 2 diabetes mellitus with microalbuminuria, without long-term current use of insulin [facility])   Primary Care at [facility], Renette Butters, MD   7 months ago Type 2 diabetes mellitus without complication, without long-term current use of insulin [facility])   Primary Care at [facility], Renette Butters, MD   1 year ago Type 2 diabetes mellitus without complication, without long-term current use of insulin [facility])   Primary Care at [facility], Renette Butters, MD   2 years ago Annual physical exam   Primary Care at [facility], Linton Ham, MD      Future Appointments            In 1 week Rutherford Guys, MD Primary Care at [place], [facility]         . ONGLYZA 5 MG TABS tablet [Pharmacy Med Name: ONGLYZA 5 MG TABLET] 90 tablet 0    Sig: TAKE 1 TABLET BY MOUTH EVERY DAY     Endocrinology:  Diabetes - DPP-4 Inhibitors Failed - 11/04/2017  1:40 AM      Failed - HBA1C is between 0 and 7.9 and within 180 days    Hemoglobin A1C  Date Value Ref Range Status  07/07/2017 8.0 (A) 4.0 - 5.6 % Final   Hgb A1c MFr Bld  Date Value Ref Range Status  11/07/2014 7.2 (A) 4.0 - 6.0 % Final         Passed - Cr in normal range and within 360 days    Creat  Date Value Ref Range Status  11/06/2015 0.97 0.70 - 1.25 mg/dL Final    Comment:      For patients > or = 64  years of age: The upper reference limit for Creatinine is approximately 13% higher for people identified as African-American.      Creatinine, Ser  Date Value Ref Range Status  07/07/2017 0.98 0.76 - 1.27 mg/dL Final         Passed - Valid encounter within last 6 months    Recent Outpatient Visits          2 months ago Type 2 diabetes mellitus with microalbuminuria, without long-term current use of insulin [facility])   [facility] at [facility], Lilia Argue, MD   4 months ago Type 2 diabetes mellitus with microalbuminuria, without long-term current use of insulin [facility])   Primary Care at [facility], Renette Butters, MD   7 months ago Type 2 diabetes mellitus without complication, without long-term current use of insulin [facility])   Primary Care at [facility], Renette Butters, MD   1 year ago Type 2 diabetes mellitus without complication, without long-term current use of insulin ([place])  Primary Care at [facility], Renette Butters, MD   2 years ago Annual physical exam   Primary Care at [facility], Linton Ham, MD      Future Appointments            In 1 week Rutherford Guys, MD Primary Care at [place], [facility]

## 2017-11-13 ENCOUNTER — Ambulatory Visit: Payer: Federal, State, Local not specified - PPO | Admitting: Family Medicine

## 2017-11-14 ENCOUNTER — Ambulatory Visit: Payer: Federal, State, Local not specified - PPO | Admitting: Family Medicine

## 2017-11-28 ENCOUNTER — Ambulatory Visit (HOSPITAL_COMMUNITY)
Admission: EM | Admit: 2017-11-28 | Discharge: 2017-11-28 | Disposition: A | Discharge status: Home / Self Care | Payer: Federal, State, Local not specified - PPO | Attending: Family Medicine | Admitting: Family Medicine

## 2017-11-28 ENCOUNTER — Encounter (HOSPITAL_COMMUNITY): Payer: Self-pay | Admitting: Family Medicine

## 2017-11-28 DIAGNOSIS — J209 Acute bronchitis, unspecified: Secondary | ICD-10-CM | POA: Diagnosis not present

## 2017-11-28 MED ORDER — AMOXICILLIN-POT CLAVULANATE 875-125 MG PO TABS: 1.0000 | ORAL_TABLET | Freq: Two times a day (BID) | ORAL | 0 refills | Status: DC

## 2017-11-28 MED ORDER — HYDROCODONE-HOMATROPINE 5-1.5 MG/5ML PO SYRP: 5.0000 mL | ORAL_SOLUTION | Freq: Four times a day (QID) | ORAL | 0 refills | Status: DC | PRN

## 2017-11-28 NOTE — ED Provider Notes (Signed)
[place]    CSN: [id number] Arrival date & time: 11/28/17  1806     History   Chief Complaint No chief complaint on file.   HPI Jesse Baldwin is a 62 y.o. male.   62 year old man presents for his initial visit at the urgent care facility at [facility] with a cough and fever.  He has been getting gradually worse for the last week.  He also has a sore throat.  Patient denies shortness of breath but he has had some tightness in his chest.  Patient normally goes to [place] urgent care but they were unable to work him in this week.     Past Medical History:  Diagnosis Date  . Diabetes mellitus without complication ([place])   . Hyperlipidemia   . Hypertension     Patient Active Problem List   Diagnosis Date Noted  . DM (diabetes mellitus) type II controlled with renal manifestation ([place]) 01/09/2015  . Hypertension 04/08/2011  . Hyperlipidemia 04/08/2011  . Glaucoma 04/08/2011  . Hearing loss in right ear 04/08/2011    Past Surgical History:  Procedure Laterality Date  . CARPAL TUNNEL RELEASE Bilateral   . CATARACT EXTRACTION Right   . CHOLECYSTECTOMY  1990  . CYST REMOVAL NECK         Home Medications    Prior to Admission medications   Medication Sig Start Date End Date Taking? Authorizing Provider  alfuzosin (UROXATRAL) 10 MG 24 hr tablet TAKE 1 TABLET (10 MG TOTAL) BY MOUTH DAILY WITH BREAKFAST. 11/04/17   Rutherford Guys, MD  amLODipine (NORVASC) 10 MG tablet Take 1 tablet (10 mg total) by mouth daily. 07/07/17   Wardell Honour, MD  amoxicillin-clavulanate (AUGMENTIN) 875-125 MG tablet Take 1 tablet by mouth every 12 (twelve) hours. 11/28/17   Robyn Haber, MD  HYDROcodone-homatropine (HYDROMET) 5-1.5 MG/5ML syrup Take 5 mLs by mouth every 6 (six) hours as needed for cough. 11/28/17   Robyn Haber, MD  latanoprost (XALATAN) 0.005 % ophthalmic solution 1 drop at bedtime.    [provider]  losartan (COZAAR) 25 MG tablet Take  1 tablet (25 mg total) by mouth daily. 08/13/17   Rutherford Guys, MD  metFORMIN (GLUCOPHAGE-XR) 500 MG 24 hr tablet Take 4 tablets (2,000 mg total) by mouth daily with breakfast. 07/07/17   Wardell Honour, MD  ONGLYZA 5 MG TABS tablet TAKE 1 TABLET BY MOUTH EVERY DAY 11/04/17   Rutherford Guys, MD  rosuvastatin (CRESTOR) 20 MG tablet TAKE 1 TABLET BY MOUTH DAILY 01/20/17   Wardell Honour, MD    Family History Family History  Problem Relation Age of Onset  . Diabetes Mother   . Hypertension Mother   . Diabetes Father   . Hypertension Father   . Diabetes Sister   . Hypertension Sister   . Cancer Sister 50       bone marrow cancer  . Diabetes Brother   . Hypertension Brother   . Colon cancer Neg Hx     Social History Social History   Tobacco Use  . Smoking status: Former Smoker    Last attempt to quit: 04/07/1981    Years since quitting: 36.6  . Smokeless tobacco: Never Used  Substance Use Topics  . Alcohol use: No  . Drug use: No     Allergies   Septra [bactrim] and Sulfa antibiotics   Review of Systems Review of Systems   Physical Exam Triage Vital Signs ED Triage Vitals  Enc Vitals Group     BP      Pulse      Resp      Temp      Temp src      SpO2      Weight      Height      Head Circumference      Peak Flow      Pain Score      Pain Loc      Pain Edu?      Excl. in [place]?    No data found.  Updated Vital Signs There were no vitals taken for this visit.   Physical Exam   UC Treatments / Results  Labs (all labs ordered are listed, but only abnormal results are displayed) Labs Reviewed - No data to display  EKG None  Radiology No results found.  Procedures Procedures (including critical care time)  Medications Ordered in UC Medications - No data to display  Initial Impression / Assessment and Plan / UC Course  I have reviewed the triage vital signs and the nursing notes.  Pertinent labs & imaging results that were available during  my care of the patient were reviewed by me and considered in my medical decision making (see chart for details).    Final Clinical Impressions(s) / UC Diagnoses   Final diagnoses:  Acute bronchitis, unspecified organism   Discharge Instructions   None    ED Prescriptions    Medication Sig Dispense Auth. Provider   amoxicillin-clavulanate (AUGMENTIN) 875-125 MG tablet Take 1 tablet by mouth every 12 (twelve) hours. 14 tablet Robyn Haber, MD   HYDROcodone-homatropine (HYDROMET) 5-1.5 MG/5ML syrup Take 5 mLs by mouth every 6 (six) hours as needed for cough. 60 mL Robyn Haber, MD     Controlled Substance Prescriptions [place] Controlled Substance Registry consulted? Not Applicable   Robyn Haber, MD 11/28/17 [phone]

## 2017-12-06 ENCOUNTER — Ambulatory Visit (INDEPENDENT_AMBULATORY_CARE_PROVIDER_SITE_OTHER): Payer: Federal, State, Local not specified - PPO | Admitting: Family Medicine

## 2017-12-06 DIAGNOSIS — E1129 Type 2 diabetes mellitus with other diabetic kidney complication: Secondary | ICD-10-CM

## 2017-12-06 DIAGNOSIS — R809 Proteinuria, unspecified: Secondary | ICD-10-CM | POA: Diagnosis not present

## 2017-12-06 NOTE — Progress Notes (Signed)
Nurse visit for lab draw only.

## 2017-12-07 LAB — COMPREHENSIVE METABOLIC PANEL
ALT: 36 IU/L (ref 0–44)
AST: 28 IU/L (ref 0–40)
Albumin/Globulin Ratio: 1.2 (ref 1.2–2.2)
Albumin: 4.3 g/dL (ref 3.6–4.8)
Alkaline Phosphatase: 95 IU/L (ref 39–117)
BUN/Creatinine Ratio: 15 (ref 10–24)
BUN: 19 mg/dL (ref 8–27)
Bilirubin Total: 0.5 mg/dL (ref 0.0–1.2)
CO2: 21 mmol/L (ref 20–29)
Calcium: 9.8 mg/dL (ref 8.6–10.2)
Chloride: 101 mmol/L (ref 96–106)
Creatinine, Ser: 1.27 mg/dL (ref 0.76–1.27)
GFR calc Af Amer: 69 mL/min/{1.73_m2} (ref >59)
GFR calc non Af Amer: 59 mL/min/{1.73_m2} — ABNORMAL LOW (ref >59)
Globulin, Total: 3.5 g/dL (ref 1.5–4.5)
Glucose: 150 mg/dL — ABNORMAL HIGH (ref 65–99)
Potassium: 4.4 mmol/L (ref 3.5–5.2)
Sodium: 141 mmol/L (ref 134–144)
Total Protein: 7.8 g/dL (ref 6.0–8.5)

## 2017-12-07 LAB — HEMOGLOBIN A1C
Est. average glucose Bld gHb Est-mCnc: 180 mg/dL
Hgb A1c MFr Bld: 7.9 % — ABNORMAL HIGH (ref 4.8–5.6)

## 2017-12-10 ENCOUNTER — Other Ambulatory Visit: Payer: Self-pay | Admitting: Family Medicine

## 2017-12-10 NOTE — Telephone Encounter (Signed)
Requested Prescriptions  Pending Prescriptions Disp Refills  . losartan (COZAAR) 25 MG tablet [Pharmacy Med Name: LOSARTAN POTASSIUM 25 MG TAB] 90 tablet 1    Sig: TAKE 1 TABLET BY MOUTH EVERY DAY     Cardiovascular:  Angiotensin Receptor Blockers Passed - 12/10/2017  2:49 AM      Passed - Cr in normal range and within 180 days    Creat  Date Value Ref Range Status  11/06/2015 0.97 0.70 - 1.25 mg/dL Final    Comment:      For patients > or = 64 years of age: The upper reference limit for Creatinine is approximately 13% higher for people identified as African-American.      Creatinine, Ser  Date Value Ref Range Status  12/06/2017 1.27 0.76 - 1.27 mg/dL Final         Passed - K in normal range and within 180 days    Potassium  Date Value Ref Range Status  12/06/2017 4.4 3.5 - 5.2 mmol/L Final         Passed - Patient is not pregnant      Passed - Last BP in normal range    BP Readings from Last 1 Encounters:  08/13/17 131/75         Passed - Valid encounter within last 6 months    Recent Outpatient Visits          4 days ago Type 2 diabetes mellitus with microalbuminuria, without long-term current use of insulin ([place])   Primary Care at [facility], Lilia Argue, MD   3 months ago Type 2 diabetes mellitus with microalbuminuria, without long-term current use of insulin [facility])   [facility] at [facility], Lilia Argue, MD   5 months ago Type 2 diabetes mellitus with microalbuminuria, without long-term current use of insulin [facility])   Primary Care at [facility], Renette Butters, MD   9 months ago Type 2 diabetes mellitus without complication, without long-term current use of insulin [facility])   Primary Care at [facility], Renette Butters, MD   2 years ago Type 2 diabetes mellitus without complication, without long-term current use of insulin [facility])   Primary Care at [facility], Renette Butters, MD      Future Appointments            In 2 days Rutherford Guys, MD Primary Care at [place], [facility]

## 2017-12-12 ENCOUNTER — Other Ambulatory Visit: Payer: Self-pay

## 2017-12-12 ENCOUNTER — Encounter: Payer: Self-pay | Admitting: Family Medicine

## 2017-12-12 ENCOUNTER — Ambulatory Visit: Payer: Federal, State, Local not specified - PPO | Admitting: Family Medicine

## 2017-12-12 VITALS — BP 120/74 | HR 72 | Temp 98.3°F | Ht 62.91 in | Wt 149.8 lb

## 2017-12-12 DIAGNOSIS — I10 Essential (primary) hypertension: Secondary | ICD-10-CM

## 2017-12-12 DIAGNOSIS — R809 Proteinuria, unspecified: Secondary | ICD-10-CM

## 2017-12-12 DIAGNOSIS — E785 Hyperlipidemia, unspecified: Secondary | ICD-10-CM

## 2017-12-12 DIAGNOSIS — E1129 Type 2 diabetes mellitus with other diabetic kidney complication: Secondary | ICD-10-CM

## 2017-12-12 DIAGNOSIS — N401 Enlarged prostate with lower urinary tract symptoms: Secondary | ICD-10-CM | POA: Diagnosis not present

## 2017-12-12 DIAGNOSIS — R351 Nocturia: Secondary | ICD-10-CM

## 2017-12-12 MED ORDER — DAPAGLIFLOZIN PROPANEDIOL 10 MG PO TABS: 10.0000 mg | ORAL_TABLET | Freq: Every day | ORAL | 11 refills | Status: DC

## 2017-12-12 NOTE — Addendum Note (Signed)
Addended by: Rutherford Guys on: 12/12/2017 09:29 AM   Modules accepted: Orders

## 2017-12-12 NOTE — Patient Instructions (Signed)
° ° ° °  If you have lab work done today you will be contacted with your lab results within the next 2 weeks.  If you have not heard from us then please contact us. The fastest way to get your results is to register for My Chart. ° ° °IF you received an x-ray today, you will receive an invoice from [facility]. Please contact [facility] at [phone] with questions or concerns regarding your invoice.  ° °IF you received labwork today, you will receive an invoice from [facility]. Please contact [facility] at [phone] with questions or concerns regarding your invoice.  ° °Our billing staff will not be able to assist you with questions regarding bills from these companies. ° °You will be contacted with the lab results as soon as they are available. The fastest way to get your results is to activate your My Chart account. Instructions are located on the last page of this paperwork. If you have not heard from us regarding the results in 2 weeks, please contact this office. °  ° ° ° °

## 2017-12-12 NOTE — Progress Notes (Signed)
12/6/20198:58 AM  Al Corpus Jan 15, 1955, 62 y.o. male [id number]  Chief Complaint  Patient presents with  . Diabetes    3 month follow up and medication refill. One medication needs to be changed due to insurance. Still taking the onglyza, next refill will not be covered    HPI:   Patient is a 62 y.o. male with past medical history significant for DM2, HTN, HLP, glaucoma who presents today for routine followup  Last OV in Aug Added onglyza for DM Started afluzosin for BPH  Checking cbgs occ still 150-160s Tolerating onglyza well, but not covered [place] is also not covered Denies any h/o pancreatitis, DKA, recurrent UTI   Stopped taking afluzosin as he did not feel it was helping  Seen at urgent care on nov 22 for bronchitis, - resolved  Lab Results  Component Value Date   HGBA1C 7.9 (H) 12/06/2017   HGBA1C 8.0 (A) 07/07/2017   HGBA1C 9.3 03/12/2017   Lab Results  Component Value Date   MICROALBUR 23.4 11/06/2015   LDLCALC 70 07/07/2017   CREATININE 1.27 12/06/2017    Fall Risk  12/12/2017 08/13/2017 03/12/2017 11/06/2015  Falls in the past year? 0 No No No     Depression screen [facility] 2/9 12/12/2017 08/13/2017 03/12/2017  Decreased Interest 0 0 0  Down, Depressed, Hopeless 0 0 0  PHQ - 2 Score 0 0 0    Allergies  Allergen Reactions  . Septra [Bactrim] Nausea And Vomiting  . Sulfa Antibiotics Rash    Prior to Admission medications   Medication Sig Start Date End Date Taking? Authorizing Provider  alfuzosin (UROXATRAL) 10 MG 24 hr tablet TAKE 1 TABLET (10 MG TOTAL) BY MOUTH DAILY WITH BREAKFAST. 11/04/17  Yes Rutherford Guys, MD  amLODipine (NORVASC) 10 MG tablet Take 1 tablet (10 mg total) by mouth daily. 07/07/17  Yes Wardell Honour, MD  amoxicillin-clavulanate (AUGMENTIN) 875-125 MG tablet Take 1 tablet by mouth every 12 (twelve) hours. 11/28/17  Yes Robyn Haber, MD  HYDROcodone-homatropine (HYDROMET) 5-1.5 MG/5ML syrup Take 5 mLs by mouth every 6  (six) hours as needed for cough. 11/28/17  Yes Robyn Haber, MD  latanoprost (XALATAN) 0.005 % ophthalmic solution 1 drop at bedtime.   Yes [provider]  losartan (COZAAR) 25 MG tablet TAKE 1 TABLET BY MOUTH EVERY DAY 12/10/17  Yes Rutherford Guys, MD  metFORMIN (GLUCOPHAGE-XR) 500 MG 24 hr tablet Take 4 tablets (2,000 mg total) by mouth daily with breakfast. 07/07/17  Yes Wardell Honour, MD  rosuvastatin (CRESTOR) 20 MG tablet TAKE 1 TABLET BY MOUTH DAILY 01/20/17  Yes Wardell Honour, MD  ONGLYZA 5 MG TABS tablet TAKE 1 TABLET BY MOUTH EVERY DAY Patient not taking: Reported on 12/12/2017 11/04/17   Rutherford Guys, MD    Past Medical History:  Diagnosis Date  . Diabetes mellitus without complication ([place])   . Hyperlipidemia   . Hypertension     Past Surgical History:  Procedure Laterality Date  . CARPAL TUNNEL RELEASE Bilateral   . CATARACT EXTRACTION Right   . CHOLECYSTECTOMY  1990  . CYST REMOVAL NECK      Social History   Tobacco Use  . Smoking status: Former Smoker    Last attempt to quit: 04/07/1981    Years since quitting: 36.7  . Smokeless tobacco: Never Used  Substance Use Topics  . Alcohol use: No    Family History  Problem Relation Age of Onset  . Diabetes Mother   .  Hypertension Mother   . Diabetes Father   . Hypertension Father   . Diabetes Sister   . Hypertension Sister   . Cancer Sister 50       bone marrow cancer  . Diabetes Brother   . Hypertension Brother   . Colon cancer Neg Hx     Review of Systems  Constitutional: Negative for chills and fever.  Respiratory: Negative for cough and shortness of breath.   Cardiovascular: Negative for chest pain, palpitations and leg swelling.  Gastrointestinal: Negative for abdominal pain, nausea and vomiting.   Per hpi  OBJECTIVE:  Blood pressure 120/74, pulse 72, temperature 98.3 F (36.8 C), temperature source Oral, height 5' 2.91" (1.598 m), weight 149 lb 12.8 oz (67.9 kg), SpO2 96  %. Body mass index is 26.61 kg/m.   Wt Readings from Last 3 Encounters:  12/12/17 149 lb 12.8 oz (67.9 kg)  08/13/17 150 lb (68 kg)  07/07/17 151 lb 6.4 oz (68.7 kg)    Physical Exam  Constitutional: He is oriented to person, place, and time. He appears well-developed and well-nourished.  HENT:  Head: Normocephalic and atraumatic.  Mouth/Throat: Oropharynx is clear and moist.  Eyes: Pupils are equal, round, and reactive to light. Conjunctivae and EOM are normal.  Neck: Neck supple.  Cardiovascular: Normal rate and regular rhythm. Exam reveals no gallop and no friction rub.  No murmur heard. Pulmonary/Chest: Effort normal and breath sounds normal. He has no wheezes. He has no rales.  Musculoskeletal: He exhibits no edema.  Neurological: He is alert and oriented to person, place, and time.  Skin: Skin is warm and dry.  Psychiatric: He has a normal mood and affect.  Nursing note and vitals reviewed.   ASSESSMENT and PLAN  1. Type 2 diabetes mellitus with microalbuminuria, without long-term current use of insulin (HCC) Uncontrolled. Discussed treatment options r/se/b, changing to SGL1, DDP4 not covered. Discussed importance of low carb diet, regular exercise and healthy weight.   2. Essential hypertension Controlled. Continue current regime.   3. Hyperlipidemia, unspecified hyperlipidemia type Controlled. Continue current regime.   4. Benign prostatic hyperplasia with nocturia Not to bothersome, limited due to sulfa allergy. Will continue to monitor clinically  Other orders - dapagliflozin propanediol (FARXIGA) 10 MG TABS tablet; Take 10 mg by mouth daily.  Return in about 3 months (around 03/13/2018) for routine .    Rutherford Guys, MD Primary Care at [place], [place] [address] Ph.  [phone] Fax [phone]

## 2017-12-19 ENCOUNTER — Encounter: Payer: Self-pay | Admitting: Family Medicine

## 2017-12-19 NOTE — Progress Notes (Signed)
Diabetic eye exam - No diabetic retinopathy, no Hypertensive retinopathy

## 2018-03-09 ENCOUNTER — Ambulatory Visit: Payer: Federal, State, Local not specified - PPO

## 2018-03-09 DIAGNOSIS — I10 Essential (primary) hypertension: Secondary | ICD-10-CM | POA: Diagnosis not present

## 2018-03-09 DIAGNOSIS — E785 Hyperlipidemia, unspecified: Secondary | ICD-10-CM

## 2018-03-09 DIAGNOSIS — R809 Proteinuria, unspecified: Secondary | ICD-10-CM | POA: Diagnosis not present

## 2018-03-09 DIAGNOSIS — E1129 Type 2 diabetes mellitus with other diabetic kidney complication: Secondary | ICD-10-CM | POA: Diagnosis not present

## 2018-03-10 LAB — HEMOGLOBIN A1C
Est. average glucose Bld gHb Est-mCnc: 203 mg/dL
Hgb A1c MFr Bld: 8.7 % — ABNORMAL HIGH (ref 4.8–5.6)

## 2018-03-10 LAB — COMPREHENSIVE METABOLIC PANEL
ALT: 30 IU/L (ref 0–44)
AST: 30 IU/L (ref 0–40)
Albumin/Globulin Ratio: 1.5 (ref 1.2–2.2)
Albumin: 4.6 g/dL (ref 3.8–4.8)
Alkaline Phosphatase: 85 IU/L (ref 39–117)
BUN/Creatinine Ratio: 16 (ref 10–24)
BUN: 19 mg/dL (ref 8–27)
Bilirubin Total: 0.4 mg/dL (ref 0.0–1.2)
CO2: 23 mmol/L (ref 20–29)
Calcium: 9.3 mg/dL (ref 8.6–10.2)
Chloride: 103 mmol/L (ref 96–106)
Creatinine, Ser: 1.21 mg/dL (ref 0.76–1.27)
GFR calc Af Amer: 73 mL/min/{1.73_m2} (ref >59)
GFR calc non Af Amer: 63 mL/min/{1.73_m2} (ref >59)
Globulin, Total: 3.1 g/dL (ref 1.5–4.5)
Glucose: 119 mg/dL — ABNORMAL HIGH (ref 65–99)
Potassium: 4.4 mmol/L (ref 3.5–5.2)
Sodium: 141 mmol/L (ref 134–144)
Total Protein: 7.7 g/dL (ref 6.0–8.5)

## 2018-03-10 LAB — LIPID PANEL
Chol/HDL Ratio: 3.1 ratio (ref 0.0–5.0)
Cholesterol, Total: 134 mg/dL (ref 100–199)
HDL: 43 mg/dL (ref >39)
LDL Calculated: 77 mg/dL (ref 0–99)
Triglycerides: 71 mg/dL (ref 0–149)
VLDL Cholesterol Cal: 14 mg/dL (ref 5–40)

## 2018-03-13 ENCOUNTER — Ambulatory Visit (INDEPENDENT_AMBULATORY_CARE_PROVIDER_SITE_OTHER): Payer: Federal, State, Local not specified - PPO | Admitting: Family Medicine

## 2018-03-13 ENCOUNTER — Encounter: Payer: Self-pay | Admitting: Family Medicine

## 2018-03-13 ENCOUNTER — Other Ambulatory Visit: Payer: Self-pay

## 2018-03-13 VITALS — BP 126/72 | HR 82 | Temp 98.6°F | Ht 62.6 in | Wt 151.8 lb

## 2018-03-13 DIAGNOSIS — E785 Hyperlipidemia, unspecified: Secondary | ICD-10-CM

## 2018-03-13 DIAGNOSIS — R809 Proteinuria, unspecified: Secondary | ICD-10-CM

## 2018-03-13 DIAGNOSIS — E1129 Type 2 diabetes mellitus with other diabetic kidney complication: Secondary | ICD-10-CM | POA: Diagnosis not present

## 2018-03-13 DIAGNOSIS — Z125 Encounter for screening for malignant neoplasm of prostate: Secondary | ICD-10-CM | POA: Diagnosis not present

## 2018-03-13 DIAGNOSIS — I10 Essential (primary) hypertension: Secondary | ICD-10-CM

## 2018-03-13 DIAGNOSIS — R300 Dysuria: Secondary | ICD-10-CM

## 2018-03-13 DIAGNOSIS — Z0000 Encounter for general adult medical examination without abnormal findings: Secondary | ICD-10-CM

## 2018-03-13 DIAGNOSIS — Z0001 Encounter for general adult medical examination with abnormal findings: Secondary | ICD-10-CM | POA: Diagnosis not present

## 2018-03-13 LAB — POCT URINALYSIS DIP (MANUAL ENTRY)
Bilirubin, UA: NEGATIVE
Glucose, UA: NEGATIVE mg/dL
Ketones, POC UA: NEGATIVE mg/dL
Leukocytes, UA: NEGATIVE
Nitrite, UA: NEGATIVE
Protein Ur, POC: 100 mg/dL — AB
Spec Grav, UA: 1.02 (ref 1.010–1.025)
Urobilinogen, UA: 1 E.U./dL
pH, UA: 5.5 (ref 5.0–8.0)

## 2018-03-13 LAB — POC MICROSCOPIC URINALYSIS (UMFC): Mucus: ABSENT

## 2018-03-13 MED ORDER — AMLODIPINE BESYLATE 10 MG PO TABS: 10.0000 mg | ORAL_TABLET | Freq: Every day | ORAL | 1 refills | Status: DC

## 2018-03-13 MED ORDER — LOSARTAN POTASSIUM 25 MG PO TABS: 25.0000 mg | ORAL_TABLET | Freq: Every day | ORAL | 1 refills | Status: DC

## 2018-03-13 MED ORDER — ZOSTER VAC RECOMB ADJUVANTED 50 MCG/0.5ML IM SUSR: 0.5000 mL | Freq: Once | INTRAMUSCULAR | 1 refills | Status: AC

## 2018-03-13 MED ORDER — ROSUVASTATIN CALCIUM 20 MG PO TABS: 20.0000 mg | ORAL_TABLET | Freq: Every day | ORAL | 3 refills | Status: DC

## 2018-03-13 MED ORDER — METFORMIN HCL ER 500 MG PO TB24: 2000.0000 mg | ORAL_TABLET | Freq: Every day | ORAL | 1 refills | Status: DC

## 2018-03-13 MED ORDER — REPAGLINIDE 0.5 MG PO TABS: 0.5000 mg | ORAL_TABLET | Freq: Three times a day (TID) | ORAL | 0 refills | Status: DC

## 2018-03-13 NOTE — Progress Notes (Signed)
3/6/20204:05 PM  Jesse Baldwin [birth year], 63 y.o. male [id number]  Chief Complaint  Patient presents with  . Annual Exam    stopped taking the faxiga and th eonglyza, making him feel like he has a uti. has not taken the meds in one month. Taking medication repaglinide that he got from his brother 0.5mg     HPI:   Patient is a 63 y.o. male with past medical history significant for DM2, HTN, HLP who presents today for CPE  Colorectal Cancer Screening: due 04/2019 Prostate Cancer Screening: today HIV Screening: done Seasonal Influenza Vaccination: has had this season Td/Tdap Vaccination: due jan 2022 Pneumococcal Vaccination: 2017 Zoster Vaccination: at pharmacy of choice Frequency of Dental evaluation: Q6 months Frequency of Eye evaluation: yearly, due oct 2020, Dr Clydene Fake farxiga caused urinary symptoms - completely resolved after stopping medication, took for about a week Stopped onglyza as he was coughing and having dizzy spells, took for about a week Started replagadine 0.5mg  TID 3 weeks ago Checks sugars every morning, coming down since he started new medication, this morning 122 Still taking metformin   Has seen urology for microscopic hematuria, chronic, 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 0 0 No No No  Number falls in past yr: 0 - - - -  Injury with Fall? 0 - - - -     Depression screen [facility] 2/9 03/13/2018 12/12/2017 08/13/2017  Decreased Interest 0 0 0  Down, Depressed, Hopeless 0 0 0  PHQ - 2 Score 0 0 0    Allergies  Allergen Reactions  . Septra [Bactrim] Nausea And Vomiting  . Sulfa  Antibiotics Rash    Prior to Admission medications   Medication Sig Start Date End Date Taking? Authorizing Provider  amLODipine (NORVASC) 10 MG tablet Take 1 tablet (10 mg total) by mouth daily. 07/07/17  Yes Wardell Honour, MD  latanoprost (XALATAN) 0.005 % ophthalmic solution 1 drop at bedtime.   Yes [provider]  losartan (COZAAR) 25 MG tablet TAKE 1 TABLET BY MOUTH EVERY DAY 12/10/17  Yes Rutherford Guys, MD  metFORMIN (GLUCOPHAGE-XR) 500 MG 24 hr tablet Take 4 tablets (2,000 mg total) by mouth daily with breakfast. 07/07/17  Yes Wardell Honour, MD  rosuvastatin (CRESTOR) 20 MG tablet TAKE 1 TABLET BY MOUTH DAILY 01/20/17  Yes Wardell Honour, MD    Past Medical History:  Diagnosis Date  . Diabetes mellitus without complication ([place])   . Hyperlipidemia   . Hypertension     Past Surgical History:  Procedure Laterality Date  . CARPAL TUNNEL RELEASE Bilateral   . CATARACT EXTRACTION Right   . CHOLECYSTECTOMY  1990  . CYST REMOVAL NECK      Social History   Tobacco Use  . Smoking status: Former Smoker    Last attempt to quit: 04/07/1981    Years since quitting: 36.9  . Smokeless tobacco: Never Used  Substance Use Topics  . Alcohol use: No    Family History  Problem Relation Age of Onset  . Diabetes Mother   .  Hypertension Mother   . Diabetes Father   . Hypertension Father   . Diabetes Sister   . Hypertension Sister   . Cancer Sister 50       bone marrow cancer  . Diabetes Brother   . Hypertension Brother   . Colon cancer Neg Hx     Review of Systems  Constitutional: Negative for chills and fever.  Respiratory: Negative for cough and shortness of breath.   Cardiovascular: Negative for chest pain, palpitations and leg swelling.  Gastrointestinal: Negative for abdominal pain, nausea and vomiting.  All other systems reviewed and are negative.    OBJECTIVE:  Blood pressure 126/72, pulse 82, temperature 98.6 F (37 C), temperature source Oral, height  5' 2.6" (1.59 m), weight 151 lb 12.8 oz (68.9 kg), SpO2 96 %. Body mass index is 27.24 kg/m.   Physical Exam Vitals signs and nursing note reviewed.  Constitutional:      Appearance: He is well-developed.  HENT:     Head: Normocephalic and atraumatic.     Right Ear: Hearing, tympanic membrane, ear canal and external ear normal.     Left Ear: Hearing, tympanic membrane, ear canal and external ear normal.     Mouth/Throat:     Pharynx: No oropharyngeal exudate.  Eyes:     Conjunctiva/sclera: Conjunctivae normal.     Pupils: Pupils are equal, round, and reactive to light.  Neck:     Musculoskeletal: Neck supple.     Thyroid: No thyromegaly.  Cardiovascular:     Rate and Rhythm: Normal rate and regular rhythm.     Heart sounds: Normal heart sounds. No murmur. No friction rub. No gallop.   Pulmonary:     Effort: Pulmonary effort is normal.     Breath sounds: Normal breath sounds. No wheezing, rhonchi or rales.  Abdominal:     General: Bowel sounds are normal. There is no distension.     Palpations: Abdomen is soft. There is no hepatomegaly, splenomegaly or mass.     Tenderness: There is no abdominal tenderness.  Musculoskeletal: Normal range of motion.     Right lower leg: No edema.     Left lower leg: No edema.  Lymphadenopathy:     Cervical: No cervical adenopathy.  Skin:    General: Skin is warm and dry.  Neurological:     Mental Status: He is alert and oriented to person, place, and time.     Cranial Nerves: No cranial nerve deficit.     Coordination: Coordination normal.     Gait: Gait normal.     Deep Tendon Reflexes: Reflexes are normal and symmetric.  Psychiatric:        Mood and Affect: Mood normal.     Results for orders placed or performed in visit on 03/13/18 (from the past 24 hour(s))  POCT urinalysis dipstick     Status: Abnormal   Collection Time: 03/13/18  4:13 PM  Result Value Ref Range   Color, UA yellow yellow   Clarity, UA clear clear   Glucose, UA  negative negative mg/dL   Bilirubin, UA negative negative   Ketones, POC UA negative negative mg/dL   Spec Grav, UA 1.020 1.010 - 1.025   Blood, UA moderate (A) negative   pH, UA 5.5 5.0 - 8.0   Protein Ur, POC =100 (A) negative mg/dL   Urobilinogen, UA 1.0 0.2 or 1.0 E.U./dL   Nitrite, UA Negative Negative   Leukocytes, UA Negative Negative  POCT Microscopic Urinalysis ([facility])  Status: Abnormal   Collection Time: 03/13/18  4:24 PM  Result Value Ref Range   WBC,UR,HPF,POC None None WBC/hpf   RBC,UR,HPF,POC Moderate (A) None RBC/hpf   Bacteria Few (A) None, Too numerous to count   Mucus Absent Absent   Epithelial Cells, UR Per Microscopy None None, Too numerous to count cells/hpf     ASSESSMENT and PLAN  1. Annual physical exam Routine HCM labs ordered. HCM reviewed/discussed. Anticipatory guidance regarding healthy weight, lifestyle and choices given.   2. Dysuria This was related to farxiga, resolved after stopping medication - POCT urinalysis dipstick - POCT Microscopic Urinalysis ([facility])  3. Screening for prostate cancer - PSA  4. Type 2 diabetes mellitus with microalbuminuria, without long-term current use of insulin ([facility]) Uncontrolled. Off meds until he recently started his brother's prandin. Cbgs have been coming down. Will continue with current regime. Recheck in 3 months - Microalbumin/Creatinine Ratio, Urine - Hemoglobin A1c; Future - Basic Metabolic Panel; Future  5. Essential hypertension Controlled. Continue current regime.   6. Hyperlipidemia, unspecified hyperlipidemia type Controlled. Continue current regime.   Other orders - repaglinide (PRANDIN) 0.5 MG tablet; Take 1 tablet (0.5 mg total) by mouth 3 (three) times daily before meals. - Zoster Vaccine Adjuvanted [facility]) injection; Inject 0.5 mLs into the muscle once for 1 dose. Repeat 2nd doe after 2 months - amLODipine (NORVASC) 10 MG tablet; Take 1 tablet (10 mg total) by mouth daily. - losartan  (COZAAR) 25 MG tablet; Take 1 tablet (25 mg total) by mouth daily. - metFORMIN (GLUCOPHAGE-XR) 500 MG 24 hr tablet; Take 4 tablets (2,000 mg total) by mouth daily with breakfast. - rosuvastatin (CRESTOR) 20 MG tablet; Take 1 tablet (20 mg total) by mouth daily.    Return in about 3 months (around 06/13/2018).    Rutherford Guys, MD [facility] [place], [place] [address] Ph.  [phone] Fax [phone]

## 2018-03-13 NOTE — Patient Instructions (Addendum)
Preventive Care 40-64 Years, Male Preventive care refers to lifestyle choices and visits with your health care provider that can promote health and wellness. What does preventive care include?   A yearly physical exam. This is also called an annual well check.  Dental exams once or twice a year.  Routine eye exams. Ask your health care provider how often you should have your eyes checked.  Personal lifestyle choices, including: ? Daily care of your teeth and gums. ? Regular physical activity. ? Eating a healthy diet. ? Avoiding tobacco and drug use. ? Limiting alcohol use. ? Practicing safe sex. ? Taking low-dose aspirin every day starting at age 61. What happens during an annual well check? The services and screenings done by your health care provider during your annual well check will depend on your age, overall health, lifestyle risk factors, and family history of disease. Counseling Your health care provider may ask you questions about your:  Alcohol use.  Tobacco use.  Drug use.  Emotional well-being.  Home and relationship well-being.  Sexual activity.  Eating habits.  Work and work Statistician. Screening You may have the following tests or measurements:  Height, weight, and BMI.  Blood pressure.  Lipid and cholesterol levels. These may be checked every 5 years, or more frequently if you are over 46 years old.  Skin check.  Lung cancer screening. You may have this screening every year starting at age 37 if you have a 30-pack-year history of smoking and currently smoke or have quit within the past 15 years.  Colorectal cancer screening. All adults should have this screening starting at age 33 and continuing until age 29. Your health care provider may recommend screening at age 64. You will have tests every 1-10 years, depending on your results and the type of screening test. People at increased risk should start screening at an earlier age. Screening tests may  include: ? Guaiac-based fecal occult blood testing. ? Fecal immunochemical test (FIT). ? Stool DNA test. ? Virtual colonoscopy. ? Sigmoidoscopy. During this test, a flexible tube with a tiny camera (sigmoidoscope) is used to examine your rectum and lower colon. The sigmoidoscope is inserted through your anus into your rectum and lower colon. ? Colonoscopy. During this test, a long, thin, flexible tube with a tiny camera (colonoscope) is used to examine your entire colon and rectum.  Prostate cancer screening. Recommendations will vary depending on your family history and other risks.  Hepatitis C blood test.  Hepatitis B blood test.  Sexually transmitted disease (STD) testing.  Diabetes screening. This is done by checking your blood sugar (glucose) after you have not eaten for a while (fasting). You may have this done every 1-3 years. Discuss your test results, treatment options, and if necessary, the need for more tests with your health care provider. Vaccines Your health care provider may recommend certain vaccines, such as:  Influenza vaccine. This is recommended every year.  Tetanus, diphtheria, and acellular pertussis (Tdap, Td) vaccine. You may need a Td booster every 10 years.  Varicella vaccine. You may need this if you have not been vaccinated.  Zoster vaccine. You may need this after age 81.  Measles, mumps, and rubella (MMR) vaccine. You may need at least one dose of MMR if you were born in 1957 or later. You may also need a second dose.  Pneumococcal 13-valent conjugate (PCV13) vaccine. You may need this if you have certain conditions and have not been vaccinated.  Pneumococcal polysaccharide (PPSV23) vaccine.  You may need one or two doses if you smoke cigarettes or if you have certain conditions.  Meningococcal vaccine. You may need this if you have certain conditions.  Hepatitis A vaccine. You may need this if you have certain conditions or if you travel or work in  places where you may be exposed to hepatitis A.  Hepatitis B vaccine. You may need this if you have certain conditions or if you travel or work in places where you may be exposed to hepatitis B.  Haemophilus influenzae type b (Hib) vaccine. You may need this if you have certain risk factors. Talk to your health care provider about which screenings and vaccines you need and how often you need them. This information is not intended to replace advice given to you by your health care provider. Make sure you discuss any questions you have with your health care provider. Document Released: 01/20/2015 Document Revised: 02/13/2017 Document Reviewed: 10/25/2014 Elsevier Interactive Patient Education  [employer].     If you have lab work done today you will be contacted with your lab results within the next 2 weeks.  If you have not heard from [place] then please contact us. The fastest way to get your results is to register for My Chart.   IF you received an x-ray today, you will receive an invoice from [facility]. Please contact [facility] at [phone] with questions or concerns regarding your invoice.   IF you received labwork today, you will receive an invoice from [place]. Please contact [facility] at [phone] with questions or concerns regarding your invoice.   Our billing staff will not be able to assist you with questions regarding bills from these companies.  You will be contacted with the lab results as soon as they are available. The fastest way to get your results is to activate your My Chart account. Instructions are located on the last page of this paperwork. If you have not heard from [place] regarding the results in 2 weeks, please contact this office.

## 2018-03-14 LAB — MICROALBUMIN / CREATININE URINE RATIO
Creatinine, Urine: 123.8 mg/dL
Microalb/Creat Ratio: 187 mg/g creat — ABNORMAL HIGH (ref 0–29)
Microalbumin, Urine: 231.8 ug/mL

## 2018-03-14 LAB — PSA: Prostate Specific Ag, Serum: 2 ng/mL (ref 0.0–4.0)

## 2018-03-30 ENCOUNTER — Other Ambulatory Visit: Payer: Self-pay | Admitting: Family Medicine

## 2018-03-30 MED ORDER — AMLODIPINE BESYLATE 5 MG PO TABS: 5.0000 mg | ORAL_TABLET | Freq: Every day | ORAL | 1 refills | Status: DC

## 2018-03-30 MED ORDER — LOSARTAN POTASSIUM 50 MG PO TABS: 50.0000 mg | ORAL_TABLET | Freq: Every day | ORAL | 1 refills | Status: DC

## 2018-04-03 ENCOUNTER — Telehealth: Payer: Self-pay | Admitting: Family Medicine

## 2018-04-03 NOTE — Telephone Encounter (Signed)
Copied from [id number]. Topic: Quick Communication - Rx Refill/Question >> Apr 02, 2018  4:31 PM Alanda Slim E wrote: Medication: amLODipine (NORVASC) 5 MG tablet Pt went to get medication and it was for 5mg  not 10mg  that he normally takes/ please advise if the 10 MG was changed or if the 5mg  was sent by mistake

## 2018-04-08 ENCOUNTER — Other Ambulatory Visit: Payer: Self-pay

## 2018-04-08 MED ORDER — AMLODIPINE BESYLATE 5 MG PO TABS: 10.0000 mg | ORAL_TABLET | Freq: Every day | ORAL | 1 refills | Status: DC

## 2018-04-08 NOTE — Telephone Encounter (Signed)
Saw where was changed to 10 mg and sent to pharmacy.

## 2018-04-23 ENCOUNTER — Other Ambulatory Visit: Payer: Self-pay | Admitting: Family Medicine

## 2018-04-23 NOTE — Telephone Encounter (Signed)
Pt wife called in stating pt has lost the medication and needs a Rx refill. Pt wife stated that they will continue to look for the medication but they would like to know if Dr. Pamella Pert will approve the request and send it to their pharmacy.

## 2018-04-23 NOTE — Telephone Encounter (Signed)
Keep appointment

## 2018-06-08 ENCOUNTER — Other Ambulatory Visit: Payer: Self-pay

## 2018-06-08 ENCOUNTER — Ambulatory Visit: Payer: Federal, State, Local not specified - PPO

## 2018-06-08 DIAGNOSIS — E1129 Type 2 diabetes mellitus with other diabetic kidney complication: Secondary | ICD-10-CM | POA: Diagnosis not present

## 2018-06-08 DIAGNOSIS — R809 Proteinuria, unspecified: Secondary | ICD-10-CM | POA: Diagnosis not present

## 2018-06-09 LAB — HEMOGLOBIN A1C
Est. average glucose Bld gHb Est-mCnc: 166 mg/dL
Hgb A1c MFr Bld: 7.4 % — ABNORMAL HIGH (ref 4.8–5.6)

## 2018-06-09 LAB — BASIC METABOLIC PANEL
BUN/Creatinine Ratio: 17 (ref 10–24)
BUN: 18 mg/dL (ref 8–27)
CO2: 25 mmol/L (ref 20–29)
Calcium: 9.5 mg/dL (ref 8.6–10.2)
Chloride: 102 mmol/L (ref 96–106)
Creatinine, Ser: 1.04 mg/dL (ref 0.76–1.27)
GFR calc Af Amer: 87 mL/min/{1.73_m2} (ref >59)
GFR calc non Af Amer: 76 mL/min/{1.73_m2} (ref >59)
Glucose: 136 mg/dL — ABNORMAL HIGH (ref 65–99)
Potassium: 4.1 mmol/L (ref 3.5–5.2)
Sodium: 141 mmol/L (ref 134–144)

## 2018-06-11 ENCOUNTER — Other Ambulatory Visit: Payer: Self-pay

## 2018-06-11 ENCOUNTER — Encounter: Payer: Self-pay | Admitting: Family Medicine

## 2018-06-11 ENCOUNTER — Ambulatory Visit: Payer: Federal, State, Local not specified - PPO | Admitting: Family Medicine

## 2018-06-11 VITALS — BP 126/80 | HR 96 | Temp 99.0°F

## 2018-06-11 DIAGNOSIS — E1129 Type 2 diabetes mellitus with other diabetic kidney complication: Secondary | ICD-10-CM | POA: Diagnosis not present

## 2018-06-11 DIAGNOSIS — R809 Proteinuria, unspecified: Secondary | ICD-10-CM

## 2018-06-11 DIAGNOSIS — I10 Essential (primary) hypertension: Secondary | ICD-10-CM

## 2018-06-11 DIAGNOSIS — E785 Hyperlipidemia, unspecified: Secondary | ICD-10-CM

## 2018-06-11 MED ORDER — REPAGLINIDE 1 MG PO TABS: 1.0000 mg | ORAL_TABLET | Freq: Three times a day (TID) | ORAL | 3 refills | Status: DC

## 2018-06-11 MED ORDER — AMLODIPINE BESYLATE 5 MG PO TABS: 5.0000 mg | ORAL_TABLET | Freq: Every day | ORAL | 1 refills | Status: DC

## 2018-06-11 NOTE — Patient Instructions (Addendum)
Please call with actual dose of amlodipine    Hypoglycemia Hypoglycemia is when the sugar (glucose) level in your blood is too low. Signs of low blood sugar may include:  Feeling: ? Hungry. ? Worried or nervous (anxious). ? Sweaty and clammy. ? Confused. ? Dizzy. ? Sleepy. ? Sick to your stomach (nauseous).  Having: ? A fast heartbeat. ? A headache. ? A change in your vision. ? Tingling or no feeling (numbness) around your mouth, lips, or tongue. ? Jerky movements that you cannot control (seizure).  Having trouble with: ? Moving (coordination). ? Sleeping. ? Passing out (fainting). ? Getting upset easily (irritability). Low blood sugar can happen to people who have diabetes and people who do not have diabetes. Low blood sugar can happen quickly, and it can be an emergency. Treating low blood sugar Low blood sugar is often treated by eating or drinking something sugary right away, such as:  Fruit juice, 4-6 oz (120-150 mL).  Regular soda (not diet soda), 4-6 oz (120-150 mL).  Low-fat milk, 4 oz (120 mL).  Several pieces of hard candy.  Sugar or honey, 1 Tbsp (15 mL). Treating low blood sugar if you have diabetes If you can think clearly and swallow safely, follow the 15:15 rule:  Take 15 grams of a fast-acting carb (carbohydrate). Talk with your doctor about how much you should take.  Always keep a source of fast-acting carb with you, such as: ? Sugar tablets (glucose pills). Take 3-4 pills. ? 6-8 pieces of hard candy. ? 4-6 oz (120-150 mL) of fruit juice. ? 4-6 oz (120-150 mL) of regular (not diet) soda. ? 1 Tbsp (15 mL) honey or sugar.  Check your blood sugar 15 minutes after you take the carb.  If your blood sugar is still at or below 70 mg/dL (3.9 mmol/L), take 15 grams of a carb again.  If your blood sugar does not go above 70 mg/dL (3.9 mmol/L) after 3 tries, get help right away.  After your blood sugar goes back to normal, eat a meal or a snack within  1 hour.  Treating very low blood sugar If your blood sugar is at or below 54 mg/dL (3 mmol/L), you have very low blood sugar (severe hypoglycemia). This may also cause:  Passing out.  Jerky movements you cannot control (seizure).  Losing consciousness (coma). This is an emergency. Do not wait to see if the symptoms will go away. Get medical help right away. Call your local emergency services (911 in the [place] Do not drive yourself to the hospital. If you have very low blood sugar and you cannot eat or drink, you may need a glucagon shot (injection). A family member or friend should learn how to check your blood sugar and how to give you a glucagon shot. Ask your doctor if you need to have a glucagon shot kit at home. Follow these instructions at home: General instructions  Take over-the-counter and prescription medicines only as told by your doctor.  Stay aware of your blood sugar as told by your doctor.  Limit alcohol intake to no more than 1 drink a day for nonpregnant women and 2 drinks a day for men. One drink equals 12 oz of beer (355 mL), 5 oz of wine (148 mL), or 1 oz of hard liquor (44 mL).  Keep all follow-up visits as told by your doctor. This is important. If you have diabetes:   Follow your diabetes care plan as told by your doctor. Make  sure you: ? Know the signs of low blood sugar. ? Take your medicines as told. ? Follow your exercise and meal plan. ? Eat on time. Do not skip meals. ? Check your blood sugar as often as told by your doctor. Always check it before and after exercise. ? Follow your sick day plan when you cannot eat or drink normally. Make this plan ahead of time with your doctor.  Share your diabetes care plan with: ? Your work or school. ? People you live with.  Check your pee (urine) for ketones: ? When you are sick. ? As told by your doctor.  Carry a card or wear jewelry that says you have diabetes. Contact a doctor if:  You have trouble  keeping your blood sugar in your target range.  You have low blood sugar often. Get help right away if:  You still have symptoms after you eat or drink something sugary.  Your blood sugar is at or below 54 mg/dL (3 mmol/L).  You have jerky movements that you cannot control.  You pass out. These symptoms may be an emergency. Do not wait to see if the symptoms will go away. Get medical help right away. Call your local emergency services (911 in the [place] Do not drive yourself to the hospital. Summary  Hypoglycemia happens when the level of sugar (glucose) in your blood is too low.  Low blood sugar can happen to people who have diabetes and people who do not have diabetes. Low blood sugar can happen quickly, and it can be an emergency.  Make sure you know the signs of low blood sugar and know how to treat it.  Always keep a source of sugar (fast-acting carb) with you to treat low blood sugar. This information is not intended to replace advice given to you by your health care provider. Make sure you discuss any questions you have with your health care provider. Document Released: 03/20/2009 Document Revised: 06/17/2017 Document Reviewed: 01/27/2015 Elsevier Interactive Patient Education  [employer].     If you have lab work done today you will be contacted with your lab results within the next 2 weeks.  If you have not heard from [place] then please contact us. The fastest way to get your results is to register for My Chart.   IF you received an x-ray today, you will receive an invoice from [facility]. Please contact [facility]iology at [phone] with questions or concerns regarding your invoice.   IF you received labwork today, you will receive an invoice from [place]. Please contact [facility] at [phone] with questions or concerns regarding your invoice.   Our billing staff will not be able to assist you with questions regarding bills from these  companies.  You will be contacted with the lab results as soon as they are available. The fastest way to get your results is to activate your My Chart account. Instructions are located on the last page of this paperwork. If you have not heard from [place] regarding the results in 2 weeks, please contact this office.

## 2018-06-11 NOTE — [place] Notes (Signed)
6/4/20205:03 PM  Jesse Baldwin Oct 25, 1955, 63 y.o., male [id number]  Chief Complaint  Patient presents with  . Diabetes    wants to get results of labs  . Hypertension  . Hyperlipidemia    HPI:   Patient is a 63 y.o. male with past medical history significant for DM2, HTN, HLP who presents today for routine followup  Last OV march 2020 Started on prandin 0.5mg  TID Increased losartan to 50mg  Patient is not sure dose of amlodipine, plan was to decrease amlodipine to 5mg   Checks cbgs fasting, lowest 88, highest 136 Tolerating meds well Has been working on diet and exercise Reports one episode of hypoglycemia related to not eating  He will be getting second [facility]rix [facility] this month [facility]  Lab Results  Component Value Date   HGBA1C 7.4 (H) 06/08/2018   HGBA1C 8.7 (H) 03/09/2018   HGBA1C 7.9 (H) 12/06/2017   Lab Results  Component Value Date   MICROALBUR 23.4 11/06/2015   LDLCALC 77 03/09/2018   CREATININE 1.04 06/08/2018    Fall Risk  06/11/2018 03/13/2018 12/12/2017 08/13/2017 03/12/2017  Falls in the past year? 0 0 0 No No  Number falls in past yr: 0 0 - - -  Injury with Fall? 0 0 - - -     Depression screen [facility] 2/9 06/11/2018 03/13/2018 12/12/2017  Decreased Interest 0 0 0  Down, Depressed, Hopeless 0 0 0  PHQ - 2 Score 0 0 0    Allergies  Allergen Reactions  . Septra [Bactrim] Nausea And Vomiting  . Sulfa Antibiotics Rash    Prior to Admission medications   Medication Sig Start Date End Date Taking? Authorizing Provider  amLODipine (NORVASC) 5 MG tablet Take 2 tablets (10 mg total) by mouth daily. 04/08/18  Yes Rutherford Guys, MD  latanoprost (XALATAN) 0.005 % ophthalmic solution 1 drop at bedtime.   Yes [provider]  losartan (COZAAR) 50 MG tablet Take 1 tablet (50 mg total) by mouth daily. 03/30/18  Yes Rutherford Guys, MD  metFORMIN (GLUCOPHAGE-XR) 500 MG 24 hr tablet Take 4 tablets (2,000 mg total) by mouth daily with breakfast. 03/13/18  Yes  Rutherford Guys, MD  repaglinide (PRANDIN) 0.5 MG tablet TAKE 1 TABLET BY MOUTH 3 TIMES A DAY BEFORE MEALS 04/23/18  Yes Rutherford Guys, MD  rosuvastatin (CRESTOR) 20 MG tablet Take 1 tablet (20 mg total) by mouth daily. 03/13/18  Yes Rutherford Guys, MD    Past Medical History:  Diagnosis Date  . Diabetes mellitus without complication ([place])   . Hyperlipidemia   . Hypertension     Past Surgical History:  Procedure Laterality Date  . CARPAL TUNNEL RELEASE Bilateral   . CATARACT EXTRACTION Right   . CHOLECYSTECTOMY  1990  . CYST REMOVAL NECK      Social History   Tobacco Use  . Smoking status: Former Smoker    Last attempt to quit: 04/07/1981    Years since quitting: 37.2  . Smokeless tobacco: Never Used  Substance Use Topics  . Alcohol use: No    Family History  Problem Relation Age of Onset  . Diabetes Mother   . Hypertension Mother   . Diabetes Father   . Hypertension Father   . Diabetes Sister   . Hypertension Sister   . Cancer Sister 50       bone marrow cancer  . Diabetes Brother   . Hypertension Brother   . Colon cancer Neg Hx  Review of Systems  Constitutional: Negative for chills and fever.  Respiratory: Negative for cough and shortness of breath.   Cardiovascular: Negative for chest pain, palpitations and leg swelling.  Gastrointestinal: Negative for abdominal pain, nausea and vomiting.  per hpi   OBJECTIVE:  Today's Vitals   06/11/18 1539  BP: 126/80  Pulse: 96  Temp: 99 F (37.2 C)  TempSrc: Oral  SpO2: 97%   There is no height or weight on file to calculate BMI.   Physical Exam Vitals signs and nursing note reviewed.  Constitutional:      Appearance: He is well-developed.  HENT:     Head: Normocephalic and atraumatic.  Eyes:     Conjunctiva/sclera: Conjunctivae normal.     Pupils: Pupils are equal, round, and reactive to light.  Neck:     Musculoskeletal: Neck supple.  Cardiovascular:     Rate and Rhythm: Normal rate and  regular rhythm.     Heart sounds: No murmur. No friction rub. No gallop.   Pulmonary:     Effort: Pulmonary effort is normal.     Breath sounds: Normal breath sounds. No wheezing or rales.  Skin:    General: Skin is warm and dry.  Neurological:     Mental Status: He is alert and oriented to person, place, and time.     ASSESSMENT and PLAN  1. Type 2 diabetes mellitus with microalbuminuria, without long-term current use of insulin (HCC) Improved. Increase prandin to 1mg  TID, continue with metformin. Discussed LFM and ssx/mgt of hypoglycemia Would like to further increase losartan if tolerated, asked that patient call back with actual dose of amlodipine  2. Essential hypertension At goal. See #1  3. Hyperlipidemia, unspecified hyperlipidemia type Controlled. Continue current regime.   Other orders - repaglinide (PRANDIN) 1 MG tablet; Take 1 tablet (1 mg total) by mouth 3 (three) times daily before meals. - amLODipine (NORVASC) 5 MG tablet; Take 1 tablet (5 mg total) by mouth daily.  Return in about 3 months (around 09/11/2018).    Rutherford Guys, MD Primary Care at [place], [place] [address] Ph.  [phone] Fax [phone]

## 2018-06-17 ENCOUNTER — Other Ambulatory Visit: Payer: Self-pay | Admitting: Family Medicine

## 2018-07-16 ENCOUNTER — Telehealth: Payer: Self-pay | Admitting: Family Medicine

## 2018-07-16 DIAGNOSIS — E1129 Type 2 diabetes mellitus with other diabetic kidney complication: Secondary | ICD-10-CM

## 2018-07-16 DIAGNOSIS — R809 Proteinuria, unspecified: Secondary | ICD-10-CM

## 2018-07-16 NOTE — Telephone Encounter (Signed)
I have scheduled patient a 3 month f/u on September 1st and he is wanting to know if he needs to do bloodwork before his appt like usual. Pt would like a call back once orders are in.

## 2018-07-25 NOTE — Telephone Encounter (Signed)
Pt had labs on 06/08/2018.  Will he need any additional labs before his Sept. appt with you, if so please advise and will contact pt to schedule nurse visit for labs.

## 2018-07-27 NOTE — Telephone Encounter (Signed)
Please inform patient that I have ordered labs to be done 3-4 days prior to our appointment in Sept. thanks

## 2018-09-02 ENCOUNTER — Ambulatory Visit (INDEPENDENT_AMBULATORY_CARE_PROVIDER_SITE_OTHER): Payer: Federal, State, Local not specified - PPO | Admitting: Emergency Medicine

## 2018-09-02 ENCOUNTER — Other Ambulatory Visit: Payer: Self-pay

## 2018-09-02 DIAGNOSIS — E1129 Type 2 diabetes mellitus with other diabetic kidney complication: Secondary | ICD-10-CM

## 2018-09-02 DIAGNOSIS — R809 Proteinuria, unspecified: Secondary | ICD-10-CM

## 2018-09-03 ENCOUNTER — Other Ambulatory Visit: Payer: Self-pay | Admitting: Family Medicine

## 2018-09-03 LAB — HEMOGLOBIN A1C
Est. average glucose Bld gHb Est-mCnc: 174 mg/dL
Hgb A1c MFr Bld: 7.7 % — ABNORMAL HIGH (ref 4.8–5.6)

## 2018-09-03 LAB — BASIC METABOLIC PANEL
BUN/Creatinine Ratio: 17 (ref 10–24)
BUN: 20 mg/dL (ref 8–27)
CO2: 22 mmol/L (ref 20–29)
Calcium: 9.8 mg/dL (ref 8.6–10.2)
Chloride: 101 mmol/L (ref 96–106)
Creatinine, Ser: 1.16 mg/dL (ref 0.76–1.27)
GFR calc Af Amer: 77 mL/min/{1.73_m2} (ref >59)
GFR calc non Af Amer: 66 mL/min/{1.73_m2} (ref >59)
Glucose: 131 mg/dL — ABNORMAL HIGH (ref 65–99)
Potassium: 4.2 mmol/L (ref 3.5–5.2)
Sodium: 141 mmol/L (ref 134–144)

## 2018-09-08 ENCOUNTER — Other Ambulatory Visit: Payer: Self-pay

## 2018-09-08 ENCOUNTER — Ambulatory Visit: Payer: Federal, State, Local not specified - PPO | Admitting: Family Medicine

## 2018-09-08 ENCOUNTER — Encounter: Payer: Self-pay | Admitting: Family Medicine

## 2018-09-08 VITALS — BP 136/84 | HR 89 | Temp 98.3°F | Ht 62.6 in | Wt 156.4 lb

## 2018-09-08 DIAGNOSIS — I10 Essential (primary) hypertension: Secondary | ICD-10-CM | POA: Diagnosis not present

## 2018-09-08 DIAGNOSIS — E1129 Type 2 diabetes mellitus with other diabetic kidney complication: Secondary | ICD-10-CM | POA: Diagnosis not present

## 2018-09-08 DIAGNOSIS — R809 Proteinuria, unspecified: Secondary | ICD-10-CM | POA: Diagnosis not present

## 2018-09-08 DIAGNOSIS — E785 Hyperlipidemia, unspecified: Secondary | ICD-10-CM | POA: Diagnosis not present

## 2018-09-08 MED ORDER — METFORMIN HCL ER 750 MG PO TB24: 750.0000 mg | ORAL_TABLET | Freq: Every day | ORAL | 1 refills | Status: DC

## 2018-09-08 MED ORDER — LOSARTAN POTASSIUM 100 MG PO TABS: 100.0000 mg | ORAL_TABLET | Freq: Every day | ORAL | 1 refills | Status: DC

## 2018-09-08 NOTE — [place] Notes (Signed)
9/1/20208:11 AM  Jesse Baldwin Mar 24, 1955, 63 y.o., male [id number]  Chief Complaint  Patient presents with  . Hypertension  . Diabetes    HPI:   Patient is a 63 y.o. male with past medical history significant for DM2, HTN, HLP who presents today for routine followup  Last OV June 2020 Has completed shingrix vaccine series prandin increased to 1mg  TID  He has not had his metformin recalled Tolerating increased prandin 1mg  Checks cbgs fasting occasionally, yesterday 126 Denies any low cbgs Struggles with portions and dietary changes Checks BP at home occasionally, similar to today Declines flu vaccine today, likes to get it in october  Wt Readings from Last 3 Encounters:  09/08/18 156 lb 6.4 oz (70.9 kg)  03/13/18 151 lb 12.8 oz (68.9 kg)  12/12/17 149 lb 12.8 oz (67.9 kg)   Lab Results  Component Value Date   HGBA1C 7.7 (H) 09/02/2018   HGBA1C 7.4 (H) 06/08/2018   HGBA1C 8.7 (H) 03/09/2018   Lab Results  Component Value Date   MICROALBUR 23.4 11/06/2015   LDLCALC 77 03/09/2018   CREATININE 1.16 09/02/2018   Lab Results  Component Value Date   CREATININE 1.16 09/02/2018   BUN 20 09/02/2018   NA 141 09/02/2018   K 4.2 09/02/2018   CL 101 09/02/2018   CO2 22 09/02/2018    Depression screen PHQ 2/9 09/08/2018 06/11/2018 03/13/2018  Decreased Interest 0 0 0  Down, Depressed, Hopeless 0 0 0  PHQ - 2 Score 0 0 0    Fall Risk  09/08/2018 06/11/2018 03/13/2018 12/12/2017 08/13/2017  Falls in the past year? 0 0 0 0 No  Number falls in past yr: 0 0 0 - -  Injury with Fall? 0 0 0 - -     Allergies  Allergen Reactions  . Septra [Bactrim] Nausea And Vomiting  . Sulfa Antibiotics Rash    Prior to Admission medications   Medication Sig Start Date End Date Taking? Authorizing Provider  amLODipine (NORVASC) 10 MG tablet TAKE 1 TABLET BY MOUTH EVERY DAY 09/03/18  Yes Rutherford Guys, MD  amLODipine (NORVASC) 5 MG tablet Take 1 tablet (5 mg total) by mouth daily. 06/11/18   Yes Rutherford Guys, MD  latanoprost (XALATAN) 0.005 % ophthalmic solution 1 drop at bedtime.   Yes [provider]  losartan (COZAAR) 50 MG tablet Take 1 tablet (50 mg total) by mouth daily. 03/30/18  Yes Rutherford Guys, MD  metFORMIN (GLUCOPHAGE-XR) 500 MG 24 hr tablet TAKE 4 TABLETS BY MOUTH DAILY WITH BREAKFAST 09/03/18  Yes Rutherford Guys, MD  repaglinide (PRANDIN) 1 MG tablet TAKE 1 TABLET (1 MG TOTAL) BY MOUTH 3 (THREE) TIMES DAILY BEFORE MEALS. 09/03/18  Yes Rutherford Guys, MD  rosuvastatin (CRESTOR) 20 MG tablet Take 1 tablet (20 mg total) by mouth daily. 03/13/18  Yes Rutherford Guys, MD    Past Medical History:  Diagnosis Date  . Diabetes mellitus without complication ([place])   . Hyperlipidemia   . Hypertension     Past Surgical History:  Procedure Laterality Date  . CARPAL TUNNEL RELEASE Bilateral   . CATARACT EXTRACTION Right   . CHOLECYSTECTOMY  1990  . CYST REMOVAL NECK      Social History   Tobacco Use  . Smoking status: Former Smoker    Quit date: 04/07/1981    Years since quitting: 37.4  . Smokeless tobacco: Never Used  Substance Use Topics  . Alcohol use: No  Family History  Problem Relation Age of Onset  . Diabetes Mother   . Hypertension Mother   . Diabetes Father   . Hypertension Father   . Diabetes Sister   . Hypertension Sister   . Cancer Sister 50       bone marrow cancer  . Diabetes Brother   . Hypertension Brother   . Colon cancer Neg Hx     Review of Systems  Constitutional: Negative for chills and fever.  Respiratory: Negative for cough and shortness of breath.   Cardiovascular: Negative for chest pain, palpitations and leg swelling.  Gastrointestinal: Negative for abdominal pain, nausea and vomiting.     OBJECTIVE:  Today's Vitals   09/08/18 0808  BP: 136/84  Pulse: 89  Temp: 98.3 F (36.8 C)  SpO2: 97%  Weight: 156 lb 6.4 oz (70.9 kg)  Height: 5' 2.6" (1.59 m)   Body mass index is 28.06 kg/m.   Physical  Exam Vitals signs and nursing note reviewed.  Constitutional:      Appearance: He is well-developed.  HENT:     Head: Normocephalic and atraumatic.  Eyes:     Conjunctiva/sclera: Conjunctivae normal.     Pupils: Pupils are equal, round, and reactive to light.  Neck:     Musculoskeletal: Neck supple.  Cardiovascular:     Rate and Rhythm: Normal rate and regular rhythm.     Heart sounds: No murmur. No friction rub. No gallop.   Pulmonary:     Effort: Pulmonary effort is normal.     Breath sounds: Normal breath sounds. No wheezing or rales.  Musculoskeletal:     Right lower leg: No edema.     Left lower leg: No edema.  Skin:    General: Skin is warm and dry.  Neurological:     Mental Status: He is alert and oriented to person, place, and time.     No results found for this or any previous visit (from the past 24 hour(s)).  No results found.   ASSESSMENT and PLAN  1. Type 2 diabetes mellitus with microalbuminuria, without long-term current use of insulin (HCC) Uncontrolled. Increasing metformin. Cont prandin. Work on diet and weight loss.  - Hemoglobin A1c; Future  2. Essential hypertension Increasing losartan due to microalb. D/c amlodipine. Cont home BP monitoring. If above 130/85 then restart amlodipine. - Lipid panel; Future  3. Hyperlipidemia, unspecified hyperlipidemia type Checking labs today, medications will be adjusted as needed.  - Comprehensive metabolic panel; Future  Other orders - losartan (COZAAR) 100 MG tablet; Take 1 tablet (100 mg total) by mouth daily. - metFORMIN (GLUCOPHAGE-XR) 750 MG 24 hr tablet; Take 1 tablet (750 mg total) by mouth daily with breakfast.   Flu vaccine either at pharmacy of choice or here in clinic via nurse visit.   Return in about 3 months (around 12/08/2018).    Rutherford Guys, MD Primary Care at [facility] [place], [place] [address] Ph.  [phone] Fax [phone]

## 2018-09-08 NOTE — Patient Instructions (Addendum)
   1. Increasing losartan to 100mg  a day. Stop amlodipine. Continue checking your blood pressure at home. Goal is less than 130/85. If BP is above goal then resume amlodipine and let me know.  2. Increasing metformin to 750mg  a day. Please continue to work on portions and diet. Weight loss of at least 5lbs would help your diabetes get back in control.  3. [facility] at [facility] or here with nurse visit.  4. Fasting labs 3-4 days prior to our next appt in 3 months   If you have lab work done today you will be contacted with your lab results within the next 2 weeks.  If you have not heard from [place] then please contact us. The fastest way to get your results is to register for My Chart.   IF you received an x-ray today, you will receive an invoice from [facility]. Please contact [facility] at [phone] with questions or concerns regarding your invoice.   IF you received labwork today, you will receive an invoice from [facility]. Please contact [facility] at [phone] with questions or concerns regarding your invoice.   Our billing staff will not be able to assist you with questions regarding bills from these companies.  You will be contacted with the lab results as soon as they are available. The fastest way to get your results is to activate your My Chart account. Instructions are located on the last page of this paperwork. If you have not heard from [place] regarding the results in 2 weeks, please contact this office.

## 2018-09-11 ENCOUNTER — Ambulatory Visit: Payer: Federal, State, Local not specified - PPO | Admitting: Family Medicine

## 2018-10-20 ENCOUNTER — Telehealth: Payer: Self-pay | Admitting: Family Medicine

## 2018-10-20 NOTE — Telephone Encounter (Signed)
Pt's daughter called in to state that Jesse Baldwin is having some high bp. She said that Dr. Pamella Pert told him to stop taking his bp medicine since it was good for the last few visits. But now he is having high bp readings. His daughter did not remember to the exact readings. She said diastolic and systolic numbers were both over 100. The top number she said was around 180.

## 2018-10-29 ENCOUNTER — Other Ambulatory Visit: Payer: Self-pay

## 2018-10-29 DIAGNOSIS — I10 Essential (primary) hypertension: Secondary | ICD-10-CM

## 2018-10-29 MED ORDER — AMLODIPINE BESYLATE 5 MG PO TABS: 5.0000 mg | ORAL_TABLET | Freq: Every day | ORAL | 1 refills | Status: DC

## 2018-10-29 MED ORDER — AMLODIPINE BESYLATE 10 MG PO TABS: 10.0000 mg | ORAL_TABLET | Freq: Every day | ORAL | 1 refills | Status: DC

## 2018-10-29 NOTE — Telephone Encounter (Signed)
LVM to call office back about concerns,

## 2018-10-29 NOTE — Telephone Encounter (Signed)
Spoke to pt and informed his of providers recommendations to start taking the amlopidine 1 a day with his lisinopril to lower B/P, he verbalized understanding.

## 2018-11-22 ENCOUNTER — Other Ambulatory Visit: Payer: Self-pay | Admitting: Family Medicine

## 2018-12-01 ENCOUNTER — Other Ambulatory Visit: Payer: Self-pay

## 2018-12-01 ENCOUNTER — Ambulatory Visit (INDEPENDENT_AMBULATORY_CARE_PROVIDER_SITE_OTHER): Payer: Medicare Other | Admitting: Family Medicine

## 2018-12-01 DIAGNOSIS — I10 Essential (primary) hypertension: Secondary | ICD-10-CM | POA: Diagnosis not present

## 2018-12-01 DIAGNOSIS — E1129 Type 2 diabetes mellitus with other diabetic kidney complication: Secondary | ICD-10-CM

## 2018-12-01 DIAGNOSIS — E785 Hyperlipidemia, unspecified: Secondary | ICD-10-CM

## 2018-12-01 DIAGNOSIS — R809 Proteinuria, unspecified: Secondary | ICD-10-CM

## 2018-12-02 LAB — LIPID PANEL
Chol/HDL Ratio: 4.3 ratio (ref 0.0–5.0)
Cholesterol, Total: 168 mg/dL (ref 100–199)
HDL: 39 mg/dL — ABNORMAL LOW (ref >39)
LDL Chol Calc (NIH): 110 mg/dL — ABNORMAL HIGH (ref 0–99)
Triglycerides: 101 mg/dL (ref 0–149)
VLDL Cholesterol Cal: 19 mg/dL (ref 5–40)

## 2018-12-02 LAB — COMPREHENSIVE METABOLIC PANEL
ALT: 34 IU/L (ref 0–44)
AST: 28 IU/L (ref 0–40)
Albumin/Globulin Ratio: 1.4 (ref 1.2–2.2)
Albumin: 4.5 g/dL (ref 3.8–4.8)
Alkaline Phosphatase: 96 IU/L (ref 39–117)
BUN/Creatinine Ratio: 18 (ref 10–24)
BUN: 20 mg/dL (ref 8–27)
Bilirubin Total: 0.8 mg/dL (ref 0.0–1.2)
CO2: 22 mmol/L (ref 20–29)
Calcium: 9.6 mg/dL (ref 8.6–10.2)
Chloride: 103 mmol/L (ref 96–106)
Creatinine, Ser: 1.09 mg/dL (ref 0.76–1.27)
GFR calc Af Amer: 82 mL/min/{1.73_m2} (ref >59)
GFR calc non Af Amer: 71 mL/min/{1.73_m2} (ref >59)
Globulin, Total: 3.2 g/dL (ref 1.5–4.5)
Glucose: 140 mg/dL — ABNORMAL HIGH (ref 65–99)
Potassium: 4.2 mmol/L (ref 3.5–5.2)
Sodium: 141 mmol/L (ref 134–144)
Total Protein: 7.7 g/dL (ref 6.0–8.5)

## 2018-12-02 LAB — HEMOGLOBIN A1C
Est. average glucose Bld gHb Est-mCnc: 174 mg/dL
Hgb A1c MFr Bld: 7.7 % — ABNORMAL HIGH (ref 4.8–5.6)

## 2018-12-07 ENCOUNTER — Other Ambulatory Visit: Payer: Self-pay | Admitting: Family Medicine

## 2018-12-07 MED ORDER — METFORMIN HCL ER 750 MG PO TB24: 1500.0000 mg | ORAL_TABLET | Freq: Every day | ORAL | 1 refills | Status: DC

## 2018-12-08 ENCOUNTER — Other Ambulatory Visit: Payer: Self-pay

## 2018-12-08 ENCOUNTER — Encounter: Payer: Self-pay | Admitting: Family Medicine

## 2018-12-08 ENCOUNTER — Ambulatory Visit: Payer: Medicare Other | Admitting: Family Medicine

## 2018-12-08 VITALS — BP 136/80 | HR 79 | Temp 97.3°F | Ht 62.6 in | Wt 156.2 lb

## 2018-12-08 DIAGNOSIS — I10 Essential (primary) hypertension: Secondary | ICD-10-CM

## 2018-12-08 DIAGNOSIS — E785 Hyperlipidemia, unspecified: Secondary | ICD-10-CM

## 2018-12-08 DIAGNOSIS — R809 Proteinuria, unspecified: Secondary | ICD-10-CM | POA: Diagnosis not present

## 2018-12-08 DIAGNOSIS — E1129 Type 2 diabetes mellitus with other diabetic kidney complication: Secondary | ICD-10-CM

## 2018-12-08 MED ORDER — METFORMIN HCL ER 500 MG PO TB24: 1500.0000 mg | ORAL_TABLET | Freq: Every day | ORAL | Status: DC

## 2018-12-08 MED ORDER — METFORMIN HCL ER 500 MG PO TB24: 1000.0000 mg | ORAL_TABLET | Freq: Two times a day (BID) | ORAL | 1 refills | Status: DC

## 2018-12-08 NOTE — [place] Notes (Signed)
12/1/20208:21 AM  Jesse Baldwin 11-22-1955, 63 y.o., male [id number]  Chief Complaint  Patient presents with  . Diabetes  . Hypertension    HPI:   Patient is a 63 y.o. male with past medical history significant for DM2, HTN, HLPwho presents today for routine followup  Last OV Sept 2020 Increased metformin Increased losartan due to micro. D/c amlodipine  Checks cbgs every morning Today 122, usually at goal Takes metformin 1500mg  a day prandin 1mg  usually twice a day (breakfast and dinner), when he has tried to increase to TID but gets low cbgs, in the low 70s, got sweaty and shaky No polydipsia or polyuria No numbness or tingling in his feet No blurry vision, has an eye appt this month Tolerating metformin well  He increased losartan to 100mg  but continued with amlodipine 5mg  daily Checks BP at home, similar to today Denies any dizziness or lightheaded Denies CP, edema, palpitations, SOB, cough Declines any further pneumonia vaccines after an intense negative reaction after pneumovax 23 in 2017  Wt Readings from Last 3 Encounters:  12/08/18 156 lb 3.2 oz (70.9 kg)  09/08/18 156 lb 6.4 oz (70.9 kg)  03/13/18 151 lb 12.8 oz (68.9 kg)    Lab Results  Component Value Date   HGBA1C 7.7 (H) 12/01/2018   HGBA1C 7.7 (H) 09/02/2018   HGBA1C 7.4 (H) 06/08/2018   Lab Results  Component Value Date   MICROALBUR 23.4 11/06/2015   LDLCALC 110 (H) 12/01/2018   CREATININE 1.09 12/01/2018     Depression screen PHQ 2/9 09/08/2018 06/11/2018 03/13/2018  Decreased Interest 0 0 0  Down, Depressed, Hopeless 0 0 0  PHQ - 2 Score 0 0 0    Fall Risk  12/08/2018 09/08/2018 06/11/2018 03/13/2018 12/12/2017  Falls in the past year? 0 0 0 0 0  Number falls in past yr: 0 0 0 0 -  Injury with Fall? 0 0 0 0 -     Allergies  Allergen Reactions  . Septra [Bactrim] Nausea And Vomiting  . Sulfa Antibiotics Rash    Prior to Admission medications   Medication Sig Start Date End Date Taking?  Authorizing Provider  amLODipine (NORVASC) 5 MG tablet Take 5 mg by mouth daily. 10/29/18  Yes [provider]  latanoprost (XALATAN) 0.005 % ophthalmic solution 1 drop at bedtime.   Yes [provider]  losartan (COZAAR) 100 MG tablet Take 1 tablet (100 mg total) by mouth daily. 09/08/18  Yes Rutherford Guys, MD  metFORMIN (GLUCOPHAGE-XR) 500 MG 24 hr tablet Take 1,000 mg by mouth 2 (two) times daily. 12/02/18  Yes [provider]  repaglinide (PRANDIN) 1 MG tablet TAKE 1 TABLET (1 MG TOTAL) BY MOUTH 3 (THREE) TIMES DAILY BEFORE MEALS. 09/03/18  Yes Rutherford Guys, MD  rosuvastatin (CRESTOR) 20 MG tablet Take 1 tablet (20 mg total) by mouth daily. 03/13/18  Yes Rutherford Guys, MD    Past Medical History:  Diagnosis Date  . Diabetes mellitus without complication ([place])   . Hyperlipidemia   . Hypertension     Past Surgical History:  Procedure Laterality Date  . CARPAL TUNNEL RELEASE Bilateral   . CATARACT EXTRACTION Right   . CHOLECYSTECTOMY  1990  . CYST REMOVAL NECK      Social History   Tobacco Use  . Smoking status: Former Smoker    Quit date: 04/07/1981    Years since quitting: 37.6  . Smokeless tobacco: Never Used  Substance Use Topics  .  Alcohol use: No    Family History  Problem Relation Age of Onset  . Diabetes Mother   . Hypertension Mother   . Diabetes Father   . Hypertension Father   . Diabetes Sister   . Hypertension Sister   . Cancer Sister 50       bone marrow cancer  . Diabetes Brother   . Hypertension Brother   . Colon cancer Neg Hx     ROS Per hpi  OBJECTIVE:  Today's Vitals   12/08/18 0815  BP: 136/80  Pulse: 79  Temp: (!) 97.3 F (36.3 C)  SpO2: 96%  Weight: 156 lb 3.2 oz (70.9 kg)  Height: 5' 2.6" (1.59 m)   Body mass index is 28.02 kg/m.   Physical Exam Vitals signs and nursing note reviewed.  Constitutional:      Appearance: He is well-developed.  HENT:     Head: Normocephalic and atraumatic.   Eyes:     Conjunctiva/sclera: Conjunctivae normal.     Pupils: Pupils are equal, round, and reactive to light.  Neck:     Musculoskeletal: Neck supple.  Cardiovascular:     Rate and Rhythm: Normal rate and regular rhythm.     Heart sounds: No murmur. No friction rub. No gallop.   Pulmonary:     Effort: Pulmonary effort is normal.     Breath sounds: Normal breath sounds. No wheezing or rales.  Skin:    General: Skin is warm and dry.  Neurological:     Mental Status: He is alert and oriented to person, place, and time.     No results found for this or any previous visit (from the past 24 hour(s)).  No results found.   ASSESSMENT and PLAN  1. Type 2 diabetes mellitus with microalbuminuria, without long-term current use of insulin (HCC) Uncontrolled. Increasing metformin to 1000mg  BID, on max dose of ARB. Discussed importance of LFM, consider changing prandin to GLP1. - Hemoglobin A1c; Future - Microalbumin / creatinine urine ratio; Future  2. Essential hypertension Controlled. Continue current regime.  - Comprehensive metabolic panel; Future  3. Hyperlipidemia, unspecified hyperlipidemia type On high dose statin. Stable. - Lipid panel; Future  Other orders - amLODipine (NORVASC) 5 MG tablet; Take 5 mg by mouth daily. - metFORMIN (GLUCOPHAGE-XR) 500 MG 24 hr tablet; Take 2 tablets (1,000 mg total) by mouth 2 (two) times daily with a meal.  Return in about 3 months (around 03/08/2019) for fasting labs 3-4 days prior.    Rutherford Guys, MD Primary Care at [facility] [place], [place] [address] Ph.  [phone] Fax [phone]

## 2018-12-08 NOTE — Patient Instructions (Addendum)
   If you have lab work done today you will be contacted with your lab results within the next 2 weeks.  If you have not heard from us then please contact us. The fastest way to get your results is to register for My Chart.   IF you received an x-ray today, you will receive an invoice from [facility]. Please contact [facility] at [phone] with questions or concerns regarding your invoice.   IF you received labwork today, you will receive an invoice from [facility]. Please contact [facility] at [phone] with questions or concerns regarding your invoice.   Our billing staff will not be able to assist you with questions regarding bills from these companies.  You will be contacted with the lab results as soon as they are available. The fastest way to get your results is to activate your My Chart account. Instructions are located on the last page of this paperwork. If you have not heard from us regarding the results in 2 weeks, please contact this office.     Diabetes Mellitus and Nutrition, Adult When you have diabetes (diabetes mellitus), it is very important to have healthy eating habits because your blood sugar (glucose) levels are greatly affected by what you eat and drink. Eating healthy foods in the appropriate amounts, at about the same times every day, can help you:  Control your blood glucose.  Lower your risk of heart disease.  Improve your blood pressure.  Reach or maintain a healthy weight. Every person with diabetes is different, and each person has different needs for a meal plan. Your health care provider may recommend that you work with a diet and nutrition specialist (dietitian) to make a meal plan that is best for you. Your meal plan may vary depending on factors such as:  The calories you need.  The medicines you take.  Your weight.  Your blood glucose, blood pressure, and cholesterol levels.  Your activity level.  Other health conditions  you have, such as heart or kidney disease. How do carbohydrates affect me? Carbohydrates, also called carbs, affect your blood glucose level more than any other type of food. Eating carbs naturally raises the amount of glucose in your blood. Carb counting is a method for keeping track of how many carbs you eat. Counting carbs is important to keep your blood glucose at a healthy level, especially if you use insulin or take certain oral diabetes medicines. It is important to know how many carbs you can safely have in each meal. This is different for every person. Your dietitian can help you calculate how many carbs you should have at each meal and for each snack. Foods that contain carbs include:  Bread, cereal, rice, pasta, and crackers.  Potatoes and corn.  Peas, beans, and lentils.  Milk and yogurt.  Fruit and juice.  Desserts, such as cakes, cookies, ice cream, and candy. How does alcohol affect me? Alcohol can cause a sudden decrease in blood glucose (hypoglycemia), especially if you use insulin or take certain oral diabetes medicines. Hypoglycemia can be a life-threatening condition. Symptoms of hypoglycemia (sleepiness, dizziness, and confusion) are similar to symptoms of having too much alcohol. If your health care provider says that alcohol is safe for you, follow these guidelines:  Limit alcohol intake to no more than 1 drink per day for nonpregnant women and 2 drinks per day for men. One drink equals 12 oz of beer, 5 oz of wine, or 1 oz of hard liquor.    Do not drink on an empty stomach.  Keep yourself hydrated with water, diet soda, or unsweetened iced tea.  Keep in mind that regular soda, juice, and other mixers may contain a lot of sugar and must be counted as carbs. What are tips for following this plan?  Reading food labels  Start by checking the serving size on the "Nutrition Facts" label of packaged foods and drinks. The amount of calories, carbs, fats, and other  nutrients listed on the label is based on one serving of the item. Many items contain more than one serving per package.  Check the total grams (g) of carbs in one serving. You can calculate the number of servings of carbs in one serving by dividing the total carbs by 15. For example, if a food has 30 g of total carbs, it would be equal to 2 servings of carbs.  Check the number of grams (g) of saturated and trans fats in one serving. Choose foods that have low or no amount of these fats.  Check the number of milligrams (mg) of salt (sodium) in one serving. Most people should limit total sodium intake to less than 2,300 mg per day.  Always check the nutrition information of foods labeled as "low-fat" or "nonfat". These foods may be higher in added sugar or refined carbs and should be avoided.  Talk to your dietitian to identify your daily goals for nutrients listed on the label. Shopping  Avoid buying canned, premade, or processed foods. These foods tend to be high in fat, sodium, and added sugar.  Shop around the outside edge of the grocery store. This includes fresh fruits and vegetables, bulk grains, fresh meats, and fresh dairy. Cooking  Use low-heat cooking methods, such as baking, instead of high-heat cooking methods like deep frying.  Cook using healthy oils, such as olive, canola, or sunflower oil.  Avoid cooking with butter, cream, or high-fat meats. Meal planning  Eat meals and snacks regularly, preferably at the same times every day. Avoid going long periods of time without eating.  Eat foods high in fiber, such as fresh fruits, vegetables, beans, and whole grains. Talk to your dietitian about how many servings of carbs you can eat at each meal.  Eat 4-6 ounces (oz) of lean protein each day, such as lean meat, chicken, fish, eggs, or tofu. One oz of lean protein is equal to: ? 1 oz of meat, chicken, or fish. ? 1 egg. ?  cup of tofu.  Eat some foods each day that contain  healthy fats, such as avocado, nuts, seeds, and fish. Lifestyle  Check your blood glucose regularly.  Exercise regularly as told by your health care provider. This may include: ? 150 minutes of moderate-intensity or vigorous-intensity exercise each week. This could be brisk walking, biking, or water aerobics. ? Stretching and doing strength exercises, such as yoga or weightlifting, at least 2 times a week.  Take medicines as told by your health care provider.  Do not use any products that contain nicotine or tobacco, such as cigarettes and e-cigarettes. If you need help quitting, ask your health care provider.  Work with a counselor or diabetes educator to identify strategies to manage stress and any emotional and social challenges. Questions to ask a health care provider  Do I need to meet with a diabetes educator?  Do I need to meet with a dietitian?  What number can I call if I have questions?  When are the best times to   check my blood glucose? Where to find more information:  American Diabetes Association: diabetes.org  Academy of Nutrition and Dietetics: www.eatright.org  National Institute of Diabetes and Digestive and Kidney Diseases (NIH): www.niddk.nih.gov Summary  A healthy meal plan will help you control your blood glucose and maintain a healthy lifestyle.  Working with a diet and nutrition specialist (dietitian) can help you make a meal plan that is best for you.  Keep in mind that carbohydrates (carbs) and alcohol have immediate effects on your blood glucose levels. It is important to count carbs and to use alcohol carefully. This information is not intended to replace advice given to you by your health care provider. Make sure you discuss any questions you have with your health care provider. Document Released: 09/20/2004 Document Revised: 12/06/2016 Document Reviewed: 01/29/2016 Elsevier Patient Education  2020 Elsevier Inc.  

## 2019-01-29 DIAGNOSIS — Z03818 Encounter for observation for suspected exposure to other biological agents ruled out: Secondary | ICD-10-CM | POA: Diagnosis not present

## 2019-01-29 DIAGNOSIS — U071 COVID-19: Secondary | ICD-10-CM | POA: Diagnosis not present

## 2019-02-02 ENCOUNTER — Other Ambulatory Visit: Payer: Self-pay

## 2019-02-02 ENCOUNTER — Telehealth (INDEPENDENT_AMBULATORY_CARE_PROVIDER_SITE_OTHER): Payer: Medicare Other | Admitting: Registered Nurse

## 2019-02-02 VITALS — Temp 99.0°F | Wt 131.0 lb

## 2019-02-02 DIAGNOSIS — U071 COVID-19: Secondary | ICD-10-CM | POA: Diagnosis not present

## 2019-02-02 MED ORDER — PROMETHAZINE-CODEINE 6.25-10 MG/5ML PO SOLN: 5.0000 mL | Freq: Every day | ORAL | 0 refills | Status: DC

## 2019-02-02 MED ORDER — GUAIFENESIN-DM 100-10 MG/5ML PO SYRP: 5.0000 mL | ORAL_SOLUTION | ORAL | 1 refills | Status: DC | PRN

## 2019-02-02 MED ORDER — BENZONATATE 100 MG PO CAPS: 100.0000 mg | ORAL_CAPSULE | Freq: Two times a day (BID) | ORAL | 1 refills | Status: DC | PRN

## 2019-02-02 NOTE — Progress Notes (Signed)
Patient states that last Friday he got tested for COVID , and the test was positive. Per patient he is experiencing some coughing , Low grade fever, and short of breath. He is only using tylenol at this time because of the fever being 99.0 or a little higher. Patient would like to know if there is anything he could get for the cough.

## 2019-02-03 ENCOUNTER — Encounter: Payer: Self-pay | Admitting: Family Medicine

## 2019-02-03 ENCOUNTER — Telehealth (INDEPENDENT_AMBULATORY_CARE_PROVIDER_SITE_OTHER): Payer: Medicare Other | Admitting: Family Medicine

## 2019-02-03 ENCOUNTER — Ambulatory Visit (INDEPENDENT_AMBULATORY_CARE_PROVIDER_SITE_OTHER): Payer: Medicare Other | Admitting: Family Medicine

## 2019-02-03 ENCOUNTER — Other Ambulatory Visit: Payer: Self-pay

## 2019-02-03 VITALS — BP 110/70 | HR 105 | Temp 100.7°F | Ht 62.0 in | Wt 154.1 lb

## 2019-02-03 VITALS — BP 130/80 | Ht 62.0 in | Wt 152.0 lb

## 2019-02-03 DIAGNOSIS — R509 Fever, unspecified: Secondary | ICD-10-CM | POA: Diagnosis not present

## 2019-02-03 DIAGNOSIS — J9811 Atelectasis: Secondary | ICD-10-CM | POA: Diagnosis not present

## 2019-02-03 DIAGNOSIS — U071 COVID-19: Secondary | ICD-10-CM | POA: Diagnosis not present

## 2019-02-03 DIAGNOSIS — R059 Cough, unspecified: Secondary | ICD-10-CM

## 2019-02-03 DIAGNOSIS — J40 Bronchitis, not specified as acute or chronic: Secondary | ICD-10-CM | POA: Diagnosis not present

## 2019-02-03 DIAGNOSIS — R05 Cough: Secondary | ICD-10-CM

## 2019-02-03 MED ORDER — ALBUTEROL SULFATE HFA 108 (90 BASE) MCG/ACT IN AERS: 2.0000 | INHALATION_SPRAY | RESPIRATORY_TRACT | Status: AC | PRN

## 2019-02-03 MED ORDER — AMOXICILLIN-POT CLAVULANATE 875-125 MG PO TABS: 1.0000 | ORAL_TABLET | Freq: Two times a day (BID) | ORAL | 0 refills | Status: DC

## 2019-02-03 NOTE — Progress Notes (Signed)
Patient ID: Jesse Baldwin, male    DOB: 05/22/1955, 64 y.o.   MRN: [id number]  PCP: Rutherford Guys, MD  Chief Complaint  Patient presents with  . COVID 19 INFECTION    Subjective:  HPI Jesse Baldwin is a 64 y.o. male presents to [facility] for evaluation of symptoms related to COVID-19 infection. Diagnosed with COVID-19 on 01/29/19, following two days of symptoms of cough and fever. He is a non- smoker. He is experiencing shortness of breath with mild exertional activity and a persistent cough. Cough is non-productive. No history of respiratory disease. He has retained his sense of taste and smell. He is eating and drinking at his baseline. Blood sugars have remained stable during infection. He is febrile today and has not taken tylenol since yesterday. He is free of chest pain, new weakness, or dizziness. He has been prescribed benzonatate and Robitussin DM only for cough which has mildly improved frequency and intensity of cough.  High Risk for [id number] complications include: Type 2 diabetes and Hypertension   Review of Systems Pertinent negatives listed in HPI  Patient Active Problem List   Diagnosis Date Noted  . Type 2 diabetes mellitus with microalbuminuria, without long-term current use of insulin ([place]) 01/09/2015  . Hypertension 04/08/2011  . Hyperlipidemia 04/08/2011  . Glaucoma 04/08/2011  . Hearing loss in right ear 04/08/2011      Prior to Admission medications   Medication Sig Start Date End Date Taking? Authorizing Provider  amLODipine (NORVASC) 5 MG tablet Take 5 mg by mouth daily. 10/29/18  Yes [provider]  benzonatate (TESSALON) 100 MG capsule Take 1 capsule (100 mg total) by mouth 2 (two) times daily as needed for cough. 02/02/19  Yes Maximiano Coss, NP  guaiFENesin-dextromethorphan (ROBITUSSIN DM) 100-10 MG/5ML syrup Take 5 mLs by mouth every 4 (four) hours as needed for cough. 02/02/19  Yes Maximiano Coss, NP  latanoprost (XALATAN)  0.005 % ophthalmic solution 1 drop at bedtime.   Yes [provider]  losartan (COZAAR) 100 MG tablet Take 1 tablet (100 mg total) by mouth daily. 09/08/18  Yes Rutherford Guys, MD  metFORMIN (GLUCOPHAGE-XR) 500 MG 24 hr tablet Take 2 tablets (1,000 mg total) by mouth 2 (two) times daily with a meal. 12/08/18  Yes Rutherford Guys, MD  Promethazine-Codeine 6.25-10 MG/5ML SOLN Take 5 mLs by mouth at bedtime. 02/02/19  Yes Maximiano Coss, NP  repaglinide (PRANDIN) 1 MG tablet TAKE 1 TABLET (1 MG TOTAL) BY MOUTH 3 (THREE) TIMES DAILY BEFORE MEALS. 09/03/18  Yes Rutherford Guys, MD  rosuvastatin (CRESTOR) 20 MG tablet Take 1 tablet (20 mg total) by mouth daily. 03/13/18  Yes Rutherford Guys, MD    Past Medical, Surgical Family and Social History reviewed and updated.    Objective:   Today's Vitals   02/03/19 1839  BP: 110/70  Pulse: (!) 105  Temp: (!) 100.7 F (38.2 C)  SpO2: 96%  Weight: 154 lb 1.3 oz (69.9 kg)  Height: 5\' 2"  (1.575 m)    Wt Readings from Last 3 Encounters:  02/03/19 154 lb 1.3 oz (69.9 kg)  02/03/19 152 lb (68.9 kg)  02/02/19 131 lb (59.4 kg)     Physical Exam Constitutional:      General: He is not in acute distress.    Appearance: He is normal weight. He is not ill-appearing or toxic-appearing.  HENT:     Head: Normocephalic.     Nose: Nose normal.  Mouth/Throat:     Mouth: Mucous membranes are dry.  Cardiovascular:     Rate and Rhythm: Regular rhythm. Tachycardia present.  Pulmonary:     Breath sounds: Decreased air movement present. No rhonchi.  Abdominal:     General: Bowel sounds are normal.     Palpations: Abdomen is soft.  Musculoskeletal:        General: Normal range of motion.     Cervical back: Normal range of motion.  Skin:    General: Skin is warm.  Neurological:     Mental Status: He is alert and oriented to person, place, and time.  Psychiatric:        Mood and Affect: Mood normal.    Assessment & Plan:  1. Bronchitis  due to COVID-19 virus -My Chart COVID monitoring ordered. - DG Chest 2 View; Future, pending - albuterol (VENTOLIN HFA) 108 (90 Base) MCG/ACT inhaler 2 puff with chamber -Augmentin 1 tablet BI/d x 10 days   2. Cough -Continue antitussive medication prescribed by PCP - DG Chest 2 View; Future - albuterol (VENTOLIN HFA) 108 (90 Base) MCG/ACT inhaler 2 puff  3. Fever, unspecified fever cause - DG Chest 2 View; Future - Temperature monitoring; Future  4. Atelectasis, left lung -Per chest x-ray 02/04/19, recommended deep breathing and use of inhaler. If SOB worsens, follow-up with PCP for further evaluation.   DG Chest 2 View  Result Date: 02/04/2019 CLINICAL DATA:  Cough and shortness of breath.  COVID-19 positive EXAM: CHEST - 2 VIEW COMPARISON:  None. FINDINGS: There is atelectatic change in the left mid lung. Lungs elsewhere clear. Heart size and pulmonary vascularity are normal. No adenopathy. There is aortic atherosclerosis. There is degenerative change in the thoracic spine. IMPRESSION: Atelectatic change left mid lung.  Lungs elsewhere clear. No adenopathy. Heart size normal. Aortic Atherosclerosis (ICD10-I70.0). Electronically Signed   By: Lowella Grip III M.D.   On: 02/04/2019 09:56       -The patient was given clear instructions to go to ER or return to medical center if symptoms do not improve, worsen or new problems develop. The patient verbalized understanding.     Molli Barrows, FNP-C [facility], PRN Provider  [facility]. [place], [place] [facility] Phone: [phone] Clinic Fax: [phone] Clinic Hours: 5:30 pm -7:30 pm (Monday-Friday)

## 2019-02-03 NOTE — Progress Notes (Signed)
Telemedicine Encounter- SOAP NOTE Established Patient  This telephone encounter was conducted with the patient's (or proxy's) verbal consent via audio telecommunications: yes  Patient was instructed to have this encounter in a suitably private space; and to only have persons present to whom they give permission to participate. In addition, patient identity was confirmed by use of name plus two identifiers (DOB and address).  I discussed the limitations, risks, security and privacy concerns of performing an evaluation and management service by telephone and the availability of in person appointments. I also discussed with the patient that there may be a patient responsible charge related to this service. The patient expressed understanding and agreed to proceed.  I spent a total of 13 minutes talking with the patient or their proxy.  No chief complaint on file.   Subjective   Jesse Baldwin is a 64 y.o. established patient. Telephone visit today for COVID-19 infection  HPI Pt reports onset of symptoms was about 1 week ago. Tested Friday, positive Current symptoms include coughing, fever, fatigue, and some mild shob.  He has been using tylenol with some effect.  Cough has been bothersome for him - interrupting sleep. Not exacerbating shob at this time  Denies changes to taste or smell, headache, doe, chest pain, LOC, NVD, confusion, visual changes  Hx of T2DM - reports recent sugars have been low 130s.  Patient Active Problem List   Diagnosis Date Noted  . Type 2 diabetes mellitus with microalbuminuria, without long-term current use of insulin ([place]) 01/09/2015  . Hypertension 04/08/2011  . Hyperlipidemia 04/08/2011  . Glaucoma 04/08/2011  . Hearing loss in right ear 04/08/2011    Past Medical History:  Diagnosis Date  . Diabetes mellitus without complication ([place])   . Hyperlipidemia   . Hypertension     Current Outpatient Medications  Medication Sig Dispense Refill  .  amLODipine (NORVASC) 5 MG tablet Take 5 mg by mouth daily.    Marland Kitchen latanoprost (XALATAN) 0.005 % ophthalmic solution 1 drop at bedtime.    Marland Kitchen losartan (COZAAR) 100 MG tablet Take 1 tablet (100 mg total) by mouth daily. 90 tablet 1  . metFORMIN (GLUCOPHAGE-XR) 500 MG 24 hr tablet Take 2 tablets (1,000 mg total) by mouth 2 (two) times daily with a meal. 360 tablet 1  . repaglinide (PRANDIN) 1 MG tablet TAKE 1 TABLET (1 MG TOTAL) BY MOUTH 3 (THREE) TIMES DAILY BEFORE MEALS. 270 tablet 1  . rosuvastatin (CRESTOR) 20 MG tablet Take 1 tablet (20 mg total) by mouth daily. 90 tablet 3  . benzonatate (TESSALON) 100 MG capsule Take 1 capsule (100 mg total) by mouth 2 (two) times daily as needed for cough. 20 capsule 1  . guaiFENesin-dextromethorphan (ROBITUSSIN DM) 100-10 MG/5ML syrup Take 5 mLs by mouth every 4 (four) hours as needed for cough. 118 mL 1  . Promethazine-Codeine 6.25-10 MG/5ML SOLN Take 5 mLs by mouth at bedtime. 280 mL 0   No current facility-administered medications for this visit.    Allergies  Allergen Reactions  . Septra [Bactrim] Nausea And Vomiting  . Sulfa Antibiotics Rash    Social History   Socioeconomic History  . Marital status: Married    Spouse name: Not on file  . Number of children: Not on file  . Years of education: Not on file  . Highest education level: Not on file  Occupational History  . Not on file  Tobacco Use  . Smoking status: Former Smoker    Quit date:  04/07/1981    Years since quitting: 37.8  . Smokeless tobacco: Never Used  Substance and Sexual Activity  . Alcohol use: No  . Drug use: No  . Sexual activity: Yes    Birth control/protection: None  Other Topics Concern  . 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      Alcohol: quit drinking 1998.      Drugs: none      Exercise: 3 days per week; walking.  Light weights.   Social Determinants of Health   Financial Resource Strain:   . Difficulty of Paying Living Expenses: Not on file  Food Insecurity:   . Worried About Charity fundraiser in the Last Year: Not on file  . Ran Out of Food in the Last Year: Not on file  Transportation Needs:   . Lack of Transportation (Medical): Not on file  . Lack of Transportation (Non-Medical): Not on file  Physical Activity:   . Days of Exercise per Week: Not on file  . Minutes of Exercise per Session: Not on file  Stress:   . Feeling of Stress : Not on file  Social Connections:   . Frequency of Communication with Friends and Family: Not on file  . Frequency of Social Gatherings with Friends and Family: Not on file  . Attends Religious Services: Not on file  . Active Member of Clubs or Organizations: Not on file  . Attends Archivist Meetings: Not on file  . Marital Status: Not on file  Intimate Partner Violence:   . Fear of Current or Ex-Partner: Not on file  . Emotionally Abused: Not on file  . Physically Abused: Not on file  . Sexually Abused: Not on file    Review of Systems  Constitutional: Positive for chills, fever and malaise/fatigue. Negative for diaphoresis and weight loss.  HENT: Negative.   Eyes: Negative.   Respiratory: Positive for cough, sputum production and shortness of breath. Negative for hemoptysis and wheezing.   Cardiovascular: Negative.   Gastrointestinal: Negative.  Negative for diarrhea, nausea and vomiting.  Genitourinary: Negative.   Musculoskeletal: Negative.  Negative for myalgias.  Skin: Negative.   Neurological: Negative.   Endo/Heme/Allergies: Negative.   Psychiatric/Behavioral: Negative.   All other systems reviewed and are negative.   Objective   Vitals as reported by the patient: Today's Vitals   02/02/19 1343  Temp: 99 F (37.2  C)  Weight: 131 lb (59.4 kg)    Diagnoses and all orders for this visit:  COVID-19 -     guaiFENesin-dextromethorphan (ROBITUSSIN DM) 100-10 MG/5ML syrup; Take 5 mLs by mouth every 4 (four) hours as needed for cough. -     Promethazine-Codeine 6.25-10 MG/5ML SOLN; Take 5 mLs by mouth at bedtime. -     benzonatate (TESSALON) 100 MG capsule; Take 1 capsule (100 mg total) by mouth 2 (two) times daily as needed for cough.   PLAN  Robitussin DM for daytime  Promethazine-Codeine for sleep  Tessalon for intermittent use   Suggest continue tylenol up to 2.5g / day as needed  Rest, hydration, supportive care discussed.  Patient encouraged to call clinic with  any questions, comments, or concerns.   I discussed the assessment and treatment plan with the patient. The patient was provided an opportunity to ask questions and all were answered. The patient agreed with the plan and demonstrated an understanding of the instructions.   The patient was advised to call back or seek an in-person evaluation if the symptoms worsen or if the condition fails to improve as anticipated.  I provided 13 minutes of non-face-to-face time during this encounter.  Maximiano Coss, NP  Primary Care at [facility]

## 2019-02-03 NOTE — Progress Notes (Signed)
Virtual Visit via Telephone Note  I connected with Jesse Baldwin on 02/03/19 at 12:46 PM by telephone and verified that I am speaking with the correct person using two identifiers.   I discussed the limitations, risks, security and privacy concerns of performing an evaluation and management service by telephone and the availability of in person appointments. I also discussed with the patient that there may be a patient responsible charge related to this service. The patient expressed understanding and agreed to proceed, consent obtained  Chief complaint: Covid 19:   Chief Complaint  Patient presents with  . Cough    per pt started last Wednesday with productive yellow mucus and tested positive COVID last Friday,   . Fever    99.9 degrees yesterday and this morning, chills a little bit and headache     History of Present Illness: Jesse Baldwin is a 64 y.o. male  Covid 66 infection: Started with productive cough 1 week ago. Had positive test 5 days ago. Still with fever - 99.9 today and some chills. Feels like getting worse - more cough, and concerned that fever returned after improved initially. Highest 99.9. few episodes of shortenss of breath with activity. Had felt stable, now feels worse - more cough - productive of yellow phlegm.  Drinking fluids - drinking water. No dizziness/near syncope.   Had telemed visit yesterday, rx tessalon, dextromethorphan, and phenergan with codeine. No relief of cough.   History of HTN, DM, covid risk of complication score of 4.  Patient Active Problem List   Diagnosis Date Noted  . Type 2 diabetes mellitus with microalbuminuria, without long-term current use of insulin ([facility]) 01/09/2015  . Hypertension 04/08/2011  . Hyperlipidemia 04/08/2011  . Glaucoma 04/08/2011  . Hearing loss in right ear 04/08/2011   Past Medical History:  Diagnosis Date  . Diabetes mellitus without complication ([place])   . Hyperlipidemia   . Hypertension    Past  Surgical History:  Procedure Laterality Date  . CARPAL TUNNEL RELEASE Bilateral   . CATARACT EXTRACTION Right   . CHOLECYSTECTOMY  1990  . CYST REMOVAL NECK     Allergies  Allergen Reactions  . Septra [Bactrim] Nausea And Vomiting  . Sulfa Antibiotics Rash   Prior to Admission medications   Medication Sig Start Date End Date Taking? Authorizing Provider  amLODipine (NORVASC) 5 MG tablet Take 5 mg by mouth daily. 10/29/18  Yes [provider]  benzonatate (TESSALON) 100 MG capsule Take 1 capsule (100 mg total) by mouth 2 (two) times daily as needed for cough. 02/02/19  Yes Maximiano Coss, NP  guaiFENesin-dextromethorphan (ROBITUSSIN DM) 100-10 MG/5ML syrup Take 5 mLs by mouth every 4 (four) hours as needed for cough. 02/02/19  Yes Maximiano Coss, NP  latanoprost (XALATAN) 0.005 % ophthalmic solution 1 drop at bedtime.   Yes [provider]  losartan (COZAAR) 100 MG tablet Take 1 tablet (100 mg total) by mouth daily. 09/08/18  Yes Rutherford Guys, MD  metFORMIN (GLUCOPHAGE-XR) 500 MG 24 hr tablet Take 2 tablets (1,000 mg total) by mouth 2 (two) times daily with a meal. 12/08/18  Yes Rutherford Guys, MD  Promethazine-Codeine 6.25-10 MG/5ML SOLN Take 5 mLs by mouth at bedtime. 02/02/19  Yes Maximiano Coss, NP  repaglinide (PRANDIN) 1 MG tablet TAKE 1 TABLET (1 MG TOTAL) BY MOUTH 3 (THREE) TIMES DAILY BEFORE MEALS. 09/03/18  Yes Rutherford Guys, MD  rosuvastatin (CRESTOR) 20 MG tablet Take 1 tablet (20 mg total) by mouth daily.  03/13/18  Yes Rutherford Guys, MD   Social History   Socioeconomic History  . Marital status: Married    Spouse name: Not on file  . Number of children: Not on file  . Years of education: Not on file  . Highest education level: Not on file  Occupational History  . Not on file  Tobacco Use  . Smoking status: Former Smoker    Quit date: 04/07/1981    Years since quitting: 37.8  . Smokeless tobacco: Never Used  Substance and Sexual Activity  .  Alcohol use: No  . Drug use: No  . Sexual activity: Yes    Birth control/protection: None  Other Topics Concern  . 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      Alcohol: quit drinking 1998.      Drugs: none      Exercise: 3 days per week; walking.  Light weights.   Social Determinants of Health   Financial Resource Strain:   . Difficulty of Paying Living Expenses: Not on file  Food Insecurity:   . Worried About Charity fundraiser in the Last Year: Not on file  . Ran Out of Food in the Last Year: Not on file  Transportation Needs:   . Lack of Transportation (Medical): Not on file  . Lack of Transportation (Non-Medical): Not on file  Physical Activity:   . Days of Exercise per Week: Not on file  . Minutes of Exercise per Session: Not on file  Stress:   . Feeling of Stress : Not on file  Social Connections:   . Frequency of Communication with Friends and Family: Not on file  . Frequency of Social Gatherings with Friends and Family: Not on file  . Attends Religious Services: Not on file  . Active Member of Clubs or Organizations: Not on file  . Attends Archivist Meetings: Not on file  . Marital Status: Not on file  Intimate Partner Violence:   . Fear of Current or Ex-Partner: Not on file  . Emotionally Abused: Not on file  . Physically Abused: Not on file  . Sexually Abused: Not on file     Observations/Objective: Vitals:   02/03/19 1118  BP: 130/80  Weight: 152 lb (68.9 kg)  Height: 5\' 2"  (1.575 m)  unable to check home O2 sat.   Nontoxic-appearing, slight cough at times during video visit.  Appropriate responses and does not appear to be in respiratory distress.  Speaking in full sentences.  Assessment  and Plan: COVID-19  Cough  Now 1 week into symptoms, did report some initial improvement of fevers then worsening, reported fever currently is low-grade, not truly at fever at home readings but symptomatically feels worse.  Cough feels worse, without relief from medications prescribed yesterday.  Differential includes pneumonia with COVID-19.  Based on video exam does not appear to be unstable or need urgent care/ER evaluation this time, so will initially have him assessed through respiratory clinic tonight.  ER precautions were given.  Symptomatic care with fluids, cough meds from yesterday for now.  Understanding expressed of plan.    Follow Up Instructions:  Respiratory clinic tonight.    I discussed the assessment and treatment plan with the patient. The patient was provided an opportunity to ask questions and all were answered. The patient agreed with the plan and demonstrated an understanding of the instructions.   The patient was advised to call back or seek an in-person evaluation if the symptoms worsen or if the condition fails to improve as anticipated.  I provided  11 minutes of non-face-to-face time during this encounter.  Signed,   Merri Ray, MD [facility].  02/03/19

## 2019-02-03 NOTE — Patient Instructions (Addendum)
Continue tylenol for management of fever. Albuterol inhaler 2 puffs every 4-6 hours as needed for shortness of breath 130 minutes prior to exertional activity to prevent shortness of breath. Augmentin twice daily for 10 days.  Chest x-ray 8:00-4:30 For x-ray imaging: [facility]  [address], [place], [place] [address] Enter through Main Entrance and request x-ray department. You will be contacted either via phone or via [facility] with your x-ray result.    COVID-19 COVID-19 is a respiratory infection that is caused by a virus called severe acute respiratory syndrome coronavirus 2 (SARS-CoV-2). The disease is also known as coronavirus disease or novel coronavirus. In some people, the virus may not cause any symptoms. In others, it may cause a serious infection. The infection can get worse quickly and can lead to complications, such as:  Pneumonia, or infection of the lungs.  Acute respiratory distress syndrome or ARDS. This is a condition in which fluid build-up in the lungs prevents the lungs from filling with air and passing oxygen into the blood.  Acute respiratory failure. This is a condition in which there is not enough oxygen passing from the lungs to the body or when carbon dioxide is not passing from the lungs out of the body.  Sepsis or septic shock. This is a serious bodily reaction to an infection.  Blood clotting problems.  Secondary infections due to bacteria or fungus.  Organ failure. This is when your body's organs stop working. The virus that causes COVID-19 is contagious. This means that it can spread from person to person through droplets from coughs and sneezes (respiratory secretions). What are the causes? This illness is caused by a virus. You may catch the virus by:  Breathing in droplets from an infected person. Droplets can be spread by a person breathing, speaking, singing, coughing, or sneezing.  Touching something, like a table or a doorknob,  that was exposed to the virus (contaminated) and then touching your mouth, nose, or eyes. What increases the risk? Risk for infection You are more likely to be infected with this virus if you:  Are within 6 feet (2 meters) of a person with COVID-19.  Provide care for or live with a person who is infected with COVID-19.  Spend time in crowded indoor spaces or live in shared housing. Risk for serious illness You are more likely to become seriously ill from the virus if you:  Are 13 years of age or older. The higher your age, the more you are at risk for serious illness.  Live in a nursing home or long-term care facility.  Have cancer.  Have a long-term (chronic) disease such as: ? Chronic lung disease, including chronic obstructive pulmonary disease or asthma. ? A long-term disease that lowers your body's ability to fight infection (immunocompromised). ? Heart disease, including heart failure, a condition in which the arteries that lead to the heart become narrow or blocked (coronary artery disease), a disease which makes the heart muscle thick, weak, or stiff (cardiomyopathy). ? Diabetes. ? Chronic kidney disease. ? Sickle cell disease, a condition in which red blood cells have an abnormal "sickle" shape. ? Liver disease.  Are obese. What are the signs or symptoms? Symptoms of this condition can range from mild to severe. Symptoms may appear any time from 2 to 14 days after being exposed to the virus. They include:  A fever or chills.  A cough.  Difficulty breathing.  Headaches, body aches, or muscle aches.  Runny or  stuffy (congested) nose.  A sore throat.  New loss of taste or smell. Some people may also have stomach problems, such as nausea, vomiting, or diarrhea. Other people may not have any symptoms of COVID-19. How is this diagnosed? This condition may be diagnosed based on:  Your signs and symptoms, especially if: ? You live in an area with a COVID-19  outbreak. ? You recently traveled to or from an area where the virus is common. ? You provide care for or live with a person who was diagnosed with COVID-19. ? You were exposed to a person who was diagnosed with COVID-19.  A physical exam.  Lab tests, which may include: ? Taking a sample of fluid from the back of your nose and throat (nasopharyngeal fluid), your nose, or your throat using a swab. ? A sample of mucus from your lungs (sputum). ? Blood tests.  Imaging tests, which may include, X-rays, CT scan, or ultrasound. How is this treated? At present, there is no medicine to treat COVID-19. Medicines that treat other diseases are being used on a trial basis to see if they are effective against COVID-19. Your health care provider will talk with you about ways to treat your symptoms. For most people, the infection is mild and can be managed at home with rest, fluids, and over-the-counter medicines. Treatment for a serious infection usually takes places in a hospital intensive care unit (ICU). It may include one or more of the following treatments. These treatments are given until your symptoms improve.  Receiving fluids and medicines through an IV.  Supplemental oxygen. Extra oxygen is given through a tube in the nose, a face mask, or a hood.  Positioning you to lie on your stomach (prone position). This makes it easier for oxygen to get into the lungs.  Continuous positive airway pressure (CPAP) or bi-level positive airway pressure (BPAP) machine. This treatment uses mild air pressure to keep the airways open. A tube that is connected to a motor delivers oxygen to the body.  Ventilator. This treatment moves air into and out of the lungs by using a tube that is placed in your windpipe.  Tracheostomy. This is a procedure to create a hole in the neck so that a breathing tube can be inserted.  Extracorporeal membrane oxygenation (ECMO). This procedure gives the lungs a chance to recover  by taking over the functions of the heart and lungs. It supplies oxygen to the body and removes carbon dioxide. Follow these instructions at home: Lifestyle  If you are sick, stay home except to get medical care. Your health care provider will tell you how long to stay home. Call your health care provider before you go for medical care.  Rest at home as told by your health care provider.  Do not use any products that contain nicotine or tobacco, such as cigarettes, e-cigarettes, and chewing tobacco. If you need help quitting, ask your health care provider.  Return to your normal activities as told by your health care provider. Ask your health care provider what activities are safe for you. General instructions  Take over-the-counter and prescription medicines only as told by your health care provider.  Drink enough fluid to keep your urine pale yellow.  Keep all follow-up visits as told by your health care provider. This is important. How is this prevented?  There is no vaccine to help prevent COVID-19 infection. However, there are steps you can take to protect yourself and others from this virus. To  protect yourself:   Do not travel to areas where COVID-19 is a risk. The areas where COVID-19 is reported change often. To identify high-risk areas and travel restrictions, check the CDC travel website: FatFares.com.br  If you live in, or must travel to, an area where COVID-19 is a risk, take precautions to avoid infection. ? Stay away from people who are sick. ? Wash your hands often with soap and water for 20 seconds. If soap and water are not available, use an alcohol-based hand sanitizer. ? Avoid touching your mouth, face, eyes, or nose. ? Avoid going out in public, follow guidance from your state and local health authorities. ? If you must go out in public, wear a cloth face covering or face mask. Make sure your mask covers your nose and mouth. ? Avoid crowded indoor  spaces. Stay at least 6 feet (2 meters) away from others. ? Disinfect objects and surfaces that are frequently touched every day. This may include:  Counters and tables.  Doorknobs and light switches.  Sinks and faucets.  Electronics, such as phones, remote controls, keyboards, computers, and tablets. To protect others: If you have symptoms of COVID-19, take steps to prevent the virus from spreading to others.  If you think you have a COVID-19 infection, contact your health care provider right away. Tell your health care team that you think you may have a COVID-19 infection.  Stay home. Leave your house only to seek medical care. Do not use public transport.  Do not travel while you are sick.  Wash your hands often with soap and water for 20 seconds. If soap and water are not available, use alcohol-based hand sanitizer.  Stay away from other members of your household. Let healthy household members care for children and pets, if possible. If you have to care for children or pets, wash your hands often and wear a mask. If possible, stay in your own room, separate from others. Use a different bathroom.  Make sure that all people in your household wash their hands well and often.  Cough or sneeze into a tissue or your sleeve or elbow. Do not cough or sneeze into your hand or into the air.  Wear a cloth face covering or face mask. Make sure your mask covers your nose and mouth. Where to find more information  Centers for Disease Control and Prevention: PurpleGadgets.be  World Health Organization: https://www.castaneda.info/ Contact a health care provider if:  You live in or have traveled to an area where COVID-19 is a risk and you have symptoms of the infection.  You have had contact with someone who has COVID-19 and you have symptoms of the infection. Get help right away if:  You have trouble breathing.  You have pain or pressure in your  chest.  You have confusion.  You have bluish lips and fingernails.  You have difficulty waking from sleep.  You have symptoms that get worse. These symptoms may represent a serious problem that is an emergency. Do not wait to see if the symptoms will go away. Get medical help right away. Call your local emergency services (911 in the [place] Do not drive yourself to the hospital. Let the emergency medical personnel know if you think you have COVID-19. Summary  COVID-19 is a respiratory infection that is caused by a virus. It is also known as coronavirus disease or novel coronavirus. It can cause serious infections, such as pneumonia, acute respiratory distress syndrome, acute respiratory failure, or sepsis.  The  virus that causes COVID-19 is contagious. This means that it can spread from person to person through droplets from breathing, speaking, singing, coughing, or sneezing.  You are more likely to develop a serious illness if you are 53 years of age or older, have a weak immune system, live in a nursing home, or have chronic disease.  There is no medicine to treat COVID-19. Your health care provider will talk with you about ways to treat your symptoms.  Take steps to protect yourself and others from infection. Wash your hands often and disinfect objects and surfaces that are frequently touched every day. Stay away from people who are sick and wear a mask if you are sick. This information is not intended to replace advice given to you by your health care provider. Make sure you discuss any questions you have with your health care provider. Document Revised: 10/23/2018 Document Reviewed: 01/29/2018 Elsevier Patient Education  [place].

## 2019-02-04 ENCOUNTER — Other Ambulatory Visit: Payer: Self-pay

## 2019-02-04 ENCOUNTER — Ambulatory Visit: Payer: Self-pay | Admitting: *Deleted

## 2019-02-04 ENCOUNTER — Ambulatory Visit (HOSPITAL_COMMUNITY)
Admission: RE | Admit: 2019-02-04 | Discharge: 2019-02-04 | Disposition: A | Discharge status: Home / Self Care | Payer: Medicare Other | Source: Ambulatory Visit | Attending: Family Medicine | Admitting: Family Medicine

## 2019-02-04 ENCOUNTER — Telehealth: Payer: Self-pay | Admitting: Nurse Practitioner

## 2019-02-04 ENCOUNTER — Telehealth: Payer: Self-pay | Admitting: Family Medicine

## 2019-02-04 DIAGNOSIS — R509 Fever, unspecified: Secondary | ICD-10-CM

## 2019-02-04 DIAGNOSIS — R059 Cough, unspecified: Secondary | ICD-10-CM

## 2019-02-04 DIAGNOSIS — U071 COVID-19: Secondary | ICD-10-CM | POA: Insufficient documentation

## 2019-02-04 DIAGNOSIS — R0602 Shortness of breath: Secondary | ICD-10-CM | POA: Diagnosis not present

## 2019-02-04 DIAGNOSIS — R05 Cough: Secondary | ICD-10-CM | POA: Insufficient documentation

## 2019-02-04 DIAGNOSIS — J40 Bronchitis, not specified as acute or chronic: Secondary | ICD-10-CM

## 2019-02-04 NOTE — Telephone Encounter (Signed)
Information given to infusion clinic. They have reached out to patient.

## 2019-02-04 NOTE — Telephone Encounter (Signed)
Pt wife called and stated that she has been waiting for Dr. Pamella Pert to call her in regards to the infusion but she has not received a call back. Pt wife requests call back at [phone]

## 2019-02-04 NOTE — Telephone Encounter (Signed)
  Additional Information . Commented on: Fever present > 3 days (72 hours)    Patient has COVID and has been seen at respiratory clinic. Wife is concerned that his O2 sat is too low and she wants to know if he qualifies for infusion clinic. Will send question to PCP- advised keep x-ry appointment today. Will send concerns to PCP for review  Protocols used: FEVER-A-AH

## 2019-02-04 NOTE — Telephone Encounter (Signed)
Please Advise on this. Pt was Dx with COVID on 01/29/2019 and has a fever that is getting worse. According from me looking back at some notes from [facility]/CMS she spoke with the pt's wife and advised them that if they have note heard back from no one for them to go over to the ER. She is wanting her husband to have an infusion.

## 2019-02-04 NOTE — Telephone Encounter (Signed)
L/m for infusion center to review pt for possible infusion.  Spoke with wife.  Advised that message was left to see if he is a candidate.  Wife states pt's O2 is running 90-92% with inhaler.  No change with ambulation.  Temp was 101.8 and down to 99s with Tylenol.  Wife states pt's cough is worsening and phlegm is thicker.  Provider notified.

## 2019-02-04 NOTE — Telephone Encounter (Addendum)
Patient has COVID and has been seen at [facility]. Patient's wife is concerned that he is not getting better- he still has fever and his O2 sat is low. Patient is using inhaler . Patient has spiked temperature last night- sat 89- 90%. Patient is scheduled for x-ray today. O2 sat- 90% after inhaler.  Wife wants to know if patient would qualify for infusion clinic. Please call her to let her know: [phone]. She is very concerned.  Reason for Disposition . Fever present > 3 days (72 hours)  Answer Assessment - Initial Assessment Questions 1. TEMPERATURE: "What is the most recent temperature?"  "How was it measured?"      99.3 2. ONSET: "When did the fever start?"      Back and forth- seems to rise at night 3. SYMPTOMS: "Do you have any other symptoms besides the fever?"  (e.g., colds, headache, sore throat, earache, cough, rash, diarrhea, vomiting, abdominal pain)     cough 4. CAUSE: If there are no symptoms, ask: "What do you think is causing the fever?"      COVID 5. CONTACTS: "Does anyone else in the family have an infection?"     n/a 6. TREATMENT: "What have you done so far to treat this fever?" (e.g., medications)     Ibuprofen/Tylenol- id high temperature 7. IMMUNOCOMPROMISE: "Do you have of the following: diabetes, HIV positive, splenectomy, cancer chemotherapy, chronic steroid treatment, transplant patient, etc."     Diabetic, high BP 8. PREGNANCY: "Is there any chance you are pregnant?" "When was your last menstrual period?"     n/a 9. TRAVEL: "Have you traveled out of the country in the last month?" (e.g., travel history, exposures)     n/a  Protocols used: FEVER-A-AH

## 2019-02-04 NOTE — Telephone Encounter (Signed)
Patient wife called pt  was triaged earlier by nurse at our [facility] center,Patients wife is calling back , husbands fever is getting higher and pts wife wants infusion ordered. They have an opening this Saturday. Please reach out to patient wife.   Please call [phone]

## 2019-02-04 NOTE — Telephone Encounter (Signed)
Called to Discuss with patient about Covid symptoms and the use of bamlanivimab, a monoclonal antibody infusion for those with mild to moderate Covid symptoms and at a high risk of hospitalization.     Pt is qualified for this infusion at the [facility] infusion center due to co-morbid conditions and/or a member of an at-risk group.     Unable to reach pt - left message for pt to call back.

## 2019-02-04 NOTE — Telephone Encounter (Signed)
CB is  [id number] for pt's wife  She reports pt's ox stat is 90% after using inhaler and would like a cb in regard to qualifying for infusion, please advise

## 2019-02-04 NOTE — Telephone Encounter (Signed)
I spoke with pt's wife and advised her that our clinic is a specialty clinic and that she should call the PCP. She states she has called over there already and is awaiting a call back. I got in touch with the NP at the infusion clinic and she states he can receive an infusion if it is not over 10 days from the onset of symptoms and depending on other health factors. He was diagnosed with Covid at [facility] on 01/29/2019. She is reporting his temperature is going up and his sats are 90% at the lowest. I advised her to go to the emergency room if she doesn't receive a call back soon. The xray is resulted but the provider is not available to review it at this time. FYI

## 2019-02-05 ENCOUNTER — Emergency Department (HOSPITAL_COMMUNITY)
Admission: EM | Admit: 2019-02-05 | Discharge: 2019-02-05 | Disposition: A | Discharge status: Home / Self Care | Payer: Medicare Other | Source: Home / Self Care | Attending: Emergency Medicine | Admitting: Emergency Medicine

## 2019-02-05 ENCOUNTER — Other Ambulatory Visit: Payer: Self-pay

## 2019-02-05 ENCOUNTER — Emergency Department (HOSPITAL_COMMUNITY): Payer: Medicare Other

## 2019-02-05 ENCOUNTER — Encounter (HOSPITAL_COMMUNITY): Payer: Self-pay | Admitting: Emergency Medicine

## 2019-02-05 DIAGNOSIS — Z8616 Personal history of COVID-19: Secondary | ICD-10-CM | POA: Diagnosis not present

## 2019-02-05 DIAGNOSIS — Z833 Family history of diabetes mellitus: Secondary | ICD-10-CM | POA: Diagnosis not present

## 2019-02-05 DIAGNOSIS — E785 Hyperlipidemia, unspecified: Secondary | ICD-10-CM | POA: Diagnosis not present

## 2019-02-05 DIAGNOSIS — Z87891 Personal history of nicotine dependence: Secondary | ICD-10-CM | POA: Insufficient documentation

## 2019-02-05 DIAGNOSIS — Z7984 Long term (current) use of oral hypoglycemic drugs: Secondary | ICD-10-CM | POA: Insufficient documentation

## 2019-02-05 DIAGNOSIS — R509 Fever, unspecified: Secondary | ICD-10-CM | POA: Diagnosis not present

## 2019-02-05 DIAGNOSIS — I10 Essential (primary) hypertension: Secondary | ICD-10-CM | POA: Insufficient documentation

## 2019-02-05 DIAGNOSIS — E1129 Type 2 diabetes mellitus with other diabetic kidney complication: Secondary | ICD-10-CM | POA: Diagnosis not present

## 2019-02-05 DIAGNOSIS — Z298 Encounter for other specified prophylactic measures: Secondary | ICD-10-CM | POA: Diagnosis not present

## 2019-02-05 DIAGNOSIS — E119 Type 2 diabetes mellitus without complications: Secondary | ICD-10-CM | POA: Insufficient documentation

## 2019-02-05 DIAGNOSIS — J1282 Pneumonia due to coronavirus disease 2019: Secondary | ICD-10-CM | POA: Diagnosis present

## 2019-02-05 DIAGNOSIS — N179 Acute kidney failure, unspecified: Secondary | ICD-10-CM | POA: Diagnosis not present

## 2019-02-05 DIAGNOSIS — J9601 Acute respiratory failure with hypoxia: Secondary | ICD-10-CM | POA: Diagnosis not present

## 2019-02-05 DIAGNOSIS — J40 Bronchitis, not specified as acute or chronic: Secondary | ICD-10-CM | POA: Diagnosis not present

## 2019-02-05 DIAGNOSIS — U071 COVID-19: Secondary | ICD-10-CM | POA: Insufficient documentation

## 2019-02-05 DIAGNOSIS — R0602 Shortness of breath: Secondary | ICD-10-CM | POA: Diagnosis not present

## 2019-02-05 DIAGNOSIS — Z9049 Acquired absence of other specified parts of digestive tract: Secondary | ICD-10-CM | POA: Diagnosis not present

## 2019-02-05 DIAGNOSIS — Z8249 Family history of ischemic heart disease and other diseases of the circulatory system: Secondary | ICD-10-CM | POA: Diagnosis not present

## 2019-02-05 DIAGNOSIS — R000 Tachycardia, unspecified: Secondary | ICD-10-CM | POA: Diagnosis not present

## 2019-02-05 DIAGNOSIS — Z23 Encounter for immunization: Secondary | ICD-10-CM | POA: Diagnosis not present

## 2019-02-05 DIAGNOSIS — Z79899 Other long term (current) drug therapy: Secondary | ICD-10-CM | POA: Insufficient documentation

## 2019-02-05 DIAGNOSIS — E1165 Type 2 diabetes mellitus with hyperglycemia: Secondary | ICD-10-CM | POA: Diagnosis not present

## 2019-02-05 DIAGNOSIS — R809 Proteinuria, unspecified: Secondary | ICD-10-CM | POA: Diagnosis not present

## 2019-02-05 LAB — COMPREHENSIVE METABOLIC PANEL
ALT: 59 U/L — ABNORMAL HIGH (ref 0–44)
AST: 45 U/L — ABNORMAL HIGH (ref 15–41)
Albumin: 3.7 g/dL (ref 3.5–5.0)
Alkaline Phosphatase: 84 U/L (ref 38–126)
Anion gap: 9 (ref 5–15)
BUN: 22 mg/dL (ref 8–23)
CO2: 24 mmol/L (ref 22–32)
Calcium: 9.1 mg/dL (ref 8.9–10.3)
Chloride: 100 mmol/L (ref 98–111)
Creatinine, Ser: 1.44 mg/dL — ABNORMAL HIGH (ref 0.61–1.24)
GFR calc Af Amer: 59 mL/min — ABNORMAL LOW (ref >60)
GFR calc non Af Amer: 51 mL/min — ABNORMAL LOW (ref >60)
Glucose, Bld: 287 mg/dL — ABNORMAL HIGH (ref 70–99)
Potassium: 4.4 mmol/L (ref 3.5–5.1)
Sodium: 133 mmol/L — ABNORMAL LOW (ref 135–145)
Total Bilirubin: 0.5 mg/dL (ref 0.3–1.2)
Total Protein: 7.8 g/dL (ref 6.5–8.1)

## 2019-02-05 LAB — D-DIMER, QUANTITATIVE: D-Dimer, Quant: 0.89 ug/mL-FEU — ABNORMAL HIGH (ref 0.00–0.50)

## 2019-02-05 LAB — CBC WITH DIFFERENTIAL/PLATELET
Abs Immature Granulocytes: 0.06 10*3/uL (ref 0.00–0.07)
Basophils Absolute: 0 10*3/uL (ref 0.0–0.1)
Basophils Relative: 0 %
Eosinophils Absolute: 0 10*3/uL (ref 0.0–0.5)
Eosinophils Relative: 0 %
HCT: 47.9 % (ref 39.0–52.0)
Hemoglobin: 15.9 g/dL (ref 13.0–17.0)
Immature Granulocytes: 1 %
Lymphocytes Relative: 16 %
Lymphs Abs: 1.3 10*3/uL (ref 0.7–4.0)
MCH: 28.5 pg (ref 26.0–34.0)
MCHC: 33.2 g/dL (ref 30.0–36.0)
MCV: 85.8 fL (ref 80.0–100.0)
Monocytes Absolute: 0.9 10*3/uL (ref 0.1–1.0)
Monocytes Relative: 11 %
Neutro Abs: 5.8 10*3/uL (ref 1.7–7.7)
Neutrophils Relative %: 72 %
Platelets: 186 10*3/uL (ref 150–400)
RBC: 5.58 MIL/uL (ref 4.22–5.81)
RDW: 14.1 % (ref 11.5–15.5)
WBC: 8 10*3/uL (ref 4.0–10.5)
nRBC: 0 % (ref 0.0–0.2)

## 2019-02-05 LAB — TROPONIN I (HIGH SENSITIVITY)
Troponin I (High Sensitivity): 7 ng/L (ref <18)
Troponin I (High Sensitivity): 5 ng/L (ref <18)

## 2019-02-05 MED ORDER — AEROCHAMBER PLUS FLO-VU MEDIUM MISC
1.0000 | Freq: Once | Status: AC
  Administered 2019-02-05: 1
  Filled 2019-02-05: qty 1

## 2019-02-05 MED ORDER — ACETAMINOPHEN 325 MG PO TABS
650.0000 mg | ORAL_TABLET | Freq: Once | ORAL | Status: AC
  Administered 2019-02-05: 650 mg via ORAL
  Filled 2019-02-05: qty 2

## 2019-02-05 MED ORDER — ALBUTEROL SULFATE HFA 108 (90 BASE) MCG/ACT IN AERS
1.0000 | INHALATION_SPRAY | Freq: Once | RESPIRATORY_TRACT | Status: AC
  Administered 2019-02-05: 2 via RESPIRATORY_TRACT
  Filled 2019-02-05: qty 6.7

## 2019-02-05 MED ORDER — IOHEXOL 350 MG/ML SOLN
100.0000 mL | Freq: Once | INTRAVENOUS | Status: AC | PRN
  Administered 2019-02-05: 100 mL via INTRAVENOUS

## 2019-02-05 NOTE — ED Triage Notes (Signed)
Pt states he was dx with covid on 1/22 and has since had more SOB and fever. Took tylenol last at 12 noon. O2 96%. Alert and oriented. Ambulatory.

## 2019-02-05 NOTE — ED Provider Notes (Signed)
[facility] DEPT Provider Note   CSN: [id number] Arrival date & time: 02/05/19  1528     History Chief Complaint  Patient presents with  . Covid Positive  . Shortness of Breath    Jesse Baldwin is a 64 y.o. male with a history of hypertension, hyperlipidemia, & T2DM who presents to the ED with complaints of dyspnea with known covid 19 diagnoses 01/29/19. Patient states since diagnoses of COVID 19 he has had fevers, chills, cough productive of yellow phlegm sputum, dyspnea, chest discomfort, & loose stools. He states his chest discomfort is central, intermittent, worse with deep breaths, no alleviating factors. Sxs seem worse @ night. He has been monitoring his oxygen @ home and states it has gone into the 80s. He has seen other doctors for this and gotten supportive medicines as well as Augmentin. There was discussion of him being set up at the infusion center @ [place] but there has been some difficulty setting this up per patient & his wife. Denies leg pain/swelling, hemoptysis, recent surgery/trauma, recent long travel, hormone use, personal hx of cancer, hx of VTE, syncope, abdominal pain, nausea, or vomiting.   HPI     Past Medical History:  Diagnosis Date  . Diabetes mellitus without complication ([place])   . Hyperlipidemia   . Hypertension     Patient Active Problem List   Diagnosis Date Noted  . Type 2 diabetes mellitus with microalbuminuria, without long-term current use of insulin ([place]) 01/09/2015  . Hypertension 04/08/2011  . Hyperlipidemia 04/08/2011  . Glaucoma 04/08/2011  . Hearing loss in right ear 04/08/2011    Past Surgical History:  Procedure Laterality Date  . CARPAL TUNNEL RELEASE Bilateral   . CATARACT EXTRACTION Right   . CHOLECYSTECTOMY  1990  . CYST REMOVAL NECK         Family History  Problem Relation Age of Onset  . Diabetes Mother   . Hypertension Mother   . Diabetes Father   . Hypertension Father   . Diabetes  Sister   . Hypertension Sister   . Cancer Sister 50       bone marrow cancer  . Diabetes Brother   . Hypertension Brother   . Colon cancer Neg Hx     Social History   Tobacco Use  . Smoking status: Former Smoker    Quit date: 04/07/1981    Years since quitting: 37.8  . Smokeless tobacco: Never Used  Substance Use Topics  . Alcohol use: No  . Drug use: No    Home Medications Prior to Admission medications   Medication Sig Start Date End Date Taking? Authorizing Provider  amLODipine (NORVASC) 5 MG tablet Take 5 mg by mouth daily. 10/29/18   [provider]  amoxicillin-clavulanate (AUGMENTIN) 875-125 MG tablet Take 1 tablet by mouth 2 (two) times daily. 02/03/19   Scot Jun, FNP  benzonatate (TESSALON) 100 MG capsule Take 1 capsule (100 mg total) by mouth 2 (two) times daily as needed for cough. 02/02/19   Maximiano Coss, NP  guaiFENesin-dextromethorphan (ROBITUSSIN DM) 100-10 MG/5ML syrup Take 5 mLs by mouth every 4 (four) hours as needed for cough. 02/02/19   Maximiano Coss, NP  latanoprost (XALATAN) 0.005 % ophthalmic solution 1 drop at bedtime.    [provider]  losartan (COZAAR) 100 MG tablet Take 1 tablet (100 mg total) by mouth daily. 09/08/18   Rutherford Guys, MD  metFORMIN (GLUCOPHAGE-XR) 500 MG 24 hr tablet Take 2 tablets (1,000 mg  total) by mouth 2 (two) times daily with a meal. 12/08/18   Rutherford Guys, MD  Promethazine-Codeine 6.25-10 MG/5ML SOLN Take 5 mLs by mouth at bedtime. 02/02/19   Maximiano Coss, NP  repaglinide (PRANDIN) 1 MG tablet TAKE 1 TABLET (1 MG TOTAL) BY MOUTH 3 (THREE) TIMES DAILY BEFORE MEALS. 09/03/18   Rutherford Guys, MD  rosuvastatin (CRESTOR) 20 MG tablet Take 1 tablet (20 mg total) by mouth daily. 03/13/18   Rutherford Guys, MD    Allergies    Septra [bactrim] and Sulfa antibiotics  Review of Systems   Review of Systems  Constitutional: Positive for chills, fatigue and fever.  HENT: Negative for congestion, ear  pain and sore throat.   Respiratory: Positive for cough and shortness of breath.   Cardiovascular: Positive for chest pain.  Gastrointestinal: Positive for diarrhea. Negative for abdominal pain, nausea and vomiting.  Genitourinary: Negative for dysuria.  Neurological: Negative for syncope.  All other systems reviewed and are negative.   Physical Exam Updated Vital Signs BP 127/89 (BP Location: Left Arm)   Pulse (!) 125   Temp (!) 100.8 F (38.2 C) (Oral)   Resp 20   SpO2 96%   Physical Exam Vitals and nursing note reviewed.  Constitutional:      General: He is not in acute distress.    Appearance: He is well-developed. He is not toxic-appearing.  HENT:     Head: Normocephalic and atraumatic.  Eyes:     General:        Right eye: No discharge.        Left eye: No discharge.     Conjunctiva/sclera: Conjunctivae normal.  Cardiovascular:     Rate and Rhythm: Regular rhythm. Tachycardia present.     Comments: 2+ symmetric radial/DP pulses.  Pulmonary:     Effort: Tachypnea (mild) present. No respiratory distress.     Breath sounds: Normal breath sounds. No wheezing, rhonchi or rales.     Comments: SpO2 96-98% on RA with rest & ambulation.  Chest:     Chest wall: No tenderness.  Abdominal:     General: There is no distension.     Palpations: Abdomen is soft.     Tenderness: There is no abdominal tenderness. There is no guarding or rebound.  Musculoskeletal:     Cervical back: Neck supple.     Right lower leg: No tenderness. No edema.     Left lower leg: No tenderness. No edema.  Skin:    General: Skin is warm and dry.     Findings: No rash.  Neurological:     Mental Status: He is alert.     Comments: Clear speech.   Psychiatric:        Behavior: Behavior normal.    ED Results / Procedures / Treatments   Labs (all labs ordered are listed, but only abnormal results are displayed) Labs Reviewed  COMPREHENSIVE METABOLIC PANEL - Abnormal; Notable for the following  components:      Result Value   Sodium 133 (*)    Glucose, Bld 287 (*)    Creatinine, Ser 1.44 (*)    AST 45 (*)    ALT 59 (*)    GFR calc non Af Amer 51 (*)    GFR calc Af Amer 59 (*)    All other components within normal limits  D-DIMER, QUANTITATIVE (NOT AT [facility]) - Abnormal; Notable for the following components:   D-Dimer, Quant 0.89 (*)    All  other components within normal limits  CBC WITH DIFFERENTIAL/PLATELET  TROPONIN I (HIGH SENSITIVITY)  TROPONIN I (HIGH SENSITIVITY)    EKG EKG Interpretation  Date/Time:  Friday February 05 2019 17:00:22 EST Ventricular Rate:  112 PR Interval:    QRS Duration: 87 QT Interval:  313 QTC Calculation: 428 R Axis:   75 Text Interpretation: Sinus tachycardia Borderline ST elevation, lateral leads Baseline wander in lead(s) III Confirmed by Madalyn Rob [phone]) on 02/05/2019 10:12:14 PM   Radiology DG Chest 2 View  Result Date: 02/04/2019 CLINICAL DATA:  Cough and shortness of breath.  COVID-19 positive EXAM: CHEST - 2 VIEW COMPARISON:  None. FINDINGS: There is atelectatic change in the left mid lung. Lungs elsewhere clear. Heart size and pulmonary vascularity are normal. No adenopathy. There is aortic atherosclerosis. There is degenerative change in the thoracic spine. IMPRESSION: Atelectatic change left mid lung.  Lungs elsewhere clear. No adenopathy. Heart size normal. Aortic Atherosclerosis (ICD10-I70.0). Electronically Signed   By: Lowella Grip III M.D.   On: 02/04/2019 09:56   CT Angio Chest PE W/Cm &/Or Wo Cm  Result Date: 02/05/2019 CLINICAL DATA:  Shortness of breath and fever. EXAM: CT ANGIOGRAPHY CHEST WITH CONTRAST TECHNIQUE: Multidetector CT imaging of the chest was performed using the standard protocol during bolus administration of intravenous contrast. Multiplanar CT image reconstructions and MIPs were obtained to evaluate the vascular anatomy. CONTRAST:  149mL OMNIPAQUE IOHEXOL 350 MG/ML SOLN COMPARISON:  None.  FINDINGS: Cardiovascular: There is mild calcification of the thoracic aorta satisfactory opacification of the pulmonary arteries to the segmental level. No evidence of pulmonary embolism. Normal heart size. No pericardial effusion. Marked severity coronary artery calcification is seen. Mediastinum/Nodes: No enlarged mediastinal, hilar, or axillary lymph nodes. Thyroid gland, trachea, and esophagus demonstrate no significant findings. Lungs/Pleura: Moderate severity patchy infiltrates are seen throughout both lungs. There is no evidence of a pleural effusion or pneumothorax. Upper Abdomen: There is a small hiatal hernia. Multiple surgical clips are seen within the gallbladder fossa. Musculoskeletal: Degenerative changes seen throughout the thoracic spine Review of the MIP images confirms the above findings. IMPRESSION: 1. No CT evidence of pulmonary embolism. 2. Moderate severity patchy infiltrates throughout both lungs. 3. Marked severity coronary artery calcification. 4. Small hiatal hernia. Electronically Signed   By: Virgina Norfolk M.D.   On: 02/05/2019 19:13   DG Chest Port 1 View  Result Date: 02/05/2019 CLINICAL DATA:  Shortness of breath and fever. EXAM: PORTABLE CHEST 1 VIEW COMPARISON:  February 04, 2019 FINDINGS: Multiple overlying radiopaque cardiac lead wires are seen. Very mild atelectasis and/or infiltrate is seen within the right lung base. This represents a new finding when compared to the prior study. There is no evidence of a pleural effusion or pneumothorax. The heart size and mediastinal contours are within normal limits. The visualized skeletal structures are unremarkable. IMPRESSION: Very mild right basilar atelectasis and/or infiltrate. This represents a new finding when compared to the prior study dated February 04, 2019. Electronically Signed   By: Virgina Norfolk M.D.   On: 02/05/2019 17:37    Procedures Procedures (including critical care time)  Medications Ordered in  ED Medications  acetaminophen (TYLENOL) tablet 650 mg (650 mg Oral Given 02/05/19 1630)  albuterol (VENTOLIN HFA) 108 (90 Base) MCG/ACT inhaler 1-2 puff (2 puffs Inhalation Given 02/05/19 1705)  AeroChamber Plus Flo-Vu Medium MISC 1 each (1 each Other Given 02/05/19 1705)  iohexol (OMNIPAQUE) 350 MG/ML injection 100 mL (100 mLs Intravenous Contrast Given 02/05/19 1850)  ED Course  I have reviewed the triage vital signs and the nursing notes.  Pertinent labs & imaging results that were available during my care of the patient were reviewed by me and considered in my medical decision making (see chart for details).  Donis Mcauley was evaluated in Emergency Department on 02/05/2019 for the symptoms described in the history of present illness. He/she was evaluated in the context of the global COVID-19 pandemic, which necessitated consideration that the patient might be at risk for infection with the SARS-CoV-2 virus that causes COVID-19. Institutional protocols and algorithms that pertain to the evaluation of patients at risk for COVID-19 are in a state of rapid change based on information released by regulatory bodies including the CDC and federal and state organizations. These policies and algorithms were followed during the patient's care in the ED.    MDM Rules/Calculators/A&P                      Patient with known COVID 19 diagnoses 01/29/19 presents to the ED with worsening dyspnea.  Patient is nontoxic, febrile with potentially resultant tachycardia & tachypnea. Lungs are clear.  Will trial albuterol inhaler with spacer for symptomatic relief & give tylenol for fever.   CXR: Very mild right basilar atelectasis and/or infiltrate. This represents a new finding when compared to the prior study dated February 04, 2019 CBC: No anemia, leukocytosis, leukopenia, or platelet dysfunction.  CMP:  Mildly elevated creatinine @ 1.44 compared to prior 1.09. Mildly elevated LFTs likely secondary to COVID  19. Hyperglycemia w/o acidosis or anion gap elevation. No significant electrolyte derangement.  EKG: No STEMI, borderline ST elevation lateral leads Troponin: 7, 5 D-dimer + --> CTA ordered: 1. No CT evidence of pulmonary embolism. 2. Moderate severity patchy infiltrates throughout both lungs. 3. Marked severity coronary artery calcification. 4. Small hiatal hernia.  No significant troponin elevation - do not suspect ACS CTA negative for PE No widened mediastinum on imaging, symmetric pulses- doubt dissection.   Sxs overall likely secondary to COVID 19 Patient is ambulatory in the ED maintaining SPO2 > 95% on RA. He remains with RR < 25 on each of my assessments. On last assessment he is walking throughout exam room eating without difficulty. He overall appears appropriate for discharge home to follow up with the infusion center. Can continue his augmentin. Will send home with albuterol inhaler with spacer. Given he is not hypoxic steroids not specifically indicated. Patient provided consent to discuss with his wife. Both myself & Dr. Roslynn Amble called & spoke with patient's wife via telephone regarding his results & plan of care at length as she had concerns regarding his illness, we explained given his overall reassuring work-up and ambulatory SpO2 maintaining >95% that he does not require admission currently, we discussed the natural course of COVID illness including very strict return precautions, we have contacted the infusion center to facilitate this in follow up as this was prior plan. Incentive spirometer ordered in the ED. Will discharge home. I discussed results, treatment plan, need for follow-up, and return precautions with the patient & his wife via . Provided opportunity for questions, patient confirmed understanding and is in agreement with plan.   This is a shared visit with supervising physician Dr. Roslynn Amble who has independently evaluated patient & provided guidance, in agreement with care    Final Clinical Impression(s) / ED Diagnoses Final diagnoses:  COVID-19  Shortness of breath    Rx / DC Orders ED Discharge Orders  None       Amaryllis Dyke, PA-C 02/05/19 2314    Lucrezia Starch, MD 02/06/19 2134

## 2019-02-05 NOTE — ED Notes (Signed)
Patient's SPO2 remained at 96-97% while ambulating in room.

## 2019-02-05 NOTE — Discharge Instructions (Addendum)
You were seen in the emergency department today for trouble breathing with a known diagnosis of COVID-19.  Your labs show that your kidney function (creatinine) is mildly elevated at 1.44 and your liver function tests were mildly elevated (AST 45, ALT 59), we would like you to have these rechecked by primary care provider within 1 week.  Your heart enzymes did not show signs of an acute heart attack.  Your CT scan did not show findings of a pulmonary embolism otherwise known as a blood clot in your lungs. Your imaging did show findings of infiltrates which frequently occurs with covid 19. Your CT scan also showed a small hiatal hernia and some calcification of your coronary arteries- please discuss these findings with your primary care provider within 1 week.  Please continue to take your Augmentin at home.  We are sending you home with an albuterol inhaler with a spacer, please use 1 to 2 puffs every 4-6 hours as needed for trouble breathing/wheezing.  We are also sending you home with an incentive spirometer to use 5 times per day to help maintain lung function.  We have called the infusion center and left a message to help facilitate you being set up for infusions.  Please call the number [phone] with any questions.  Please call tomorrow morning.  Please continue to monitor your symptoms at home.   Follow-up with your primary care provider within 3 days.  Return to the ER anytime for new or worsening symptoms including but not limited to increased work of breathing, increased pain, passing out, inability to improve fever with over-the-counter fever medicines, new pain, oxygen not above 90% at home on your At Home monitor, or any other concerns.

## 2019-02-05 NOTE — Telephone Encounter (Signed)
Pt wife calling back to inform that she never received a call from provider.   He was seen at another clinic which scheduled him in the respiratory clinic  Respiratory clinic gave prescription  And scheduled x-ray . They told them to go to the ER. For his inusion .

## 2019-02-06 ENCOUNTER — Encounter: Payer: Self-pay | Admitting: Nurse Practitioner

## 2019-02-06 ENCOUNTER — Other Ambulatory Visit (HOSPITAL_COMMUNITY): Payer: Self-pay | Admitting: Nurse Practitioner

## 2019-02-06 ENCOUNTER — Emergency Department (HOSPITAL_COMMUNITY)
Admission: EM | Admit: 2019-02-06 | Discharge: 2019-02-07 | Disposition: A | Discharge status: Home / Self Care | Payer: Medicare Other | Source: Home / Self Care | Attending: Emergency Medicine | Admitting: Emergency Medicine

## 2019-02-06 ENCOUNTER — Other Ambulatory Visit: Payer: Self-pay

## 2019-02-06 ENCOUNTER — Telehealth (HOSPITAL_COMMUNITY): Payer: Self-pay | Admitting: Nurse Practitioner

## 2019-02-06 ENCOUNTER — Encounter (HOSPITAL_COMMUNITY): Payer: Self-pay

## 2019-02-06 DIAGNOSIS — U071 COVID-19: Secondary | ICD-10-CM | POA: Insufficient documentation

## 2019-02-06 DIAGNOSIS — Z79899 Other long term (current) drug therapy: Secondary | ICD-10-CM | POA: Insufficient documentation

## 2019-02-06 DIAGNOSIS — E119 Type 2 diabetes mellitus without complications: Secondary | ICD-10-CM | POA: Insufficient documentation

## 2019-02-06 DIAGNOSIS — I10 Essential (primary) hypertension: Secondary | ICD-10-CM | POA: Insufficient documentation

## 2019-02-06 DIAGNOSIS — Z87891 Personal history of nicotine dependence: Secondary | ICD-10-CM | POA: Insufficient documentation

## 2019-02-06 DIAGNOSIS — Z7984 Long term (current) use of oral hypoglycemic drugs: Secondary | ICD-10-CM | POA: Insufficient documentation

## 2019-02-06 LAB — CBC WITH DIFFERENTIAL/PLATELET
Abs Immature Granulocytes: 0.04 10*3/uL (ref 0.00–0.07)
Basophils Absolute: 0 10*3/uL (ref 0.0–0.1)
Basophils Relative: 0 %
Eosinophils Absolute: 0 10*3/uL (ref 0.0–0.5)
Eosinophils Relative: 0 %
HCT: 45.6 % (ref 39.0–52.0)
Hemoglobin: 15.1 g/dL (ref 13.0–17.0)
Immature Granulocytes: 1 %
Lymphocytes Relative: 16 %
Lymphs Abs: 1.2 10*3/uL (ref 0.7–4.0)
MCH: 28.4 pg (ref 26.0–34.0)
MCHC: 33.1 g/dL (ref 30.0–36.0)
MCV: 85.7 fL (ref 80.0–100.0)
Monocytes Absolute: 0.8 10*3/uL (ref 0.1–1.0)
Monocytes Relative: 10 %
Neutro Abs: 5.4 10*3/uL (ref 1.7–7.7)
Neutrophils Relative %: 73 %
Platelets: 172 10*3/uL (ref 150–400)
RBC: 5.32 MIL/uL (ref 4.22–5.81)
RDW: 14.2 % (ref 11.5–15.5)
WBC: 7.4 10*3/uL (ref 4.0–10.5)
nRBC: 0 % (ref 0.0–0.2)

## 2019-02-06 LAB — COMPREHENSIVE METABOLIC PANEL
ALT: 78 U/L — ABNORMAL HIGH (ref 0–44)
AST: 76 U/L — ABNORMAL HIGH (ref 15–41)
Albumin: 3.4 g/dL — ABNORMAL LOW (ref 3.5–5.0)
Alkaline Phosphatase: 83 U/L (ref 38–126)
Anion gap: 10 (ref 5–15)
BUN: 19 mg/dL (ref 8–23)
CO2: 23 mmol/L (ref 22–32)
Calcium: 8.5 mg/dL — ABNORMAL LOW (ref 8.9–10.3)
Chloride: 99 mmol/L (ref 98–111)
Creatinine, Ser: 1.59 mg/dL — ABNORMAL HIGH (ref 0.61–1.24)
GFR calc Af Amer: 52 mL/min — ABNORMAL LOW (ref >60)
GFR calc non Af Amer: 45 mL/min — ABNORMAL LOW (ref >60)
Glucose, Bld: 360 mg/dL — ABNORMAL HIGH (ref 70–99)
Potassium: 4.2 mmol/L (ref 3.5–5.1)
Sodium: 132 mmol/L — ABNORMAL LOW (ref 135–145)
Total Bilirubin: 1.1 mg/dL (ref 0.3–1.2)
Total Protein: 7.4 g/dL (ref 6.5–8.1)

## 2019-02-06 LAB — BLOOD GAS, VENOUS
Acid-Base Excess: 2.1 mmol/L — ABNORMAL HIGH (ref 0.0–2.0)
Bicarbonate: 25.7 mmol/L (ref 20.0–28.0)
O2 Saturation: 86.5 %
Patient temperature: 98.6
pCO2, Ven: 38.3 mmHg — ABNORMAL LOW (ref 44.0–60.0)
pH, Ven: 7.441 — ABNORMAL HIGH (ref 7.250–7.430)
pO2, Ven: 52.1 mmHg — ABNORMAL HIGH (ref 32.0–45.0)

## 2019-02-06 NOTE — Telephone Encounter (Signed)
Called & discussed symptoms and risk factors of covid infection. Patient tested positive at [facility] on 1/22 which is first day he was symptomatic. Was seen in ER yesterday. Monitoring SpO2 at home. Not requiring supplemental oxygen. See orders encounter regarding MAB therapy.

## 2019-02-06 NOTE — ED Triage Notes (Signed)
Pt reports testing positive for COVID on 1/22. Pt reports SHOB and fever. Pt reports taking tylenol earlier today. Pt was seen here yesterday for same.

## 2019-02-06 NOTE — ED Provider Notes (Signed)
Jesse Baldwin   CSN: [id number] Arrival date & time: 02/06/19  2115     History Chief Complaint  Patient presents with  . Covid Positive  . Shortness of Breath  . Fever    Jesse Baldwin is a 64 y.o. male.  Patient with history of T2DM, HTN, HLD, recent dx COVID-19 (01/29/19) returns to ED with concern for difficult breathing and SOB. Seen yesterday for same and discharged home, scheduled for infusion (antibody?) on Monday, 02/08/19 at [facility]. Jesse Baldwin reports Jesse Baldwin runs a fever at home despite regular use of Tylenol and SOB with O2 sats that go into the 80's when ambulating. At rest his breathing is comfortable. Jesse Baldwin has chest tightness. No vomiting.   The history is provided by the patient. No language interpreter was used (English is fluent).  Shortness of Breath Associated symptoms: fever   Associated symptoms: no vomiting   Fever Associated symptoms: myalgias   Associated symptoms: no diarrhea and no vomiting        Past Medical History:  Diagnosis Date  . Diabetes mellitus without complication ([place])   . Hyperlipidemia   . Hypertension     Patient Active Problem List   Diagnosis Date Noted  . Type 2 diabetes mellitus with microalbuminuria, without long-term current use of insulin ([place]) 01/09/2015  . Hypertension 04/08/2011  . Hyperlipidemia 04/08/2011  . Glaucoma 04/08/2011  . Hearing loss in right ear 04/08/2011    Past Surgical History:  Procedure Laterality Date  . CARPAL TUNNEL RELEASE Bilateral   . CATARACT EXTRACTION Right   . CHOLECYSTECTOMY  1990  . CYST REMOVAL NECK         Family History  Problem Relation Age of Onset  . Diabetes Mother   . Hypertension Mother   . Diabetes Father   . Hypertension Father   . Diabetes Sister   . Hypertension Sister   . Cancer Sister 50       bone marrow cancer  . Diabetes Brother   . Hypertension Brother   . Colon cancer Neg Hx     Social History   Tobacco Use  .  Smoking status: Former Smoker    Quit date: 04/07/1981    Years since quitting: 37.8  . Smokeless tobacco: Never Used  Substance Use Topics  . Alcohol use: No  . Drug use: No    Home Medications Prior to Admission medications   Medication Sig Start Date End Date Taking? Authorizing Provider  acetaminophen (TYLENOL) 500 MG tablet Take 1,000 mg by mouth every 6 (six) hours as needed for moderate pain or fever.    [provider]  amLODipine (NORVASC) 5 MG tablet Take 5 mg by mouth daily. 10/29/18   [provider]  amoxicillin-clavulanate (AUGMENTIN) 875-125 MG tablet Take 1 tablet by mouth 2 (two) times daily. 02/03/19   Scot Jun, FNP  benzonatate (TESSALON) 100 MG capsule Take 1 capsule (100 mg total) by mouth 2 (two) times daily as needed for cough. 02/02/19   Maximiano Coss, NP  guaiFENesin-dextromethorphan (ROBITUSSIN DM) 100-10 MG/5ML syrup Take 5 mLs by mouth every 4 (four) hours as needed for cough. 02/02/19   Maximiano Coss, NP  latanoprost (XALATAN) 0.005 % ophthalmic solution 1 drop at bedtime.    [provider]  losartan (COZAAR) 100 MG tablet Take 1 tablet (100 mg total) by mouth daily. 09/08/18   Rutherford Guys, MD  metFORMIN (GLUCOPHAGE-XR) 500 MG 24 hr tablet Take 2 tablets (1,000  mg total) by mouth 2 (two) times daily with a meal. 12/08/18   Rutherford Guys, MD  Promethazine-Codeine 6.25-10 MG/5ML SOLN Take 5 mLs by mouth at bedtime. Patient not taking: Reported on 02/05/2019 02/02/19   Maximiano Coss, NP  repaglinide (PRANDIN) 1 MG tablet TAKE 1 TABLET (1 MG TOTAL) BY MOUTH 3 (THREE) TIMES DAILY BEFORE MEALS. 09/03/18   Rutherford Guys, MD  rosuvastatin (CRESTOR) 20 MG tablet Take 1 tablet (20 mg total) by mouth daily. 03/13/18   Rutherford Guys, MD    Allergies    Septra [bactrim] and Sulfa antibiotics  Review of Systems   Review of Systems  Constitutional: Positive for fatigue and fever.  Respiratory: Positive for chest tightness and  shortness of breath.   Gastrointestinal: Negative for diarrhea and vomiting.  Musculoskeletal: Positive for myalgias.  Neurological: Positive for weakness (Generalized).    Physical Exam Updated Vital Signs BP 111/66 (BP Location: Left Arm)   Pulse 96   Temp 99.1 F (37.3 C) (Oral)   Resp (!) 25   Ht 5\' 2"  (1.575 m)   Wt 70 kg   SpO2 92%   BMI 28.23 kg/m   Physical Exam Vitals and nursing Baldwin reviewed.  Constitutional:      General: Jesse Baldwin is not in acute distress.    Appearance: Jesse Baldwin is well-developed.  HENT:     Head: Normocephalic.  Cardiovascular:     Rate and Rhythm: Normal rate and regular rhythm.  Pulmonary:     Effort: Pulmonary effort is normal.     Breath sounds: Rales (Scattered, mild) present. No wheezing or rhonchi.  Abdominal:     General: Bowel sounds are normal.     Palpations: Abdomen is soft.     Tenderness: There is no abdominal tenderness. There is no guarding or rebound.  Musculoskeletal:        General: Normal range of motion.     Cervical back: Normal range of motion and neck supple.  Skin:    General: Skin is warm and dry.     Findings: No rash.  Neurological:     Mental Status: Jesse Baldwin is alert and oriented to person, place, and time.     ED Results / Procedures / Treatments   Labs (all labs ordered are listed, but only abnormal results are displayed) Labs Reviewed  CBC WITH DIFFERENTIAL/PLATELET  COMPREHENSIVE METABOLIC PANEL  BLOOD GAS, VENOUS    EKG None  Radiology CT Angio Chest PE W/Cm &/Or Wo Cm  Result Date: 02/05/2019 CLINICAL DATA:  Shortness of breath and fever. EXAM: CT ANGIOGRAPHY CHEST WITH CONTRAST TECHNIQUE: Multidetector CT imaging of the chest was performed using the standard protocol during bolus administration of intravenous contrast. Multiplanar CT image reconstructions and MIPs were obtained to evaluate the vascular anatomy. CONTRAST:  157mL OMNIPAQUE IOHEXOL 350 MG/ML SOLN COMPARISON:  None. FINDINGS: Cardiovascular:  There is mild calcification of the thoracic aorta satisfactory opacification of the pulmonary arteries to the segmental level. No evidence of pulmonary embolism. Normal heart size. No pericardial effusion. Marked severity coronary artery calcification is seen. Mediastinum/Nodes: No enlarged mediastinal, hilar, or axillary lymph nodes. Thyroid gland, trachea, and esophagus demonstrate no significant findings. Lungs/Pleura: Moderate severity patchy infiltrates are seen throughout both lungs. There is no evidence of a pleural effusion or pneumothorax. Upper Abdomen: There is a small hiatal hernia. Multiple surgical clips are seen within the gallbladder fossa. Musculoskeletal: Degenerative changes seen throughout the thoracic spine Review of the MIP images confirms the above  findings. IMPRESSION: 1. No CT evidence of pulmonary embolism. 2. Moderate severity patchy infiltrates throughout both lungs. 3. Marked severity coronary artery calcification. 4. Small hiatal hernia. Electronically Signed   By: Virgina Norfolk M.D.   On: 02/05/2019 19:13   DG Chest Port 1 View  Result Date: 02/05/2019 CLINICAL DATA:  Shortness of breath and fever. EXAM: PORTABLE CHEST 1 VIEW COMPARISON:  February 04, 2019 FINDINGS: Multiple overlying radiopaque cardiac lead wires are seen. Very mild atelectasis and/or infiltrate is seen within the right lung base. This represents a new finding when compared to the prior study. There is no evidence of a pleural effusion or pneumothorax. The heart size and mediastinal contours are within normal limits. The visualized skeletal structures are unremarkable. IMPRESSION: Very mild right basilar atelectasis and/or infiltrate. This represents a new finding when compared to the prior study dated February 04, 2019. Electronically Signed   By: Virgina Norfolk M.D.   On: 02/05/2019 17:37    Procedures Procedures (including critical care time)  Medications Ordered in ED Medications - No data to  display  ED Course  I have reviewed the triage vital signs and the nursing notes.  Pertinent labs & imaging results that were available during my care of the patient were reviewed by me and considered in my medical decision making (see chart for details).    MDM Rules/Calculators/A&P                      Patient to ED with continuous DOE, hypoxia as measured at home into the 80's, chest tightness, persistent fever.   Chart reviewed. CTA performed yesterday negative for PE, c/w COVID PNA. Jesse Baldwin had a mild AKI Cr 1.44, hyponatremia of 130. Will repeat labs to insure these are not worsening. VBG will be reviewed.   Cr 1.59, liver enzymes mildly elevated but <100. VSS. Jesse Baldwin is ambulated in the room and maintains O2 saturations >92%.   Discussed discharge home. Patient is comfortable with disposition. Jesse Baldwin is encouraged to keep the infusion appointment for Monday. Also discussed that any time Jesse Baldwin feels his symptoms are worse or Jesse Baldwin has a concern, returning to the ED is appropriate.   Final Clinical Impression(s) / ED Diagnoses Final diagnoses:  None   1. COVID-19 infection  Rx / DC Orders ED Discharge Orders    None       Charlann Lange, PA-C 02/07/19 [place], [place], DO 02/07/19 1457

## 2019-02-06 NOTE — Progress Notes (Signed)
I connected by phone with Jesse Baldwin on 02/06/2019 at 8:14 AM to discuss the potential use of an new treatment for mild to moderate COVID-19 viral infection in non-hospitalized patients.  This patient is a 64 y.o. male that meets the FDA criteria for Emergency Use Authorization of bamlanivimab or casirivimab\imdevimab.  Has a (+) direct SARS-CoV-2 viral test result  Has mild or moderate COVID-19   Is ? 64 years of age and weighs ? 40 kg  Is NOT hospitalized due to COVID-19  Is NOT requiring oxygen therapy or requiring an increase in baseline oxygen flow rate due to COVID-19  Is within 10 days of symptom onset  Has at least one of the high risk factor(s) for progression to severe COVID-19 and/or hospitalization as defined in EUA.  Specific high risk criteria : >/= 66 yo, > 55 with hptn, diabetes.  Patient became symptomatic on 1/22 and tested positive at [facility] in [place]. He will bring copy of test results to infusion center on day of test. He continues to have cough and low grade fever.   I have spoken and communicated the following to the patient or parent/caregiver:  1. FDA has authorized the emergency use of bamlanivimab and casirivimab\imdevimab for the treatment of mild to moderate COVID-19 in adults and pediatric patients with positive results of direct SARS-CoV-2 viral testing who are 18 years of age and older weighing at least 40 kg, and who are at high risk for progressing to severe COVID-19 and/or hospitalization.  2. The significant known and potential risks and benefits of bamlanivimab and casirivimab\imdevimab, and the extent to which such potential risks and benefits are unknown.  3. Information on available alternative treatments and the risks and benefits of those alternatives, including clinical trials.  4. Patients treated with bamlanivimab and casirivimab\imdevimab should continue to self-isolate and use infection control measures (e.g., wear mask, isolate, social  distance, avoid sharing personal items, clean and disinfect "high touch" surfaces, and frequent handwashing) according to CDC guidelines.   5. The patient or parent/caregiver has the option to accept or refuse bamlanivimab or casirivimab\imdevimab .  After reviewing this information with the patient, The patient agreed to proceed with receiving the casirivimab\imdevimab infusion and will be provided a copy of the Fact sheet prior to receiving the infusion.Jesse Baldwin, [place], AGNP-C [phone] ([place])

## 2019-02-06 NOTE — ED Notes (Signed)
Pt ambulated around the room on pulse oximetry about 5-6 times. Initial oxygen saturation was 93-94%. While ambulating, it was 92-94%, and he reported feeling fine walking around.

## 2019-02-07 NOTE — Discharge Instructions (Addendum)
Continue inhaler use - 2 puffs every 3-4 hours for shortness of breath.   Please return to the emergency department with any concern for shortness of breath or worsening condition.

## 2019-02-08 ENCOUNTER — Other Ambulatory Visit: Payer: Self-pay

## 2019-02-08 ENCOUNTER — Inpatient Hospital Stay (HOSPITAL_COMMUNITY)
Admission: AD | Admit: 2019-02-08 | Discharge: 2019-02-11 | DRG: 177 | Disposition: A | Discharge status: Home / Self Care | Payer: Medicare Other | Attending: Internal Medicine | Admitting: Internal Medicine

## 2019-02-08 ENCOUNTER — Ambulatory Visit (HOSPITAL_COMMUNITY)
Admission: RE | Admit: 2019-02-08 | Discharge: 2019-02-08 | Disposition: A | Discharge status: Home / Self Care | Payer: Medicare Other | Source: Ambulatory Visit | Attending: Pulmonary Disease | Admitting: Pulmonary Disease

## 2019-02-08 ENCOUNTER — Encounter (HOSPITAL_COMMUNITY): Payer: Self-pay

## 2019-02-08 ENCOUNTER — Inpatient Hospital Stay (HOSPITAL_COMMUNITY): Payer: Medicare Other

## 2019-02-08 DIAGNOSIS — J9601 Acute respiratory failure with hypoxia: Secondary | ICD-10-CM | POA: Diagnosis present

## 2019-02-08 DIAGNOSIS — U071 COVID-19: Secondary | ICD-10-CM | POA: Diagnosis present

## 2019-02-08 DIAGNOSIS — Z87891 Personal history of nicotine dependence: Secondary | ICD-10-CM

## 2019-02-08 DIAGNOSIS — E1129 Type 2 diabetes mellitus with other diabetic kidney complication: Secondary | ICD-10-CM | POA: Diagnosis present

## 2019-02-08 DIAGNOSIS — J1282 Pneumonia due to coronavirus disease 2019: Secondary | ICD-10-CM | POA: Diagnosis present

## 2019-02-08 DIAGNOSIS — Z79899 Other long term (current) drug therapy: Secondary | ICD-10-CM

## 2019-02-08 DIAGNOSIS — E785 Hyperlipidemia, unspecified: Secondary | ICD-10-CM | POA: Diagnosis present

## 2019-02-08 DIAGNOSIS — Z9049 Acquired absence of other specified parts of digestive tract: Secondary | ICD-10-CM | POA: Diagnosis not present

## 2019-02-08 DIAGNOSIS — Z833 Family history of diabetes mellitus: Secondary | ICD-10-CM | POA: Diagnosis not present

## 2019-02-08 DIAGNOSIS — R05 Cough: Secondary | ICD-10-CM

## 2019-02-08 DIAGNOSIS — R059 Cough, unspecified: Secondary | ICD-10-CM

## 2019-02-08 DIAGNOSIS — I10 Essential (primary) hypertension: Secondary | ICD-10-CM | POA: Diagnosis not present

## 2019-02-08 DIAGNOSIS — N179 Acute kidney failure, unspecified: Secondary | ICD-10-CM | POA: Diagnosis not present

## 2019-02-08 DIAGNOSIS — Z23 Encounter for immunization: Secondary | ICD-10-CM | POA: Diagnosis not present

## 2019-02-08 DIAGNOSIS — R809 Proteinuria, unspecified: Secondary | ICD-10-CM | POA: Diagnosis present

## 2019-02-08 DIAGNOSIS — J40 Bronchitis, not specified as acute or chronic: Secondary | ICD-10-CM

## 2019-02-08 DIAGNOSIS — Z8249 Family history of ischemic heart disease and other diseases of the circulatory system: Secondary | ICD-10-CM

## 2019-02-08 DIAGNOSIS — E1165 Type 2 diabetes mellitus with hyperglycemia: Secondary | ICD-10-CM | POA: Diagnosis present

## 2019-02-08 DIAGNOSIS — Z7984 Long term (current) use of oral hypoglycemic drugs: Secondary | ICD-10-CM | POA: Diagnosis not present

## 2019-02-08 DIAGNOSIS — Z298 Encounter for other specified prophylactic measures: Secondary | ICD-10-CM | POA: Diagnosis not present

## 2019-02-08 LAB — COMPREHENSIVE METABOLIC PANEL
ALT: 146 U/L — ABNORMAL HIGH (ref 0–44)
AST: 125 U/L — ABNORMAL HIGH (ref 15–41)
Albumin: 3.1 g/dL — ABNORMAL LOW (ref 3.5–5.0)
Alkaline Phosphatase: 108 U/L (ref 38–126)
Anion gap: 11 (ref 5–15)
BUN: 18 mg/dL (ref 8–23)
CO2: 24 mmol/L (ref 22–32)
Calcium: 8.9 mg/dL (ref 8.9–10.3)
Chloride: 99 mmol/L (ref 98–111)
Creatinine, Ser: 1.52 mg/dL — ABNORMAL HIGH (ref 0.61–1.24)
GFR calc Af Amer: 55 mL/min — ABNORMAL LOW (ref >60)
GFR calc non Af Amer: 47 mL/min — ABNORMAL LOW (ref >60)
Glucose, Bld: 323 mg/dL — ABNORMAL HIGH (ref 70–99)
Potassium: 4.7 mmol/L (ref 3.5–5.1)
Sodium: 134 mmol/L — ABNORMAL LOW (ref 135–145)
Total Bilirubin: 0.8 mg/dL (ref 0.3–1.2)
Total Protein: 8 g/dL (ref 6.5–8.1)

## 2019-02-08 LAB — GLUCOSE, CAPILLARY: Glucose-Capillary: 271 mg/dL — ABNORMAL HIGH (ref 70–99)

## 2019-02-08 LAB — HEMOGLOBIN A1C
Hgb A1c MFr Bld: 9.7 % — ABNORMAL HIGH (ref 4.8–5.6)
Mean Plasma Glucose: 231.69 mg/dL

## 2019-02-08 LAB — CBC WITH DIFFERENTIAL/PLATELET
Abs Immature Granulocytes: 0 10*3/uL (ref 0.00–0.07)
Basophils Absolute: 0 10*3/uL (ref 0.0–0.1)
Basophils Relative: 0 %
Eosinophils Absolute: 0 10*3/uL (ref 0.0–0.5)
Eosinophils Relative: 0 %
HCT: 46.2 % (ref 39.0–52.0)
Hemoglobin: 15.4 g/dL (ref 13.0–17.0)
Lymphocytes Relative: 4 %
Lymphs Abs: 0.3 10*3/uL — ABNORMAL LOW (ref 0.7–4.0)
MCH: 28.1 pg (ref 26.0–34.0)
MCHC: 33.3 g/dL (ref 30.0–36.0)
MCV: 84.2 fL (ref 80.0–100.0)
Monocytes Absolute: 0.1 10*3/uL (ref 0.1–1.0)
Monocytes Relative: 1 %
Neutro Abs: 7.7 10*3/uL (ref 1.7–7.7)
Neutrophils Relative %: 95 %
Platelets: 186 10*3/uL (ref 150–400)
RBC: 5.49 MIL/uL (ref 4.22–5.81)
RDW: 14.1 % (ref 11.5–15.5)
WBC: 8.1 10*3/uL (ref 4.0–10.5)
nRBC: 0 % (ref 0.0–0.2)
nRBC: 0 /100 WBC

## 2019-02-08 LAB — PROCALCITONIN: Procalcitonin: 0.25 ng/mL

## 2019-02-08 LAB — ABO/RH: ABO/RH(D): O POS

## 2019-02-08 LAB — D-DIMER, QUANTITATIVE: D-Dimer, Quant: 0.87 ug/mL-FEU — ABNORMAL HIGH (ref 0.00–0.50)

## 2019-02-08 LAB — C-REACTIVE PROTEIN: CRP: 9 mg/dL — ABNORMAL HIGH (ref <1.0)

## 2019-02-08 MED ORDER — GUAIFENESIN-DM 100-10 MG/5ML PO SYRP
5.0000 mL | ORAL_SOLUTION | ORAL | Status: DC | PRN
  Administered 2019-02-08: 5 mL via ORAL
  Filled 2019-02-08: qty 5

## 2019-02-08 MED ORDER — METHYLPREDNISOLONE SODIUM SUCC 125 MG IJ SOLR: 125.0000 mg | Freq: Once | INTRAMUSCULAR | Status: DC | PRN

## 2019-02-08 MED ORDER — SODIUM CHLORIDE 0.9 % IV SOLN: INTRAVENOUS | Status: DC | PRN

## 2019-02-08 MED ORDER — INSULIN ASPART 100 UNIT/ML ~~LOC~~ SOLN: 0.0000 [IU] | Freq: Three times a day (TID) | SUBCUTANEOUS | Status: DC

## 2019-02-08 MED ORDER — ONDANSETRON HCL 4 MG PO TABS: 4.0000 mg | ORAL_TABLET | Freq: Four times a day (QID) | ORAL | Status: DC | PRN

## 2019-02-08 MED ORDER — ALBUTEROL SULFATE HFA 108 (90 BASE) MCG/ACT IN AERS: 2.0000 | INHALATION_SPRAY | Freq: Once | RESPIRATORY_TRACT | Status: DC | PRN

## 2019-02-08 MED ORDER — INSULIN ASPART 100 UNIT/ML ~~LOC~~ SOLN
0.0000 [IU] | Freq: Three times a day (TID) | SUBCUTANEOUS | Status: DC
  Administered 2019-02-09: 7 [IU] via SUBCUTANEOUS
  Administered 2019-02-09: 15 [IU] via SUBCUTANEOUS
  Administered 2019-02-09: 09:00:00 7 [IU] via SUBCUTANEOUS
  Administered 2019-02-10 (×2): 15 [IU] via SUBCUTANEOUS
  Administered 2019-02-10 – 2019-02-11 (×2): 11 [IU] via SUBCUTANEOUS

## 2019-02-08 MED ORDER — ENOXAPARIN SODIUM 40 MG/0.4ML ~~LOC~~ SOLN
40.0000 mg | SUBCUTANEOUS | Status: DC
  Administered 2019-02-08 – 2019-02-10 (×3): 40 mg via SUBCUTANEOUS
  Filled 2019-02-08 (×3): qty 0.4

## 2019-02-08 MED ORDER — INSULIN ASPART 100 UNIT/ML ~~LOC~~ SOLN
0.0000 [IU] | Freq: Every day | SUBCUTANEOUS | Status: DC
  Administered 2019-02-08: 3 [IU] via SUBCUTANEOUS
  Administered 2019-02-09: 4 [IU] via SUBCUTANEOUS
  Administered 2019-02-10: 3 [IU] via SUBCUTANEOUS

## 2019-02-08 MED ORDER — ROSUVASTATIN CALCIUM 20 MG PO TABS
20.0000 mg | ORAL_TABLET | Freq: Every day | ORAL | Status: DC
  Administered 2019-02-09 – 2019-02-11 (×3): 20 mg via ORAL
  Filled 2019-02-08 (×3): qty 1

## 2019-02-08 MED ORDER — BENZONATATE 100 MG PO CAPS: 100.0000 mg | ORAL_CAPSULE | Freq: Two times a day (BID) | ORAL | Status: DC | PRN

## 2019-02-08 MED ORDER — SODIUM CHLORIDE 0.9 % IV SOLN
Freq: Once | INTRAVENOUS | Status: AC
  Filled 2019-02-08: qty 10

## 2019-02-08 MED ORDER — EPINEPHRINE 0.3 MG/0.3ML IJ SOAJ: 0.3000 mg | Freq: Once | INTRAMUSCULAR | Status: DC | PRN

## 2019-02-08 MED ORDER — ONDANSETRON HCL 4 MG/2ML IJ SOLN: 4.0000 mg | Freq: Four times a day (QID) | INTRAMUSCULAR | Status: DC | PRN

## 2019-02-08 MED ORDER — ALBUTEROL SULFATE HFA 108 (90 BASE) MCG/ACT IN AERS
2.0000 | INHALATION_SPRAY | RESPIRATORY_TRACT | Status: DC | PRN
  Filled 2019-02-08: qty 6.7

## 2019-02-08 MED ORDER — FAMOTIDINE IN NACL 20-0.9 MG/50ML-% IV SOLN: 20.0000 mg | Freq: Once | INTRAVENOUS | Status: DC | PRN

## 2019-02-08 MED ORDER — SODIUM CHLORIDE 0.9 % IV SOLN
100.0000 mg | Freq: Every day | INTRAVENOUS | Status: DC
  Administered 2019-02-09 – 2019-02-11 (×3): 100 mg via INTRAVENOUS
  Filled 2019-02-08 (×3): qty 20

## 2019-02-08 MED ORDER — ACETAMINOPHEN 325 MG PO TABS: 650.0000 mg | ORAL_TABLET | Freq: Four times a day (QID) | ORAL | Status: DC | PRN

## 2019-02-08 MED ORDER — INSULIN ASPART 100 UNIT/ML ~~LOC~~ SOLN: 0.0000 [IU] | Freq: Every day | SUBCUTANEOUS | Status: DC

## 2019-02-08 MED ORDER — SODIUM CHLORIDE 0.9 % IV SOLN
200.0000 mg | Freq: Once | INTRAVENOUS | Status: AC
  Administered 2019-02-08: 200 mg via INTRAVENOUS
  Filled 2019-02-08: qty 200

## 2019-02-08 MED ORDER — DEXAMETHASONE 6 MG PO TABS
6.0000 mg | ORAL_TABLET | ORAL | Status: DC
  Administered 2019-02-08 – 2019-02-09 (×2): 6 mg via ORAL
  Filled 2019-02-08 (×2): qty 1

## 2019-02-08 MED ORDER — DIPHENHYDRAMINE HCL 50 MG/ML IJ SOLN: 50.0000 mg | Freq: Once | INTRAMUSCULAR | Status: DC | PRN

## 2019-02-08 NOTE — Progress Notes (Addendum)
  Diagnosis: COVID-19  Physician: Dr. Joya Gaskins  Procedure: [facility] Infusion Clinic Med: casirivimab\imdevimab infusion - Provided patient with casirivimab\imdevimab fact sheet for patients, parents and caregivers prior to infusion.  Complications: No immediate complications noted.  Discharge: Discharged home   Jesse Baldwin 02/08/2019

## 2019-02-08 NOTE — H&P (Signed)
History and Physical    Jesse Baldwin DOB: [birth year] DOA: 02/08/2019  PCP: Rutherford Guys, MD  Patient coming from: Home  I have personally briefly reviewed patient's old medical records in [place]  Chief Complaint: Worsening COVID symptoms  HPI: Jesse Baldwin is a 64 y.o. male with medical history significant of HTN, DM2, HLD.  Patient tested positive for COVID around 1/22 or so.  Saw PCP on 1/27, worsening symptoms of cough, SOB, fever, on 1/29 and 1/30.  Got Regeneron as outpt today.  Not feeling any worse post regeneron compared to pre-regeneron (dont think today's hospitalization is an adverse reaction); however, due to ongoing worsening of symptoms, creat rising to 1.6 on 1/30, increased SOB, uncontrolled DM.  Patient sent in as a direct admit.  CT on 1/29 neg for PE, does show multifocal PNA.  Was put on augmentin a couple of days ago which hasnt really helped any.    Review of Systems: As per HPI, otherwise all review of systems negative.  Past Medical History:  Diagnosis Date  . Diabetes mellitus without complication ([place])   . Hyperlipidemia   . Hypertension     Past Surgical History:  Procedure Laterality Date  . CARPAL TUNNEL RELEASE Bilateral   . CATARACT EXTRACTION Right   . CHOLECYSTECTOMY  1990  . CYST REMOVAL NECK       reports that he quit smoking about 37 years ago. He has never used smokeless tobacco. He reports that he does not drink alcohol or use drugs.  Allergies  Allergen Reactions  . Septra [Bactrim] Nausea And Vomiting  . Sulfa Antibiotics Rash    Family History  Problem Relation Age of Onset  . Diabetes Mother   . Hypertension Mother   . Diabetes Father   . Hypertension Father   . Diabetes Sister   . Hypertension Sister   . Cancer Sister 50       bone marrow cancer  . Diabetes Brother   . Hypertension Brother   . Colon cancer Neg Hx      Prior to Admission medications   Medication Sig Start Date End  Date Taking? Authorizing Provider  acetaminophen (TYLENOL) 500 MG tablet Take 1,000 mg by mouth every 6 (six) hours as needed for moderate pain or fever.    [provider]  amLODipine (NORVASC) 5 MG tablet Take 5 mg by mouth daily. 10/29/18   [provider]  benzonatate (TESSALON) 100 MG capsule Take 1 capsule (100 mg total) by mouth 2 (two) times daily as needed for cough. 02/02/19   Maximiano Coss, NP  guaiFENesin-dextromethorphan (ROBITUSSIN DM) 100-10 MG/5ML syrup Take 5 mLs by mouth every 4 (four) hours as needed for cough. 02/02/19   Maximiano Coss, NP  latanoprost (XALATAN) 0.005 % ophthalmic solution 1 drop at bedtime.    [provider]  losartan (COZAAR) 100 MG tablet Take 1 tablet (100 mg total) by mouth daily. 09/08/18   Rutherford Guys, MD  metFORMIN (GLUCOPHAGE-XR) 500 MG 24 hr tablet Take 2 tablets (1,000 mg total) by mouth 2 (two) times daily with a meal. 12/08/18   Rutherford Guys, MD  repaglinide (PRANDIN) 1 MG tablet TAKE 1 TABLET (1 MG TOTAL) BY MOUTH 3 (THREE) TIMES DAILY BEFORE MEALS. 09/03/18   Rutherford Guys, MD  rosuvastatin (CRESTOR) 20 MG tablet Take 1 tablet (20 mg total) by mouth daily. 03/13/18   Rutherford Guys, MD    Physical Exam: Vitals:   02/08/19  1830  BP: 125/68  Pulse: 99  Resp: 20  Temp: 99.6 F (37.6 C)  TempSrc: Oral  SpO2: 92%  Height: 5\' 2"  (1.575 m)    Constitutional: NAD, calm, comfortable Eyes: PERRL, lids and conjunctivae normal ENMT: Mucous membranes are moist. Posterior pharynx clear of any exudate or lesions.Normal dentition.  Neck: normal, supple, no masses, no thyromegaly Respiratory: clear to auscultation bilaterally, no wheezing, no crackles. Normal respiratory effort. No accessory muscle use.  Cardiovascular: Regular rate and rhythm, no murmurs / rubs / gallops. No extremity edema. 2+ pedal pulses. No carotid bruits.  Abdomen: no tenderness, no masses palpated. No hepatosplenomegaly. Bowel sounds  positive.  Musculoskeletal: no clubbing / cyanosis. No joint deformity upper and lower extremities. Good ROM, no contractures. Normal muscle tone.  Skin: no rashes, lesions, ulcers. No induration Neurologic: CN 2-12 grossly intact. Sensation intact, DTR normal. Strength 5/5 in all 4.  Psychiatric: Normal judgment and insight. Alert and oriented x 3. Normal mood.    Labs on Admission: I have personally reviewed following labs and imaging studies  CBC: Recent Labs  Lab 02/05/19 1702 02/06/19 2234  WBC 8.0 7.4  NEUTROABS 5.8 5.4  HGB 15.9 15.1  HCT 47.9 45.6  MCV 85.8 85.7  PLT 186 [id number]   Basic Metabolic Panel: Recent Labs  Lab 02/05/19 1702 02/06/19 2234  NA 133* 132*  K 4.4 4.2  CL 100 99  CO2 24 23  GLUCOSE 287* 360*  BUN 22 19  CREATININE 1.44* 1.59*  CALCIUM 9.1 8.5*   GFR: Estimated Creatinine Clearance: 39.8 mL/min (A) (by C-G formula based on SCr of 1.59 mg/dL (H)). Liver Function Tests: Recent Labs  Lab 02/05/19 1702 02/06/19 2234  AST 45* 76*  ALT 59* 78*  ALKPHOS 84 83  BILITOT 0.5 1.1  PROT 7.8 7.4  ALBUMIN 3.7 3.4*   No results for input(s): LIPASE, AMYLASE in the last 168 hours. No results for input(s): AMMONIA in the last 168 hours. Coagulation Profile: No results for input(s): INR, PROTIME in the last 168 hours. Cardiac Enzymes: No results for input(s): CKTOTAL, CKMB, CKMBINDEX, TROPONINI in the last 168 hours. BNP (last 3 results) No results for input(s): PROBNP in the last 8760 hours. HbA1C: No results for input(s): HGBA1C in the last 72 hours. CBG: No results for input(s): GLUCAP in the last 168 hours. Lipid Profile: No results for input(s): CHOL, HDL, LDLCALC, TRIG, CHOLHDL, LDLDIRECT in the last 72 hours. Thyroid Function Tests: No results for input(s): TSH, T4TOTAL, FREET4, T3FREE, THYROIDAB in the last 72 hours. Anemia Panel: No results for input(s): VITAMINB12, FOLATE, FERRITIN, TIBC, IRON, RETICCTPCT in the last 72 hours. Urine  analysis:    Component Value Date/Time   APPEARANCEUR Clear 03/12/2017 1642   GLUCOSEU Negative 03/12/2017 1642   BILIRUBINUR negative 03/13/2018 1613   BILIRUBINUR Negative 03/12/2017 1642   KETONESUR negative 03/13/2018 1613   PROTEINUR =100 (A) 03/13/2018 1613   PROTEINUR 2+ (A) 03/12/2017 1642   UROBILINOGEN 1.0 03/13/2018 1613   NITRITE Negative 03/13/2018 1613   NITRITE Negative 03/12/2017 1642   LEUKOCYTESUR Negative 03/13/2018 1613   LEUKOCYTESUR Negative 03/12/2017 1642    Radiological Exams on Admission: No results found.  EKG: Independently reviewed.  Assessment/Plan Principal Problem:   Acute hypoxemic respiratory failure due to COVID-19 [facility]) Active Problems:   Hypertension   Type 2 diabetes mellitus with microalbuminuria, without long-term current use of insulin ([place])    1. COVID-19 - 1. Satting upper 80s / low 90s on RA  currently 2. COVID pathway 3. remdesivir 4. Will start decadron 6mg  Q24H 5. Got regeneron as outpt, doesn't appear to have any adverse reactions at this time. 6. Checking admission: CBC/CMP, CRP, d.dimer, procalcitonin 1. Actemra still TBD depending on above results 7. Check admit CXR 8. Daily labs 9. Cough suppression 10. Tylenol PRN fever 2. DM2 - 1. Resistant SSI AC/HS for now 2. Hold home metformin 3. AKI - 1. Was mild with creat 1.6 as of 2 days ago 2. Repeat CMP pending for today 3. Will hold off on ordering any IVF for the moment until we see what creat is today, dont want to worsen respiratory status in COVID patient 4. Hold ARB 4. HTN - 1. Hold ARB 2. Will also hold norvasc for the moment as BP running low normal  DVT prophylaxis: Lovenox Code Status: Full Family Communication: No family in room Disposition Plan: Home after admit Consults called: None Admission status: Admit to inpatient  Severity of Illness: The appropriate patient status for this patient is INPATIENT. Inpatient status is judged to be reasonable  and necessary in order to provide the required intensity of service to ensure the patient's safety. The patient's presenting symptoms, physical exam findings, and initial radiographic and laboratory data in the context of their chronic comorbidities is felt to place them at high risk for further clinical deterioration. Furthermore, it is not anticipated that the patient will be medically stable for discharge from the hospital within 2 midnights of admission. The following factors support the patient status of inpatient.   IP status due to COVID-19 with new O2 requirement.  * I certify that at the point of admission it is my clinical judgment that the patient will require inpatient hospital care spanning beyond 2 midnights from the point of admission due to high intensity of service, high risk for further deterioration and high frequency of surveillance required.*    Evie Crumpler M. DO [facility]  How to contact the [facility] Attending or Consulting provider Grandfather or covering provider during after hours [place], for this patient?  1. Check the care team in [facility] and look for a) attending/consulting TR[facility] provider listed and b) the [facility] team listed 2. Log into [email or web address]  Amion Physician Scheduling and messaging for groups and whole hospitals  On call and physician scheduling software for group practices, residents, hospitalists and other medical providers for call, clinic, rotation and shift schedules. On[employer] is a hospital-wide system for scheduling doctors and paging doctors on call. EasyPlot is for scientific plotting and data analysis.  [email or web address]  and use [place]'s universal password to access. If you do not have the password, please contact the hospital operator.  3. Locate the [facility] provider you are looking for under [facility] and page to a number that you can be directly reached. 4. If you still have difficulty reaching the provider, please page the [facility] (Director  on Call) for the Hospitalists listed on amion for assistance.  02/08/2019, 7:46 PM

## 2019-02-08 NOTE — Discharge Instructions (Signed)

## 2019-02-08 NOTE — Progress Notes (Signed)
Nurse attempted to start PIV x2, no access able to be obtained. Iv team consulted.

## 2019-02-09 LAB — CBC WITH DIFFERENTIAL/PLATELET
Abs Immature Granulocytes: 0.05 10*3/uL (ref 0.00–0.07)
Basophils Absolute: 0 10*3/uL (ref 0.0–0.1)
Basophils Relative: 0 %
Eosinophils Absolute: 0 10*3/uL (ref 0.0–0.5)
Eosinophils Relative: 0 %
HCT: 42.6 % (ref 39.0–52.0)
Hemoglobin: 14.3 g/dL (ref 13.0–17.0)
Immature Granulocytes: 1 %
Lymphocytes Relative: 12 %
Lymphs Abs: 0.9 10*3/uL (ref 0.7–4.0)
MCH: 28.7 pg (ref 26.0–34.0)
MCHC: 33.6 g/dL (ref 30.0–36.0)
MCV: 85.4 fL (ref 80.0–100.0)
Monocytes Absolute: 0.1 10*3/uL (ref 0.1–1.0)
Monocytes Relative: 2 %
Neutro Abs: 6.2 10*3/uL (ref 1.7–7.7)
Neutrophils Relative %: 85 %
Platelets: 188 10*3/uL (ref 150–400)
RBC: 4.99 MIL/uL (ref 4.22–5.81)
RDW: 14.3 % (ref 11.5–15.5)
WBC: 7.3 10*3/uL (ref 4.0–10.5)
nRBC: 0 % (ref 0.0–0.2)

## 2019-02-09 LAB — COMPREHENSIVE METABOLIC PANEL
ALT: 120 U/L — ABNORMAL HIGH (ref 0–44)
AST: 89 U/L — ABNORMAL HIGH (ref 15–41)
Albumin: 2.7 g/dL — ABNORMAL LOW (ref 3.5–5.0)
Alkaline Phosphatase: 97 U/L (ref 38–126)
Anion gap: 11 (ref 5–15)
BUN: 23 mg/dL (ref 8–23)
CO2: 24 mmol/L (ref 22–32)
Calcium: 8.7 mg/dL — ABNORMAL LOW (ref 8.9–10.3)
Chloride: 99 mmol/L (ref 98–111)
Creatinine, Ser: 1.34 mg/dL — ABNORMAL HIGH (ref 0.61–1.24)
GFR calc Af Amer: >60 mL/min (ref >60)
GFR calc non Af Amer: 55 mL/min — ABNORMAL LOW (ref >60)
Glucose, Bld: 302 mg/dL — ABNORMAL HIGH (ref 70–99)
Potassium: 4.7 mmol/L (ref 3.5–5.1)
Sodium: 134 mmol/L — ABNORMAL LOW (ref 135–145)
Total Bilirubin: 0.7 mg/dL (ref 0.3–1.2)
Total Protein: 7.2 g/dL (ref 6.5–8.1)

## 2019-02-09 LAB — GLUCOSE, CAPILLARY
Glucose-Capillary: 248 mg/dL — ABNORMAL HIGH (ref 70–99)
Glucose-Capillary: 322 mg/dL — ABNORMAL HIGH (ref 70–99)
Glucose-Capillary: 250 mg/dL — ABNORMAL HIGH (ref 70–99)
Glucose-Capillary: 315 mg/dL — ABNORMAL HIGH (ref 70–99)

## 2019-02-09 LAB — HIV ANTIBODY (ROUTINE TESTING W REFLEX): HIV Screen 4th Generation wRfx: NONREACTIVE — AB

## 2019-02-09 LAB — D-DIMER, QUANTITATIVE: D-Dimer, Quant: 0.74 ug/mL-FEU — ABNORMAL HIGH (ref 0.00–0.50)

## 2019-02-09 LAB — C-REACTIVE PROTEIN: CRP: 8.6 mg/dL — ABNORMAL HIGH (ref <1.0)

## 2019-02-09 MED ORDER — BENZONATATE 100 MG PO CAPS: 200.0000 mg | ORAL_CAPSULE | Freq: Three times a day (TID) | ORAL | Status: DC | PRN

## 2019-02-09 MED ORDER — GUAIFENESIN-DM 100-10 MG/5ML PO SYRP: 10.0000 mL | ORAL_SOLUTION | ORAL | Status: DC | PRN

## 2019-02-09 NOTE — Progress Notes (Signed)
Patient ID: Knute Mela, male   DOB: February 12, 1955, 64 y.o.   MRN: [id number]  PROGRESS NOTE    Mali Juaire  [id number] DOB: 11-Oct-1955 DOA: 02/08/2019 PCP: Rutherford Guys, MD   Brief Narrative:  64 year old male with past medical history of hypertension, diabetes mellitus type 2, hypertension who tested positive for Covid around 01/29/2019 with worsening cough and fever for the last few days, received Regeneron as an outpatient on 02/08/2019 presented with worsening shortness of breath and was sent in as a direct admit.  CT on 02/05/2019 was negative for PE but showed multifocal pneumonia; he was on Augmentin a few days ago which did not help really.  He was started on Decadron and remdesivir.  Assessment & Plan:   COVID-19 multifocal pneumonia -Tested positive for COVID-19 on 01/29/2019.  CT 02/05/2019 was negative for PE but showed multifocal pneumonia.  He received Regeneron as an outpatient on 02/08/2019 but continued to have worsening shortness of breath and was sent in as direct admit. He was on Augmentin a few days ago which did not help really -Currently on room air COVID-19 Labs  Recent Labs    02/08/19 2049 02/09/19 0330  DDIMER 0.87* 0.74*  CRP 9.0* 8.6*    No results found for: SARSCOV2NAA -We will monitor inflammatory markers.  Currently elevated but improving. -Continue Decadron and remdesivir.  Will hold off on Actemra for now; currently on room air.  If starts becoming hypoxic, might consider Actemra.  Diabetes mellitus type 2 uncontrolled with hyperglycemia -Home Metformin on hold.  A1c 9.7.  Continue CBGs with SSI.  Might have to start long-acting insulin  Acute kidney injury -Mild.  Creatinine 1.59 a few days ago.  1.34 today.  Improving.  Monitor.  ARB on hold.  Hypertension -Blood pressure stable.  Norvasc and ARB on hold    DVT prophylaxis: Lovenox Code Status: Full Family Communication: Spoke to patient at bedside Disposition Plan: We will remain  inpatient for now to continue IV remdesivir and monitoring inflammatory markers.  If no evidence of hypoxia in the next few days, might consider discharging home.  Consultants: None  Procedures: None  Antimicrobials:  Anti-infectives (From admission, onward)   Start     Dose/Rate Route Frequency Ordered Stop   02/09/19 1000  remdesivir 100 mg in sodium chloride 0.9 % 100 mL IVPB     100 mg 200 mL/hr over 30 Minutes Intravenous Daily 02/08/19 1923 02/13/19 0959   02/08/19 2000  remdesivir 200 mg in sodium chloride 0.9% 250 mL IVPB     200 mg 580 mL/hr over 30 Minutes Intravenous Once 02/08/19 1923 02/08/19 2310       Subjective: Patient seen and examined at bedside.  He feels slightly better but still feels short of breath with exertion.  No overnight fever, vomiting, nausea or chest pain.  Objective: Vitals:   02/08/19 1830 02/08/19 2030 02/09/19 0549  BP: 125/68 117/80 114/71  Pulse: 99 95 87  Resp: 20 20   Temp: 99.6 F (37.6 C) 98.6 F (37 C) 98.5 F (36.9 C)  TempSrc: Oral Oral Oral  SpO2: 92% 97% 92%  Weight:   68 kg  Height: 5\' 2"  (1.575 m)     No intake or output data in the 24 hours ending 02/09/19 0746 Filed Weights   02/09/19 0549  Weight: 68 kg    Examination:  General exam: Appears calm and comfortable. Respiratory system: Bilateral decreased breath sounds at bases with some scattered crackles Cardiovascular system: S1 &  S2 heard, Rate controlled Gastrointestinal system: Abdomen is nondistended, soft and nontender. Normal bowel sounds heard. Extremities: No cyanosis, clubbing, edema  Central nervous system: Alert and oriented. No focal neurological deficits. Moving extremities Skin: No rashes, lesions or ulcers Psychiatry: Judgement and insight appear normal. Mood & affect appropriate.     Data Reviewed: I have personally reviewed following labs and imaging studies  CBC: Recent Labs  Lab 02/05/19 1702 02/06/19 2234 02/08/19 2049  02/09/19 0330  WBC 8.0 7.4 8.1 7.3  NEUTROABS 5.8 5.4 7.7 6.2  HGB 15.9 15.1 15.4 14.3  HCT 47.9 45.6 46.2 42.6  MCV 85.8 85.7 84.2 85.4  PLT 186 172 186 0000000   Basic Metabolic Panel: Recent Labs  Lab 02/05/19 1702 02/06/19 2234 02/08/19 2049 02/09/19 0330  NA 133* 132* 134* 134*  K 4.4 4.2 4.7 4.7  CL 100 99 99 99  CO2 24 23 24 24   GLUCOSE 287* 360* 323* 302*  BUN 22 19 18 23   CREATININE 1.44* 1.59* 1.52* 1.34*  CALCIUM 9.1 8.5* 8.9 8.7*   GFR: Estimated Creatinine Clearance: 46.6 mL/min (A) (by C-G formula based on SCr of 1.34 mg/dL (H)). Liver Function Tests: Recent Labs  Lab 02/05/19 1702 02/06/19 2234 02/08/19 2049 02/09/19 0330  AST 45* 76* 125* 89*  ALT 59* 78* 146* 120*  ALKPHOS 84 83 108 97  BILITOT 0.5 1.1 0.8 0.7  PROT 7.8 7.4 8.0 7.2  ALBUMIN 3.7 3.4* 3.1* 2.7*   No results for input(s): LIPASE, AMYLASE in the last 168 hours. No results for input(s): AMMONIA in the last 168 hours. Coagulation Profile: No results for input(s): INR, PROTIME in the last 168 hours. Cardiac Enzymes: No results for input(s): CKTOTAL, CKMB, CKMBINDEX, TROPONINI in the last 168 hours. BNP (last 3 results) No results for input(s): PROBNP in the last 8760 hours. HbA1C: Recent Labs    02/08/19 2049  HGBA1C 9.7*   CBG: Recent Labs  Lab 02/08/19 2031 02/09/19 0737  GLUCAP 271* 248*   Lipid Profile: No results for input(s): CHOL, HDL, LDLCALC, TRIG, CHOLHDL, LDLDIRECT in the last 72 hours. Thyroid Function Tests: No results for input(s): TSH, T4TOTAL, FREET4, T3FREE, THYROIDAB in the last 72 hours. Anemia Panel: No results for input(s): VITAMINB12, FOLATE, FERRITIN, TIBC, IRON, RETICCTPCT in the last 72 hours. Sepsis Labs: Recent Labs  Lab 02/08/19 2049  PROCALCITON 0.25    No results found for this or any previous visit (from the past 240 hour(s)).       Radiology Studies: DG CHEST PORT 1 VIEW  Result Date: 02/08/2019 CLINICAL DATA:  Shortness of  breath. EXAM: PORTABLE CHEST 1 VIEW COMPARISON:  February 05, 2019 FINDINGS: Small patchy infiltrates are seen throughout both lungs. There is no evidence of a pleural effusion or pneumothorax. The heart size and mediastinal contours are within normal limits. The visualized skeletal structures are unremarkable. Radiopaque surgical clips are seen overlying the right upper quadrant. IMPRESSION: 1. Mild to moderate severity diffuse bilateral infiltrates. Electronically Signed   By: Virgina Norfolk M.D.   On: 02/08/2019 20:15        Scheduled Meds: . dexamethasone  6 mg Oral Q24H  . enoxaparin (LOVENOX) injection  40 mg Subcutaneous Q24H  . insulin aspart  0-20 Units Subcutaneous TID WC  . insulin aspart  0-5 Units Subcutaneous QHS  . rosuvastatin  20 mg Oral Daily   Continuous Infusions: . remdesivir 100 mg in NS 100 mL  Aline August, MD [facility] 02/09/2019, 7:46 AM

## 2019-02-09 NOTE — Progress Notes (Signed)
Inpatient Diabetes Program Recommendations  AACE/ADA: New Consensus Statement on Inpatient Glycemic Control (2015)  Target Ranges:  Prepandial:   less than 140 mg/dL      Peak postprandial:   less than 180 mg/dL (1-2 hours)      Critically ill patients:  140 - 180 mg/dL   Results for TYE, BRENNEMAN (MRN [id number]) as of 02/09/2019 16:52  Ref. Range 02/09/2019 07:37 02/09/2019 12:20 02/09/2019 16:34  Glucose-Capillary Latest Ref Range: 70 - 99 mg/dL 248 (H)  7 units NOVOLOG  322 (H)  15 units NOVOLOG  250 (H)     Home DM Meds: Metformin XR 1000 mg BID       Prandin 1 mg TID    Current: Novolog Resistant Correction Scale/ SSI (0-20 units) TID AC + HS    PCP:Dr. Pamella Pert with [facility]--last seen 12/08/2018--No chnages made to regimen since A1c was 7.7%     Getting Decadron 6 mg Daily.   Novolog started yest at bedtime.   Current A1c= 9.7%--was 7.7% back in November    MD- Please consider adding Levemir 13 units Daily (0.2 units/kg)    --Will follow patient during hospitalization--  Wyn Quaker RN, MSN, CDE Diabetes Coordinator Inpatient Glycemic Control Team Team Pager: [phone] (8a-5p)

## 2019-02-09 NOTE — Plan of Care (Signed)
  Problem: Education: Goal: Knowledge of risk factors and measures for prevention of condition will improve Outcome: Progressing   Problem: Coping: Goal: Psychosocial and spiritual needs will be supported Outcome: Progressing   Problem: Respiratory: Goal: Will maintain a patent airway Outcome: Progressing Goal: Complications related to the disease process, condition or treatment will be avoided or minimized Outcome: Progressing   

## 2019-02-10 LAB — CBC WITH DIFFERENTIAL/PLATELET
Abs Immature Granulocytes: 0 10*3/uL (ref 0.00–0.07)
Basophils Absolute: 0 10*3/uL (ref 0.0–0.1)
Basophils Relative: 0 %
Eosinophils Absolute: 0 10*3/uL (ref 0.0–0.5)
Eosinophils Relative: 0 %
HCT: 44.8 % (ref 39.0–52.0)
Hemoglobin: 15.1 g/dL (ref 13.0–17.0)
Lymphocytes Relative: 5 %
Lymphs Abs: 0.5 10*3/uL — ABNORMAL LOW (ref 0.7–4.0)
MCH: 28.4 pg (ref 26.0–34.0)
MCHC: 33.7 g/dL (ref 30.0–36.0)
MCV: 84.2 fL (ref 80.0–100.0)
Monocytes Absolute: 0.4 10*3/uL (ref 0.1–1.0)
Monocytes Relative: 4 %
Neutro Abs: 9.9 10*3/uL — ABNORMAL HIGH (ref 1.7–7.7)
Neutrophils Relative %: 91 %
Platelets: 226 10*3/uL (ref 150–400)
RBC: 5.32 MIL/uL (ref 4.22–5.81)
RDW: 14.4 % (ref 11.5–15.5)
WBC: 10.9 10*3/uL — ABNORMAL HIGH (ref 4.0–10.5)
nRBC: 0 % (ref 0.0–0.2)
nRBC: 0 /100 WBC

## 2019-02-10 LAB — COMPREHENSIVE METABOLIC PANEL
ALT: 145 U/L — ABNORMAL HIGH (ref 0–44)
AST: 99 U/L — ABNORMAL HIGH (ref 15–41)
Albumin: 3.1 g/dL — ABNORMAL LOW (ref 3.5–5.0)
Alkaline Phosphatase: 93 U/L (ref 38–126)
Anion gap: 15 (ref 5–15)
BUN: 30 mg/dL — ABNORMAL HIGH (ref 8–23)
CO2: 23 mmol/L (ref 22–32)
Calcium: 9 mg/dL (ref 8.9–10.3)
Chloride: 100 mmol/L (ref 98–111)
Creatinine, Ser: 1.21 mg/dL (ref 0.61–1.24)
GFR calc Af Amer: >60 mL/min (ref >60)
GFR calc non Af Amer: >60 mL/min (ref >60)
Glucose, Bld: 275 mg/dL — ABNORMAL HIGH (ref 70–99)
Potassium: 4.5 mmol/L (ref 3.5–5.1)
Sodium: 138 mmol/L (ref 135–145)
Total Bilirubin: 1 mg/dL (ref 0.3–1.2)
Total Protein: 7.7 g/dL (ref 6.5–8.1)

## 2019-02-10 LAB — GLUCOSE, CAPILLARY
Glucose-Capillary: 259 mg/dL — ABNORMAL HIGH (ref 70–99)
Glucose-Capillary: 337 mg/dL — ABNORMAL HIGH (ref 70–99)
Glucose-Capillary: 335 mg/dL — ABNORMAL HIGH (ref 70–99)
Glucose-Capillary: 291 mg/dL — ABNORMAL HIGH (ref 70–99)
Glucose-Capillary: 273 mg/dL — ABNORMAL HIGH (ref 70–99)

## 2019-02-10 LAB — MAGNESIUM: Magnesium: 2.3 mg/dL (ref 1.7–2.4)

## 2019-02-10 LAB — FERRITIN: Ferritin: 1359 ng/mL — ABNORMAL HIGH (ref 24–336)

## 2019-02-10 LAB — D-DIMER, QUANTITATIVE: D-Dimer, Quant: 0.61 ug/mL-FEU — ABNORMAL HIGH (ref 0.00–0.50)

## 2019-02-10 LAB — C-REACTIVE PROTEIN: CRP: 4.8 mg/dL — ABNORMAL HIGH (ref <1.0)

## 2019-02-10 MED ORDER — INSULIN GLARGINE 100 UNIT/ML ~~LOC~~ SOLN
20.0000 [IU] | Freq: Every day | SUBCUTANEOUS | Status: DC
  Administered 2019-02-10 – 2019-02-11 (×2): 20 [IU] via SUBCUTANEOUS
  Filled 2019-02-10 (×2): qty 0.2

## 2019-02-10 MED ORDER — LIVING WELL WITH DIABETES BOOK - IN SPANISH
Freq: Once | Status: AC
  Filled 2019-02-10: qty 1

## 2019-02-10 MED ORDER — INSULIN STARTER KIT- PEN NEEDLES (SPANISH)
1.0000 | Freq: Once | Status: AC
  Administered 2019-02-10: 1
  Filled 2019-02-10: qty 1

## 2019-02-10 MED ORDER — DEXAMETHASONE 4 MG PO TABS
4.0000 mg | ORAL_TABLET | ORAL | Status: DC
  Administered 2019-02-10: 4 mg via ORAL
  Filled 2019-02-10: qty 1

## 2019-02-10 NOTE — Progress Notes (Addendum)
Inpatient Diabetes Program Recommendations  AACE/ADA: New Consensus Statement on Inpatient Glycemic Control (2015)  Target Ranges:  Prepandial:   less than 140 mg/dL      Peak postprandial:   less than 180 mg/dL (1-2 hours)      Critically ill patients:  140 - 180 mg/dL   Lab Results  Component Value Date   GLUCAP 337 (H) 02/10/2019   HGBA1C 9.7 (H) 02/08/2019    Review of Glycemic Control Results for Jesse Baldwin, Jesse Baldwin (MRN [id number]) as of 02/10/2019 10:52  Ref. Range 02/09/2019 16:34 02/09/2019 21:06 02/10/2019 05:16 02/10/2019 08:59  Glucose-Capillary Latest Ref Range: 70 - 99 mg/dL 250 (H) 315 (H) 259 (H) 337 (H)   Home DM Meds: Metformin XR 1000 mg BID                             Prandin 1 mg TID   Current: Novolog Resistant Correction Scale/ SSI (0-20 units) TID AC + HS  PCP:Dr. Pamella Pert with [facility]--last seen 12/08/2018--No changes made to regimen since A1c was 7.7%   Getting Decadron 6 mg Daily.   Novolog started yest at bedtime.   Current A1c= 9.7%--was 7.7% back in November   MD- Please consider adding Levemir 13 units Daily (0.2 units/kg)  Addendum: Spoke with patient regarding outpatient diabetes management. Patient states, "I was doing well before I came in to the hospital, now I am on insulin and my blood sugars are still high." Reviewed patient's current A1c of 9.7% up from 7.7%. Explained what a A1c is and what it measures. Also reviewed goal A1c with patient, importance of good glucose control @ home, and blood sugar goals. Reviewed in detail patho of Dm, role of pancreas, need for insulin, impact of steroids and infection on glucose trends, vascular changes and commodities. Patient has a meter and was using once per day in the mornings with an average blood sugar of 100-130 mg/dL. Encouraged that at discharge he will need to increase the frequency of his checking to 2-3 times per day. Reviewed target goals, survival skills, interventions, signs and  symptoms of hypo vs hyper glycemia, and when to call MD.  During the conversation patient becoming more accepting of using insulin at discharge, however, adamant against multiple daily injections.  Reviewed being mindful of carbohydrate intake, increasing vegetables, portion control, plate method, reading nutritional labels and allotement of carbohydrates per day. Patient states, "I will plan to become more motivated and change my eating habits." Rn to begin working with patient on self injections in preparation for discharge. Will also place dietitian consult and order LWWDM booklet to help reinforce. Patient has no further questions at this time.   Thanks, Bronson Curb, MSN, RNC-OB Diabetes Coordinator [phone] (8a-5p)

## 2019-02-10 NOTE — Progress Notes (Signed)
Patient ID: Jesse Baldwin, male   DOB: [birth year], 64 y.o.   MRN: [id number]  PROGRESS NOTE    Jyaire Relyea  [id number] DOB: 1955-12-13 DOA: 02/08/2019 PCP: Rutherford Guys, MD   Brief Narrative:  64 year old male with past medical history of hypertension, diabetes mellitus type 2, hypertension who tested positive for Covid around 01/29/2019 with worsening cough and fever for the last few days, received Regeneron as an outpatient on 02/08/2019 presented with worsening shortness of breath and was sent in as a direct admit.  CT on 02/05/2019 was negative for PE but showed multifocal pneumonia; he was on Augmentin a few days ago which did not help really.  He was started on Decadron and remdesivir.   Subjective:  Patient seen and examined at bedside.  He feels slightly better but still feels short of breath with exertion.  No overnight fever, vomiting, nausea or chest pain.   Assessment & Plan:   COVID-19 multifocal pneumonia -Tested positive for COVID-19 on 01/29/2019.  CT 02/05/2019 was negative for PE but showed multifocal pneumonia.  He received Regeneron as an outpatient on 02/08/2019, he currently seems to have mild to moderate disease at best, currently stable on room air, continue steroids and remdesivir.  If remains relatively symptom-free will try and discharge him with outpatient remdesivir infusion for his last few doses.   COVID-19 Labs  Recent Labs    02/08/19 2049 02/09/19 0330 02/10/19 0518  DDIMER 0.87* 0.74* 0.61*  FERRITIN  --   --  1,359*  CRP 9.0* 8.6* 4.8*    No results found for: SARSCOV2NAA -We will monitor inflammatory markers.  Currently elevated but improving. -Continue Decadron and remdesivir.  Will hold off on Actemra for now; currently on room air.  If starts becoming hypoxic, might consider Actemra.  Diabetes mellitus type 2 uncontrolled with hyperglycemia -Home Metformin on hold.  A1c 9.7.  Poor control outpatient due to hyperglycemia, currently on  Lantus and sliding scale, diabetes and insulin education provided this admission.  Patient seems to be in denial with his disease and does not wish to take proper medications at home.  Acute kidney injury -Mild.  Creatinine 1.59 a few days ago.  1.34 today.  Improving.  Monitor.  ARB on hold.  Hypertension -Blood pressure stable.  Norvasc and ARB on hold    DVT prophylaxis: Lovenox Code Status: Full Family Communication: Spoke to patient at bedside Disposition Plan: Discharge home in 1 to 2 days if COVID-19 pneumonia remains stable.  Currently on IV remdesivir.  Consultants: None  Procedures: None  Antimicrobials:  Anti-infectives (From admission, onward)   Start     Dose/Rate Route Frequency Ordered Stop   02/09/19 1000  remdesivir 100 mg in sodium chloride 0.9 % 100 mL IVPB     100 mg 200 mL/hr over 30 Minutes Intravenous Daily 02/08/19 1923 02/13/19 0959   02/08/19 2000  remdesivir 200 mg in sodium chloride 0.9% 250 mL IVPB     200 mg 580 mL/hr over 30 Minutes Intravenous Once 02/08/19 1923 02/08/19 2310      Objective: Vitals:   02/09/19 0830 02/09/19 2142 02/09/19 2335 02/10/19 1018  BP:   114/71 133/80  Pulse:  82 73 94  Resp:  16 18 18   Temp:  98.7 F (37.1 C) 99.1 F (37.3 C) 97.6 F (36.4 C)  TempSrc:  Oral Oral Oral  SpO2: 94% 93% 92% 94%  Weight:      Height:        Intake/Output  Summary (Last 24 hours) at 02/10/2019 1323 Last data filed at 02/09/2019 1500 Gross per 24 hour  Intake 100 ml  Output --  Net 100 ml   Filed Weights   02/09/19 0549  Weight: 68 kg    Examination:  Awake Alert,   No new F.N deficits, Normal affect [place].AT,PERRAL Supple Neck,No JVD, No cervical lymphadenopathy appriciated.  Symmetrical Chest wall movement, Good air movement bilaterally, CTAB RRR,No Gallops, Rubs or new Murmurs, No Parasternal Heave +ve B.Sounds, Abd Soft, No tenderness, No organomegaly appriciated, No rebound - guarding or rigidity. No Cyanosis, Clubbing  or edema, No new Rash or bruise   Data Reviewed: I have personally reviewed following labs and imaging studies  CBC: Recent Labs  Lab 02/05/19 1702 02/06/19 2234 02/08/19 2049 02/09/19 0330 02/10/19 0518  WBC 8.0 7.4 8.1 7.3 10.9*  NEUTROABS 5.8 5.4 7.7 6.2 9.9*  HGB 15.9 15.1 15.4 14.3 15.1  HCT 47.9 45.6 46.2 42.6 44.8  MCV 85.8 85.7 84.2 85.4 84.2  PLT 186 172 186 188 [id number]   Basic Metabolic Panel: Recent Labs  Lab 02/05/19 1702 02/06/19 2234 02/08/19 2049 02/09/19 0330 02/10/19 0518  NA 133* 132* 134* 134* 138  K 4.4 4.2 4.7 4.7 4.5  CL 100 99 99 99 100  CO2 24 23 24 24 23   GLUCOSE 287* 360* 323* 302* 275*  BUN 22 19 18 23  30*  CREATININE 1.44* 1.59* 1.52* 1.34* 1.21  CALCIUM 9.1 8.5* 8.9 8.7* 9.0  MG  --   --   --   --  2.3   GFR: Estimated Creatinine Clearance: 51.7 mL/min (by C-G formula based on SCr of 1.21 mg/dL). Liver Function Tests: Recent Labs  Lab 02/05/19 1702 02/06/19 2234 02/08/19 2049 02/09/19 0330 02/10/19 0518  AST 45* 76* 125* 89* 99*  ALT 59* 78* 146* 120* 145*  ALKPHOS 84 83 108 97 93  BILITOT 0.5 1.1 0.8 0.7 1.0  PROT 7.8 7.4 8.0 7.2 7.7  ALBUMIN 3.7 3.4* 3.1* 2.7* 3.1*   No results for input(s): LIPASE, AMYLASE in the last 168 hours. No results for input(s): AMMONIA in the last 168 hours. Coagulation Profile: No results for input(s): INR, PROTIME in the last 168 hours. Cardiac Enzymes: No results for input(s): CKTOTAL, CKMB, CKMBINDEX, TROPONINI in the last 168 hours. BNP (last 3 results) No results for input(s): PROBNP in the last 8760 hours. HbA1C: Recent Labs    02/08/19 2049  HGBA1C 9.7*   CBG: Recent Labs  Lab 02/09/19 1634 02/09/19 2106 02/10/19 0516 02/10/19 0859 02/10/19 1253  GLUCAP 250* 315* 259* 337* 335*   Lipid Profile: No results for input(s): CHOL, HDL, LDLCALC, TRIG, CHOLHDL, LDLDIRECT in the last 72 hours. Thyroid Function Tests: No results for input(s): TSH, T4TOTAL, FREET4, T3FREE, THYROIDAB in  the last 72 hours. Anemia Panel: Recent Labs    02/10/19 0518  FERRITIN 1,359*   Sepsis Labs: Recent Labs  Lab 02/08/19 2049  PROCALCITON 0.25    No results found for this or any previous visit (from the past 240 hour(s)).       Radiology Studies: DG CHEST PORT 1 VIEW  Result Date: 02/08/2019 CLINICAL DATA:  Shortness of breath. EXAM: PORTABLE CHEST 1 VIEW COMPARISON:  February 05, 2019 FINDINGS: Small patchy infiltrates are seen throughout both lungs. There is no evidence of a pleural effusion or pneumothorax. The heart size and mediastinal contours are within normal limits. The visualized skeletal structures are unremarkable. Radiopaque surgical clips are seen overlying the right  upper quadrant. IMPRESSION: 1. Mild to moderate severity diffuse bilateral infiltrates. Electronically Signed   By: Virgina Norfolk M.D.   On: 02/08/2019 20:15   Scheduled Meds: . dexamethasone  4 mg Oral Q24H  . enoxaparin (LOVENOX) injection  40 mg Subcutaneous Q24H  . insulin aspart  0-20 Units Subcutaneous TID WC  . insulin aspart  0-5 Units Subcutaneous QHS  . insulin glargine  20 Units Subcutaneous Daily  . rosuvastatin  20 mg Oral Daily   Continuous Infusions: . remdesivir 100 mg in NS 100 mL 100 mg (02/10/19 1018)    Lala Lund, MD [facility] 02/10/2019, 1:23 PM

## 2019-02-11 LAB — COMPREHENSIVE METABOLIC PANEL
ALT: 136 U/L — ABNORMAL HIGH (ref 0–44)
AST: 76 U/L — ABNORMAL HIGH (ref 15–41)
Albumin: 2.8 g/dL — ABNORMAL LOW (ref 3.5–5.0)
Alkaline Phosphatase: 87 U/L (ref 38–126)
Anion gap: 8 (ref 5–15)
BUN: 34 mg/dL — ABNORMAL HIGH (ref 8–23)
CO2: 23 mmol/L (ref 22–32)
Calcium: 8.7 mg/dL — ABNORMAL LOW (ref 8.9–10.3)
Chloride: 105 mmol/L (ref 98–111)
Creatinine, Ser: 1.26 mg/dL — ABNORMAL HIGH (ref 0.61–1.24)
GFR calc Af Amer: >60 mL/min (ref >60)
GFR calc non Af Amer: 59 mL/min — ABNORMAL LOW (ref >60)
Glucose, Bld: 233 mg/dL — ABNORMAL HIGH (ref 70–99)
Potassium: 4.2 mmol/L (ref 3.5–5.1)
Sodium: 136 mmol/L (ref 135–145)
Total Bilirubin: 0.9 mg/dL (ref 0.3–1.2)
Total Protein: 6.7 g/dL (ref 6.5–8.1)

## 2019-02-11 LAB — CBC WITH DIFFERENTIAL/PLATELET
Abs Immature Granulocytes: 0.07 10*3/uL (ref 0.00–0.07)
Basophils Absolute: 0 10*3/uL (ref 0.0–0.1)
Basophils Relative: 0 %
Eosinophils Absolute: 0 10*3/uL (ref 0.0–0.5)
Eosinophils Relative: 0 %
HCT: 40.3 % (ref 39.0–52.0)
Hemoglobin: 13.8 g/dL (ref 13.0–17.0)
Immature Granulocytes: 1 %
Lymphocytes Relative: 8 %
Lymphs Abs: 1 10*3/uL (ref 0.7–4.0)
MCH: 28.5 pg (ref 26.0–34.0)
MCHC: 34.2 g/dL (ref 30.0–36.0)
MCV: 83.1 fL (ref 80.0–100.0)
Monocytes Absolute: 0.2 10*3/uL (ref 0.1–1.0)
Monocytes Relative: 2 %
Neutro Abs: 11.6 10*3/uL — ABNORMAL HIGH (ref 1.7–7.7)
Neutrophils Relative %: 89 %
Platelets: 243 10*3/uL (ref 150–400)
RBC: 4.85 MIL/uL (ref 4.22–5.81)
RDW: 14.3 % (ref 11.5–15.5)
WBC: 12.7 10*3/uL — ABNORMAL HIGH (ref 4.0–10.5)
nRBC: 0 % (ref 0.0–0.2)

## 2019-02-11 LAB — BRAIN NATRIURETIC PEPTIDE: B Natriuretic Peptide: 55.9 pg/mL (ref 0.0–100.0)

## 2019-02-11 LAB — C-REACTIVE PROTEIN: CRP: 2.9 mg/dL — ABNORMAL HIGH (ref <1.0)

## 2019-02-11 LAB — MAGNESIUM: Magnesium: 2.5 mg/dL — ABNORMAL HIGH (ref 1.7–2.4)

## 2019-02-11 LAB — D-DIMER, QUANTITATIVE: D-Dimer, Quant: 0.32 ug/mL-FEU (ref 0.00–0.50)

## 2019-02-11 LAB — GLUCOSE, CAPILLARY: Glucose-Capillary: 257 mg/dL — ABNORMAL HIGH (ref 70–99)

## 2019-02-11 MED ORDER — DEXAMETHASONE 2 MG PO TABS
2.0000 mg | ORAL_TABLET | ORAL | Status: DC
  Filled 2019-02-11: qty 1

## 2019-02-11 NOTE — Care Management (Signed)
Pt deemed stable for discharge home today.  CM reviewed chart for TOC needs - none determined.  No outstanding TOC consults/orders - CM signing off

## 2019-02-11 NOTE — Discharge Instructions (Addendum)
You are scheduled for an outpatient infusion of Remdesivir at 11:30 AM on Friday 2/5.  Please report to [facility] at [address]  Drive to the security guard and tell them you are here for an infusion. They will direct you to the front entrance where we will come and get you.  For questions call [phone].    Follow with Primary MD Rutherford Guys, MD in 7 days   Get CBC, CMP, 2 view Chest X ray -  checked next visit within 1 week by Primary MD   Activity: As tolerated with Full fall precautions use walker/cane & assistance as needed  Disposition Home   Diet: Heart Healthy Low Carb  Accuchecks 4 times/day, Once in AM empty stomach and then before each meal. Log in all results and show them to your Prim.MD in 3 days. If any glucose reading is under 80 or above 300 call your Prim MD immidiately. Follow Low glucose instructions for glucose under 80 as instructed.   Special Instructions: If you have smoked or chewed Tobacco  in the last 2 yrs please stop smoking, stop any regular Alcohol  and or any Recreational drug use.  On your next visit with your primary care physician please Get Medicines reviewed and adjusted.  Please request your Prim.MD to go over all Hospital Tests and Procedure/Radiological results at the follow up, please get all Hospital records sent to your Prim MD by signing hospital release before you go home.  If you experience worsening of your admission symptoms, develop shortness of breath, life threatening emergency, suicidal or homicidal thoughts you must seek medical attention immediately by calling 911 or calling your MD immediately  if symptoms less severe.  You Must read complete instructions/literature along with all the possible adverse reactions/side effects for all the Medicines you take and that have been prescribed to you. Take any new Medicines after you have completely understood and accpet all the possible adverse reactions/side effects.        Person Under Monitoring Name: Jesse Baldwin  Location: [address] [place] [address]   Infection Prevention Recommendations for Individuals Confirmed to have, or Being Evaluated for, 2019 Novel Coronavirus (COVID-19) Infection Who Receive Care at Home  Individuals who are confirmed to have, or are being evaluated for, COVID-19 should follow the prevention steps below until a healthcare provider or local or state health department says they can return to normal activities.  Stay home except to get medical care You should restrict activities outside your home, except for getting medical care. Do not go to work, school, or public areas, and do not use public transportation or taxis.  Call ahead before visiting your doctor Before your medical appointment, call the healthcare provider and tell them that you have, or are being evaluated for, COVID-19 infection. This will help the healthcare providers office take steps to keep other people from getting infected. Ask your healthcare provider to call the local or state health department.  Monitor your symptoms Seek prompt medical attention if your illness is worsening (e.g., difficulty breathing). Before going to your medical appointment, call the healthcare provider and tell them that you have, or are being evaluated for, COVID-19 infection. Ask your healthcare provider to call the local or state health department.  Wear a facemask You should wear a facemask that covers your nose and mouth when you are in the same room with other people and when you visit a healthcare provider. People who live  with or visit you should also wear a facemask while they are in the same room with you.  Separate yourself from other people in your home As much as possible, you should stay in a different room from other people in your home. Also, you should use a separate bathroom, if available.  Avoid sharing household items You should not  share dishes, drinking glasses, cups, eating utensils, towels, bedding, or other items with other people in your home. After using these items, you should wash them thoroughly with soap and water.  Cover your coughs and sneezes Cover your mouth and nose with a tissue when you cough or sneeze, or you can cough or sneeze into your sleeve. Throw used tissues in a lined trash can, and immediately wash your hands with soap and water for at least 20 seconds or use an alcohol-based hand rub.  Wash your [facility] your hands often and thoroughly with soap and water for at least 20 seconds. You can use an alcohol-based hand sanitizer if soap and water are not available and if your hands are not visibly dirty. Avoid touching your eyes, nose, and mouth with unwashed hands.   Prevention Steps for Caregivers and Household Members of Individuals Confirmed to have, or Being Evaluated for, COVID-19 Infection Being Cared for in the Home  If you live with, or provide care at home for, a person confirmed to have, or being evaluated for, COVID-19 infection please follow these guidelines to prevent infection:  Follow healthcare providers instructions Make sure that you understand and can help the patient follow any healthcare provider instructions for all care.  Provide for the patients basic needs You should help the patient with basic needs in the home and provide support for getting groceries, prescriptions, and other personal needs.  Monitor the patients symptoms If they are getting sicker, call his or her medical provider and tell them that the patient has, or is being evaluated for, COVID-19 infection. This will help the healthcare providers office take steps to keep other people from getting infected. Ask the healthcare provider to call the local or state health department.  Limit the number of people who have contact with the patient  If possible, have only one caregiver for the  patient.  Other household members should stay in another home or place of residence. If this is not possible, they should stay  in another room, or be separated from the patient as much as possible. Use a separate bathroom, if available.  Restrict visitors who do not have an essential need to be in the home.  Keep older adults, very young children, and other sick people away from the patient Keep older adults, very young children, and those who have compromised immune systems or chronic health conditions away from the patient. This includes people with chronic heart, lung, or kidney conditions, diabetes, and cancer.  Ensure good ventilation Make sure that shared spaces in the home have good air flow, such as from an air conditioner or an opened window, weather permitting.  Wash your hands often  Wash your hands often and thoroughly with soap and water for at least 20 seconds. You can use an alcohol based hand sanitizer if soap and water are not available and if your hands are not visibly dirty.  Avoid touching your eyes, nose, and mouth with unwashed hands.  Use disposable paper towels to dry your hands. If not available, use dedicated cloth towels and replace them when they become wet.  Wear a facemask and gloves  Wear a disposable facemask at all times in the room and gloves when you touch or have contact with the patients blood, body fluids, and/or secretions or excretions, such as sweat, saliva, sputum, nasal mucus, vomit, urine, or feces.  Ensure the mask fits over your nose and mouth tightly, and do not touch it during use.  Throw out disposable facemasks and gloves after using them. Do not reuse.  Wash your hands immediately after removing your facemask and gloves.  If your personal clothing becomes contaminated, carefully remove clothing and launder. Wash your hands after handling contaminated clothing.  Place all used disposable facemasks, gloves, and other waste in a lined  container before disposing them with other household waste.  Remove gloves and wash your hands immediately after handling these items.  Do not share dishes, glasses, or other household items with the patient  Avoid sharing household items. You should not share dishes, drinking glasses, cups, eating utensils, towels, bedding, or other items with a patient who is confirmed to have, or being evaluated for, COVID-19 infection.  After the person uses these items, you should wash them thoroughly with soap and water.  Wash laundry thoroughly  Immediately remove and wash clothes or bedding that have blood, body fluids, and/or secretions or excretions, such as sweat, saliva, sputum, nasal mucus, vomit, urine, or feces, on them.  Wear gloves when handling laundry from the patient.  Read and follow directions on labels of laundry or clothing items and detergent. In general, wash and dry with the warmest temperatures recommended on the label.  Clean all areas the individual has used often  Clean all touchable surfaces, such as counters, tabletops, doorknobs, bathroom fixtures, toilets, phones, keyboards, tablets, and bedside tables, every day. Also, clean any surfaces that may have blood, body fluids, and/or secretions or excretions on them.  Wear gloves when cleaning surfaces the patient has come in contact with.  Use a diluted bleach solution (e.g., dilute bleach with 1 part bleach and 10 parts water) or a household disinfectant with a label that says EPA-registered for coronaviruses. To make a bleach solution at home, add 1 tablespoon of bleach to 1 quart (4 cups) of water. For a larger supply, add  cup of bleach to 1 gallon (16 cups) of water.  Read labels of cleaning products and follow recommendations provided on product labels. Labels contain instructions for safe and effective use of the cleaning product including precautions you should take when applying the product, such as wearing gloves or  eye protection and making sure you have good ventilation during use of the product.  Remove gloves and wash hands immediately after cleaning.  Monitor yourself for signs and symptoms of illness Caregivers and household members are considered close contacts, should monitor their health, and will be asked to limit movement outside of the home to the extent possible. Follow the monitoring steps for close contacts listed on the symptom monitoring form.   ? If you have additional questions, contact your local health department or call the epidemiologist on call at [phone] (available 24/7). ? This guidance is subject to change. For the most up-to-date guidance from [facility], please refer to their website: [email or web address]

## 2019-02-11 NOTE — Progress Notes (Signed)
Patient scheduled for outpatient Remdesivir infusion at 11:30 AM on Friday 2/5.  Please advise them to report to [facility] at [address].  Drive to the security guard and tell them you are here for an infusion. They will direct you to the front entrance where we will come and get you.  For questions call [phone].  Thanks

## 2019-02-11 NOTE — Discharge Summary (Signed)
Jesse Baldwin KQA:[id number] DOB: 1955-01-22 DOA: 02/08/2019  PCP: Rutherford Guys, MD  Admit date: 02/08/2019  Discharge date: 02/11/2019  Admitted From: Home   Disposition:  Home   Recommendations for Outpatient Follow-up:   Follow up with PCP in 1-2 weeks  PCP Please obtain BMP/CBC, 2 view CXR in 1week,  (see Discharge instructions)   PCP Please follow up on the following pending results:    Home Health: None   Equipment/Devices: None  Consultations: None  Discharge Condition: Stable    CODE STATUS: Full    Diet Recommendation: Heart Healthy Low Carb  CC - Cough   Brief history of present illness from the day of admission and additional interim summary    64 year old male with past medical history of hypertension, diabetes mellitus type 2, hypertension who tested positive for Covid around 01/29/2019 with worsening cough and fever for the last few days, received Regeneron as an outpatient on 02/08/2019 presented with worsening shortness of breath and was sent in as a direct admit.  CT on 02/05/2019 was negative for PE but showed multifocal pneumonia; he was on Augmentin a few days ago which did not help really.  He was started on Decadron and remdesivir.                                                                 Hospital Course   COVID-19 multifocal pneumonia -Tested positive for COVID-19 on 01/29/2019.  CT 02/05/2019 was negative for PE but showed multifocal pneumonia.  He received Regeneron as an outpatient on 02/08/2019, he currently seems to have mild to moderate disease at best, was given few doses of steroids along with remdesivir, he is symptom-free on room air and does not want to stay in the hospital any further, will be scheduled for his last dose in the remdesivir clinic and discharge, follow with PCP in  a week as well.  SpO2: 92 %  Recent Labs  Lab 02/05/19 1702 02/08/19 2049 02/09/19 0330 02/10/19 0518 02/11/19 0352  CRP  --  9.0* 8.6* 4.8* 2.9*  DDIMER 0.89* 0.87* 0.74* 0.61* 0.32  FERRITIN  --   --   --  1,359*  --   BNP  --   --   --   --  55.9  PROCALCITON  --  0.25  --   --   --     Hepatic Function Latest Ref Rng & Units 02/11/2019 02/10/2019 02/09/2019  Total Protein 6.5 - 8.1 g/dL 6.7 7.7 7.2  Albumin 3.5 - 5.0 g/dL 2.8(L) 3.1(L) 2.7(L)  AST 15 - 41 U/L 76(H) 99(H) 89(H)  ALT 0 - 44 U/L 136(H) 145(H) 120(H)  Alk Phosphatase 38 - 126 U/L 87 93 97  Total Bilirubin 0.3 - 1.2 mg/dL 0.9 1.0 0.7  Diabetes mellitus type 2 uncontrolled with hyperglycemia -Home Metformin on hold.  A1c 9.7.  Poor control outpatient due to hyperglycemia, patient is in complete denial of his glycemic control and states that his A1c has been high due to stress, does not want to take insulin, diabetic and insulin teaching was provided.  PCP to monitor and follow.  Acute kidney injury -Mild.    Almost resolved creatinine 1.26, he is on ARB at home, PCP to monitor renal function closely.  Hypertension -Resume home regimen   Discharge diagnosis     Principal Problem:   Acute hypoxemic respiratory failure due to COVID-19 [facility]) Active Problems:   Hypertension   Type 2 diabetes mellitus with microalbuminuria, without long-term current use of insulin ([place])   AKI (acute kidney injury) [facility])    Discharge instructions    Discharge Instructions    Discharge instructions   Complete by: As directed    You are scheduled for an outpatient infusion of Remdesivir at 11:30 AM on Friday 2/5.  Please report to [facility] at [address].  Drive to the security guard and tell them you are here for an infusion. They will direct you to the front entrance where we will come and get you.  For questions call [phone].    Follow with Primary MD Rutherford Guys, MD in 7 days   Get CBC,  CMP, 2 view Chest X ray -  checked next visit within 1 week by Primary MD   Activity: As tolerated with Full fall precautions use walker/cane & assistance as needed  Disposition Home   Diet: Heart Healthy Low Carb  Accuchecks 4 times/day, Once in AM empty stomach and then before each meal. Log in all results and show them to your Prim.MD in 3 days. If any glucose reading is under 80 or above 300 call your Prim MD immidiately. Follow Low glucose instructions for glucose under 80 as instructed.   Special Instructions: If you have smoked or chewed Tobacco  in the last 2 yrs please stop smoking, stop any regular Alcohol  and or any Recreational drug use.  On your next visit with your primary care physician please Get Medicines reviewed and adjusted.  Please request your Prim.MD to go over all Hospital Tests and Procedure/Radiological results at the follow up, please get all Hospital records sent to your Prim MD by signing hospital release before you go home.  If you experience worsening of your admission symptoms, develop shortness of breath, life threatening emergency, suicidal or homicidal thoughts you must seek medical attention immediately by calling 911 or calling your MD immediately  if symptoms less severe.  You Must read complete instructions/literature along with all the possible adverse reactions/side effects for all the Medicines you take and that have been prescribed to you. Take any new Medicines after you have completely understood and accpet all the possible adverse reactions/side effects.   Increase activity slowly   Complete by: As directed       Discharge Medications   Allergies as of 02/11/2019      Reactions   Septra [bactrim] Nausea And Vomiting   Sulfa Antibiotics Rash      Medication List    STOP taking these medications   losartan 100 MG tablet Commonly known as: COZAAR     TAKE these medications   acetaminophen 500 MG tablet Commonly known as:  TYLENOL Take 1,000 mg by mouth every 6 (six) hours as needed for moderate pain or  fever.   amLODipine 5 MG tablet Commonly known as: NORVASC Take 5 mg by mouth daily.   benzonatate 100 MG capsule Commonly known as: TESSALON Take 1 capsule (100 mg total) by mouth 2 (two) times daily as needed for cough.   guaiFENesin-dextromethorphan 100-10 MG/5ML syrup Commonly known as: ROBITUSSIN DM Take 5 mLs by mouth every 4 (four) hours as needed for cough.   metFORMIN 500 MG 24 hr tablet Commonly known as: GLUCOPHAGE-XR Take 2 tablets (1,000 mg total) by mouth 2 (two) times daily with a meal.   repaglinide 1 MG tablet Commonly known as: PRANDIN TAKE 1 TABLET (1 MG TOTAL) BY MOUTH 3 (THREE) TIMES DAILY BEFORE MEALS.   rosuvastatin 20 MG tablet Commonly known as: CRESTOR Take 1 tablet (20 mg total) by mouth daily.   Xalatan 0.005 % ophthalmic solution Generic drug: latanoprost Place 1 drop into both eyes at bedtime.       Follow-up Information    Rutherford Guys, MD. Schedule an appointment as soon as possible for a visit in 1 week(s).   Specialty: Family Medicine Contact information: [address] [place] [address] [phone]           Major procedures and Radiology Reports - PLEASE review detailed and final reports thoroughly  -       DG Chest 2 View  Result Date: 02/04/2019 CLINICAL DATA:  Cough and shortness of breath.  COVID-19 positive EXAM: CHEST - 2 VIEW COMPARISON:  None. FINDINGS: There is atelectatic change in the left mid lung. Lungs elsewhere clear. Heart size and pulmonary vascularity are normal. No adenopathy. There is aortic atherosclerosis. There is degenerative change in the thoracic spine. IMPRESSION: Atelectatic change left mid lung.  Lungs elsewhere clear. No adenopathy. Heart size normal. Aortic Atherosclerosis (ICD10-I70.0). Electronically Signed   By: Lowella Grip III M.D.   On: 02/04/2019 09:56   CT Angio Chest PE W/Cm &/Or Wo Cm  Result  Date: 02/05/2019 CLINICAL DATA:  Shortness of breath and fever. EXAM: CT ANGIOGRAPHY CHEST WITH CONTRAST TECHNIQUE: Multidetector CT imaging of the chest was performed using the standard protocol during bolus administration of intravenous contrast. Multiplanar CT image reconstructions and MIPs were obtained to evaluate the vascular anatomy. CONTRAST:  153m OMNIPAQUE IOHEXOL 350 MG/ML SOLN COMPARISON:  None. FINDINGS: Cardiovascular: There is mild calcification of the thoracic aorta satisfactory opacification of the pulmonary arteries to the segmental level. No evidence of pulmonary embolism. Normal heart size. No pericardial effusion. Marked severity coronary artery calcification is seen. Mediastinum/Nodes: No enlarged mediastinal, hilar, or axillary lymph nodes. Thyroid gland, trachea, and esophagus demonstrate no significant findings. Lungs/Pleura: Moderate severity patchy infiltrates are seen throughout both lungs. There is no evidence of a pleural effusion or pneumothorax. Upper Abdomen: There is a small hiatal hernia. Multiple surgical clips are seen within the gallbladder fossa. Musculoskeletal: Degenerative changes seen throughout the thoracic spine Review of the MIP images confirms the above findings. IMPRESSION: 1. No CT evidence of pulmonary embolism. 2. Moderate severity patchy infiltrates throughout both lungs. 3. Marked severity coronary artery calcification. 4. Small hiatal hernia. Electronically Signed   By: TVirgina NorfolkM.D.   On: 02/05/2019 19:13   DG CHEST PORT 1 VIEW  Result Date: 02/08/2019 CLINICAL DATA:  Shortness of breath. EXAM: PORTABLE CHEST 1 VIEW COMPARISON:  February 05, 2019 FINDINGS: Small patchy infiltrates are seen throughout both lungs. There is no evidence of a pleural effusion or pneumothorax. The heart size and mediastinal contours are within normal limits.  The visualized skeletal structures are unremarkable. Radiopaque surgical clips are seen overlying the right upper  quadrant. IMPRESSION: 1. Mild to moderate severity diffuse bilateral infiltrates. Electronically Signed   By: Virgina Norfolk M.D.   On: 02/08/2019 20:15   DG Chest Port 1 View  Result Date: 02/05/2019 CLINICAL DATA:  Shortness of breath and fever. EXAM: PORTABLE CHEST 1 VIEW COMPARISON:  February 04, 2019 FINDINGS: Multiple overlying radiopaque cardiac lead wires are seen. Very mild atelectasis and/or infiltrate is seen within the right lung base. This represents a new finding when compared to the prior study. There is no evidence of a pleural effusion or pneumothorax. The heart size and mediastinal contours are within normal limits. The visualized skeletal structures are unremarkable. IMPRESSION: Very mild right basilar atelectasis and/or infiltrate. This represents a new finding when compared to the prior study dated February 04, 2019. Electronically Signed   By: Virgina Norfolk M.D.   On: 02/05/2019 17:37    Micro Results     No results found for this or any previous visit (from the past 240 hour(s)).  Today   Subjective    Jesse Baldwin today has no headache,no chest abdominal pain,no new weakness tingling or numbness, feels much better wants to go home today.    Objective   Blood pressure (!) 156/66, pulse 80, temperature (!) 97.3 F (36.3 C), temperature source Oral, resp. rate 20, height '5\' 2"'$  (1.575 m), weight 68 kg, SpO2 92 %.   Intake/Output Summary (Last 24 hours) at 02/11/2019 0938 Last data filed at 02/10/2019 1300 Gross per 24 hour  Intake 960 ml  Output --  Net 960 ml    Exam Awake Alert,  , No new F.N deficits, Normal affect [place].AT,PERRAL Supple Neck,No JVD, No cervical lymphadenopathy appriciated.  Symmetrical Chest wall movement, Good air movement bilaterally, CTAB RRR,No Gallops,Rubs or new Murmurs, No Parasternal Heave +ve B.Sounds, Abd Soft, Non tender, No organomegaly appriciated, No rebound -guarding or rigidity. No Cyanosis, Clubbing or edema, No new  Rash or bruise   Data Review   CBC w Diff:  Lab Results  Component Value Date   WBC 12.7 (H) 02/11/2019   HGB 13.8 02/11/2019   HGB 15.2 07/07/2017   HCT 40.3 02/11/2019   HCT 45.6 07/07/2017   PLT 243 02/11/2019   PLT 202 07/07/2017   LYMPHOPCT 8 02/11/2019   MONOPCT 2 02/11/2019   EOSPCT 0 02/11/2019   BASOPCT 0 02/11/2019    CMP:  Lab Results  Component Value Date   NA 136 02/11/2019   NA 141 12/01/2018   K 4.2 02/11/2019   CL 105 02/11/2019   CO2 23 02/11/2019   BUN 34 (H) 02/11/2019   BUN 20 12/01/2018   CREATININE 1.26 (H) 02/11/2019   CREATININE 0.97 11/06/2015   PROT 6.7 02/11/2019   PROT 7.7 12/01/2018   ALBUMIN 2.8 (L) 02/11/2019   ALBUMIN 4.5 12/01/2018   BILITOT 0.9 02/11/2019   BILITOT 0.8 12/01/2018   ALKPHOS 87 02/11/2019   AST 76 (H) 02/11/2019   ALT 136 (H) 02/11/2019  .   Total Time in preparing paper work, data evaluation and todays exam - 42 minutes  Lala Lund M.D on 02/11/2019 at 9:38 AM  [facility]   Office  [phone]

## 2019-02-12 ENCOUNTER — Encounter (HOSPITAL_COMMUNITY): Payer: Self-pay

## 2019-02-12 ENCOUNTER — Ambulatory Visit (HOSPITAL_COMMUNITY)
Admission: RE | Admit: 2019-02-12 | Discharge: 2019-02-12 | Disposition: A | Discharge status: Home / Self Care | Payer: Medicare Other | Source: Ambulatory Visit | Attending: Pulmonary Disease | Admitting: Pulmonary Disease

## 2019-02-12 DIAGNOSIS — J1282 Pneumonia due to coronavirus disease 2019: Secondary | ICD-10-CM | POA: Diagnosis not present

## 2019-02-12 DIAGNOSIS — U071 COVID-19: Secondary | ICD-10-CM | POA: Insufficient documentation

## 2019-02-12 MED ORDER — SODIUM CHLORIDE 0.9 % IV SOLN: INTRAVENOUS | Status: DC | PRN

## 2019-02-12 MED ORDER — DIPHENHYDRAMINE HCL 50 MG/ML IJ SOLN: 50.0000 mg | Freq: Once | INTRAMUSCULAR | Status: DC | PRN

## 2019-02-12 MED ORDER — FAMOTIDINE IN NACL 20-0.9 MG/50ML-% IV SOLN: 20.0000 mg | Freq: Once | INTRAVENOUS | Status: DC | PRN

## 2019-02-12 MED ORDER — METHYLPREDNISOLONE SODIUM SUCC 125 MG IJ SOLR: 125.0000 mg | Freq: Once | INTRAMUSCULAR | Status: DC | PRN

## 2019-02-12 MED ORDER — SODIUM CHLORIDE 0.9 % IV SOLN
100.0000 mg | Freq: Once | INTRAVENOUS | Status: AC
  Administered 2019-02-12: 100 mg via INTRAVENOUS
  Filled 2019-02-12: qty 20

## 2019-02-12 MED ORDER — ALBUTEROL SULFATE HFA 108 (90 BASE) MCG/ACT IN AERS: 2.0000 | INHALATION_SPRAY | Freq: Once | RESPIRATORY_TRACT | Status: DC | PRN

## 2019-02-12 MED ORDER — EPINEPHRINE 0.3 MG/0.3ML IJ SOAJ: 0.3000 mg | Freq: Once | INTRAMUSCULAR | Status: DC | PRN

## 2019-02-12 NOTE — Progress Notes (Signed)
Patient ID: Jesse Baldwin, male   DOB: [birth year], 64 y.o.   MRN: [id number]  Diagnosis: [id number]  Procedure: Covid Infusion Clinic Med: remdesivir infusion.  Complications: No immediate complications noted.  Discharge: Discharged home   Heide Scales 02/12/2019

## 2019-02-17 ENCOUNTER — Ambulatory Visit: Payer: Federal, State, Local not specified - PPO

## 2019-03-02 ENCOUNTER — Other Ambulatory Visit: Payer: Self-pay | Admitting: Family Medicine

## 2019-03-02 ENCOUNTER — Ambulatory Visit: Payer: Federal, State, Local not specified - PPO

## 2019-03-02 ENCOUNTER — Other Ambulatory Visit: Payer: Self-pay

## 2019-03-02 DIAGNOSIS — R809 Proteinuria, unspecified: Secondary | ICD-10-CM

## 2019-03-02 DIAGNOSIS — I10 Essential (primary) hypertension: Secondary | ICD-10-CM

## 2019-03-02 DIAGNOSIS — E1129 Type 2 diabetes mellitus with other diabetic kidney complication: Secondary | ICD-10-CM

## 2019-03-02 DIAGNOSIS — E785 Hyperlipidemia, unspecified: Secondary | ICD-10-CM | POA: Diagnosis not present

## 2019-03-03 DIAGNOSIS — H401131 Primary open-angle glaucoma, bilateral, mild stage: Secondary | ICD-10-CM | POA: Diagnosis not present

## 2019-03-03 DIAGNOSIS — H2513 Age-related nuclear cataract, bilateral: Secondary | ICD-10-CM | POA: Diagnosis not present

## 2019-03-03 DIAGNOSIS — E119 Type 2 diabetes mellitus without complications: Secondary | ICD-10-CM | POA: Diagnosis not present

## 2019-03-03 DIAGNOSIS — H10413 Chronic giant papillary conjunctivitis, bilateral: Secondary | ICD-10-CM | POA: Diagnosis not present

## 2019-03-03 LAB — COMPREHENSIVE METABOLIC PANEL
ALT: 40 IU/L (ref 0–44)
AST: 30 IU/L (ref 0–40)
Albumin/Globulin Ratio: 1.3 (ref 1.2–2.2)
Albumin: 4.2 g/dL (ref 3.8–4.8)
Alkaline Phosphatase: 96 IU/L (ref 39–117)
BUN/Creatinine Ratio: 14 (ref 10–24)
BUN: 18 mg/dL (ref 8–27)
Bilirubin Total: 0.5 mg/dL (ref 0.0–1.2)
CO2: 21 mmol/L (ref 20–29)
Calcium: 10 mg/dL (ref 8.6–10.2)
Chloride: 102 mmol/L (ref 96–106)
Creatinine, Ser: 1.25 mg/dL (ref 0.76–1.27)
GFR calc Af Amer: 69 mL/min/{1.73_m2} (ref >59)
GFR calc non Af Amer: 60 mL/min/{1.73_m2} (ref >59)
Globulin, Total: 3.3 g/dL (ref 1.5–4.5)
Glucose: 141 mg/dL — ABNORMAL HIGH (ref 65–99)
Potassium: 4.1 mmol/L (ref 3.5–5.2)
Sodium: 143 mmol/L (ref 134–144)
Total Protein: 7.5 g/dL (ref 6.0–8.5)

## 2019-03-03 LAB — MICROALBUMIN / CREATININE URINE RATIO
Creatinine, Urine: 99.6 mg/dL
Microalb/Creat Ratio: 61 mg/g creat — ABNORMAL HIGH (ref 0–29)
Microalbumin, Urine: 60.8 ug/mL

## 2019-03-03 LAB — LIPID PANEL
Chol/HDL Ratio: 4.3 ratio (ref 0.0–5.0)
Cholesterol, Total: 191 mg/dL (ref 100–199)
HDL: 44 mg/dL (ref >39)
LDL Chol Calc (NIH): 118 mg/dL — ABNORMAL HIGH (ref 0–99)
Triglycerides: 163 mg/dL — ABNORMAL HIGH (ref 0–149)
VLDL Cholesterol Cal: 29 mg/dL (ref 5–40)

## 2019-03-03 LAB — HEMOGLOBIN A1C
Est. average glucose Bld gHb Est-mCnc: 232 mg/dL
Hgb A1c MFr Bld: 9.7 % — ABNORMAL HIGH (ref 4.8–5.6)

## 2019-03-08 ENCOUNTER — Ambulatory Visit (INDEPENDENT_AMBULATORY_CARE_PROVIDER_SITE_OTHER): Payer: Medicare Other

## 2019-03-08 ENCOUNTER — Other Ambulatory Visit: Payer: Self-pay

## 2019-03-08 ENCOUNTER — Ambulatory Visit (INDEPENDENT_AMBULATORY_CARE_PROVIDER_SITE_OTHER): Payer: Medicare Other | Admitting: Family Medicine

## 2019-03-08 ENCOUNTER — Encounter: Payer: Self-pay | Admitting: Family Medicine

## 2019-03-08 VITALS — BP 120/58 | HR 74 | Temp 98.0°F | Ht 62.0 in | Wt 151.0 lb

## 2019-03-08 DIAGNOSIS — Z8701 Personal history of pneumonia (recurrent): Secondary | ICD-10-CM

## 2019-03-08 DIAGNOSIS — R809 Proteinuria, unspecified: Secondary | ICD-10-CM | POA: Diagnosis not present

## 2019-03-08 DIAGNOSIS — E785 Hyperlipidemia, unspecified: Secondary | ICD-10-CM | POA: Diagnosis not present

## 2019-03-08 DIAGNOSIS — I10 Essential (primary) hypertension: Secondary | ICD-10-CM | POA: Diagnosis not present

## 2019-03-08 DIAGNOSIS — E1129 Type 2 diabetes mellitus with other diabetic kidney complication: Secondary | ICD-10-CM

## 2019-03-08 MED ORDER — LOSARTAN POTASSIUM 25 MG PO TABS: 25.0000 mg | ORAL_TABLET | Freq: Every day | ORAL | 1 refills | Status: DC

## 2019-03-08 MED ORDER — ROSUVASTATIN CALCIUM 40 MG PO TABS: 40.0000 mg | ORAL_TABLET | Freq: Every day | ORAL | 3 refills | Status: DC

## 2019-03-08 MED ORDER — TRULICITY 0.75 MG/0.5ML ~~LOC~~ SOAJ: 0.7500 mg | SUBCUTANEOUS | 5 refills | Status: DC

## 2019-03-08 NOTE — Progress Notes (Signed)
3/1/20218:43 AM  Al Corpus 05/15/1955, 64 y.o., male [id number]  Chief Complaint  Patient presents with  . Diabetes    3 month follo up    HPI:   Patient is a 64 y.o. male with past medical history significant for DM2, HTN, HLPwho presents today for routine followup  Last OV Dec 2020 - increased metformin to 1000mg  BID Had covid in jan 2021 - treated with infusion, admitted in feb for multifocal PNA associated with covid, treated with, augmentin, decadron and remdesivir recommended repeat CXR Losartan stopped due to crt 1.26  He reports he is doing well He has recovered completely from covid and PNA He has no acute concerns today He has not taken his amlodipine for about a month He checks his BP at home, similar to today He checks cbgs fasting daily, yesterday 136, he reports cbgs have been high since discharged from the hospital but they have been coming down Had eye exam at Dr [name]'s office last week - he reports a normal eye exam  Lab Results  Component Value Date   HGBA1C 9.7 (H) 03/02/2019   HGBA1C 9.7 (H) 02/08/2019   HGBA1C 7.7 (H) 12/01/2018   Lab Results  Component Value Date   MICROALBUR 23.4 11/06/2015   [place] 118 (H) 03/02/2019   CREATININE 1.25 03/02/2019   urine microalb 61, feb 2021, improved from 187 a year ago  Depression screen [facility] 2/9 02/03/2019 02/02/2019 09/08/2018  Decreased Interest 0 0 0  Down, Depressed, Hopeless 0 0 0  PHQ - 2 Score 0 0 0    Fall Risk  03/08/2019 02/03/2019 02/02/2019 12/08/2018 09/08/2018  Falls in the past year? 0 0 0 0 0  Number falls in past yr: 0 - 0 0 0  Injury with Fall? 0 - 0 0 0  Follow up Falls evaluation completed Falls evaluation completed Falls evaluation completed - -     Allergies  Allergen Reactions  . Septra [Bactrim] Nausea And Vomiting  . Sulfa Antibiotics Rash    Prior to Admission medications   Medication Sig Start Date End Date Taking? Authorizing Provider  amLODipine (NORVASC) 5 MG tablet  Take 5 mg by mouth daily. 10/29/18  Yes [provider]  latanoprost (XALATAN) 0.005 % ophthalmic solution Place 1 drop into both eyes at bedtime.    Yes [provider]  metFORMIN (GLUCOPHAGE-XR) 500 MG 24 hr tablet TAKE 4 TABLETS BY MOUTH DAILY WITH BREAKFAST 03/02/19  Yes Rutherford Guys, MD  repaglinide (PRANDIN) 1 MG tablet TAKE 1 TABLET (1 MG TOTAL) BY MOUTH 3 (THREE) TIMES DAILY BEFORE MEALS. 03/02/19  Yes Rutherford Guys, MD  rosuvastatin (CRESTOR) 20 MG tablet TAKE 1 TABLET BY MOUTH EVERY DAY 03/02/19  Yes Rutherford Guys, MD  acetaminophen (TYLENOL) 500 MG tablet Take 1,000 mg by mouth every 6 (six) hours as needed for moderate pain or fever.    [provider]    Past Medical History:  Diagnosis Date  . Diabetes mellitus without complication ([place])   . Hyperlipidemia   . Hypertension     Past Surgical History:  Procedure Laterality Date  . CARPAL TUNNEL RELEASE Bilateral   . CATARACT EXTRACTION Right   . CHOLECYSTECTOMY  1990  . CYST REMOVAL NECK      Social History   Tobacco Use  . Smoking status: Former Smoker    Quit date: 04/07/1981    Years since quitting: 37.9  . Smokeless tobacco: Never Used  Substance Use Topics  .  Alcohol use: No    Family History  Problem Relation Age of Onset  . Diabetes Mother   . Hypertension Mother   . Diabetes Father   . Hypertension Father   . Diabetes Sister   . Hypertension Sister   . Cancer Sister 50       bone marrow cancer  . Diabetes Brother   . Hypertension Brother   . Colon cancer Neg Hx     Review of Systems  Constitutional: Negative for chills and fever.  Respiratory: Negative for cough and shortness of breath.   Cardiovascular: Negative for chest pain, palpitations and leg swelling.  Gastrointestinal: Negative for abdominal pain, nausea and vomiting.     OBJECTIVE:  Today's Vitals   03/08/19 0827 03/08/19 0847  BP: (!) 144/80 (!) 120/58  Pulse: 74   Temp: 98 F (36.7 C)    TempSrc: Temporal   SpO2: 97%   Weight: 151 lb (68.5 kg)   Height: 5\' 2"  (1.575 m)    Body mass index is 27.62 kg/m.   Physical Exam Vitals and nursing note reviewed.  Constitutional:      Appearance: He is well-developed.  HENT:     Head: Normocephalic and atraumatic.  Eyes:     Conjunctiva/sclera: Conjunctivae normal.     Pupils: Pupils are equal, round, and reactive to light.  Cardiovascular:     Rate and Rhythm: Normal rate and regular rhythm.     Heart sounds: No murmur. No friction rub. No gallop.   Pulmonary:     Effort: Pulmonary effort is normal.     Breath sounds: Normal breath sounds. No wheezing or rales.  Musculoskeletal:     Cervical back: Neck supple.  Skin:    General: Skin is warm and dry.  Neurological:     Mental Status: He is alert and oriented to person, place, and time.     No results found for this or any previous visit (from the past 24 hour(s)).  No results found.   ASSESSMENT and PLAN  1. Type 2 diabetes mellitus with microalbuminuria, without long-term current use of insulin (HCC) Uncontrolled. Starting trulicity, reviewed r/se/b. demonstrated with demo pen. D/c prandin. Cont working on [employer]. Starting low dose losartan due to micorlabuminuria - Hemoglobin A1c; Future  2. Essential hypertension Normal BP. Trial of low dose losartan for microalbuminuria. Cont home BP monitoring. Reviewed r/se/b  3. Hyperlipidemia, unspecified hyperlipidemia type Uncontrolled. Increasing crestor to max dose. Reviewed r/se/b, cont working on [facility] - Comprehensive metabolic panel; Future - Lipid panel; Future  4. History of recent pneumonia Recovered completely. Repeat CXR to ensure no underlying pathology. - DG Chest 2 View; Future  Other orders - rosuvastatin (CRESTOR) 40 MG tablet; Take 1 tablet (40 mg total) by mouth daily. - Dulaglutide (TRULICITY) A999333 0000000 SOPN; Inject 0.75 mg into the skin once a week. - losartan (COZAAR) 25 MG tablet; Take 1  tablet (25 mg total) by mouth daily.  Return in about 3 months (around 06/08/2019) for with fasting labs 1 week prior.    Rutherford Guys, MD Primary Care at [place], [place] [address] Ph.  [phone] Fax [phone]

## 2019-03-08 NOTE — Patient Instructions (Signed)
° ° ° °  If you have lab work done today you will be contacted with your lab results within the next 2 weeks.  If you have not heard from us then please contact us. The fastest way to get your results is to register for My Chart. ° ° °IF you received an x-ray today, you will receive an invoice from [facility]. Please contact [facility] at [phone] with questions or concerns regarding your invoice.  ° °IF you received labwork today, you will receive an invoice from [facility]. Please contact [facility] at [phone] with questions or concerns regarding your invoice.  ° °Our billing staff will not be able to assist you with questions regarding bills from these companies. ° °You will be contacted with the lab results as soon as they are available. The fastest way to get your results is to activate your My Chart account. Instructions are located on the last page of this paperwork. If you have not heard from us regarding the results in 2 weeks, please contact this office. °  ° ° ° °

## 2019-03-17 ENCOUNTER — Other Ambulatory Visit: Payer: Self-pay | Admitting: Family Medicine

## 2019-04-27 ENCOUNTER — Other Ambulatory Visit: Payer: Self-pay | Admitting: Family Medicine

## 2019-04-27 DIAGNOSIS — I10 Essential (primary) hypertension: Secondary | ICD-10-CM

## 2019-05-02 ENCOUNTER — Other Ambulatory Visit: Payer: Self-pay | Admitting: Family Medicine

## 2019-05-14 ENCOUNTER — Other Ambulatory Visit: Payer: Self-pay | Admitting: Family Medicine

## 2019-06-04 ENCOUNTER — Ambulatory Visit (INDEPENDENT_AMBULATORY_CARE_PROVIDER_SITE_OTHER): Payer: Medicare Other | Admitting: Family Medicine

## 2019-06-04 ENCOUNTER — Other Ambulatory Visit: Payer: Self-pay

## 2019-06-04 DIAGNOSIS — E785 Hyperlipidemia, unspecified: Secondary | ICD-10-CM | POA: Diagnosis not present

## 2019-06-04 DIAGNOSIS — E1129 Type 2 diabetes mellitus with other diabetic kidney complication: Secondary | ICD-10-CM

## 2019-06-04 DIAGNOSIS — R809 Proteinuria, unspecified: Secondary | ICD-10-CM

## 2019-06-05 LAB — COMPREHENSIVE METABOLIC PANEL
ALT: 38 IU/L (ref 0–44)
AST: 32 IU/L (ref 0–40)
Albumin/Globulin Ratio: 1.3 (ref 1.2–2.2)
Albumin: 4.3 g/dL (ref 3.8–4.8)
Alkaline Phosphatase: 103 IU/L (ref 48–121)
BUN/Creatinine Ratio: 19 (ref 10–24)
BUN: 25 mg/dL (ref 8–27)
Bilirubin Total: 0.6 mg/dL (ref 0.0–1.2)
CO2: 21 mmol/L (ref 20–29)
Calcium: 9.6 mg/dL (ref 8.6–10.2)
Chloride: 103 mmol/L (ref 96–106)
Creatinine, Ser: 1.34 mg/dL — ABNORMAL HIGH (ref 0.76–1.27)
GFR calc Af Amer: 64 mL/min/{1.73_m2} (ref >59)
GFR calc non Af Amer: 55 mL/min/{1.73_m2} — ABNORMAL LOW (ref >59)
Globulin, Total: 3.3 g/dL (ref 1.5–4.5)
Glucose: 153 mg/dL — ABNORMAL HIGH (ref 65–99)
Potassium: 4.8 mmol/L (ref 3.5–5.2)
Sodium: 139 mmol/L (ref 134–144)
Total Protein: 7.6 g/dL (ref 6.0–8.5)

## 2019-06-05 LAB — LIPID PANEL
Chol/HDL Ratio: 4.3 ratio (ref 0.0–5.0)
Cholesterol, Total: 136 mg/dL (ref 100–199)
HDL: 32 mg/dL — ABNORMAL LOW (ref >39)
LDL Chol Calc (NIH): 80 mg/dL (ref 0–99)
Triglycerides: 137 mg/dL (ref 0–149)
VLDL Cholesterol Cal: 24 mg/dL (ref 5–40)

## 2019-06-05 LAB — HEMOGLOBIN A1C
Est. average glucose Bld gHb Est-mCnc: 177 mg/dL
Hgb A1c MFr Bld: 7.8 % — ABNORMAL HIGH (ref 4.8–5.6)

## 2019-06-10 ENCOUNTER — Encounter: Payer: Self-pay | Admitting: Family Medicine

## 2019-06-10 ENCOUNTER — Ambulatory Visit (INDEPENDENT_AMBULATORY_CARE_PROVIDER_SITE_OTHER): Payer: Medicare Other | Admitting: Family Medicine

## 2019-06-10 ENCOUNTER — Other Ambulatory Visit: Payer: Self-pay

## 2019-06-10 VITALS — BP 128/79 | HR 76 | Temp 97.8°F | Ht 62.0 in | Wt 152.8 lb

## 2019-06-10 DIAGNOSIS — E782 Mixed hyperlipidemia: Secondary | ICD-10-CM

## 2019-06-10 DIAGNOSIS — R809 Proteinuria, unspecified: Secondary | ICD-10-CM

## 2019-06-10 DIAGNOSIS — I10 Essential (primary) hypertension: Secondary | ICD-10-CM

## 2019-06-10 DIAGNOSIS — Z86010 Personal history of colonic polyps: Secondary | ICD-10-CM

## 2019-06-10 DIAGNOSIS — Z1211 Encounter for screening for malignant neoplasm of colon: Secondary | ICD-10-CM

## 2019-06-10 DIAGNOSIS — E1129 Type 2 diabetes mellitus with other diabetic kidney complication: Secondary | ICD-10-CM | POA: Diagnosis not present

## 2019-06-10 MED ORDER — SITAGLIPTIN PHOSPHATE 100 MG PO TABS: 100.0000 mg | ORAL_TABLET | Freq: Every day | ORAL | 1 refills | Status: DC

## 2019-06-10 NOTE — Patient Instructions (Signed)
° ° ° °  If you have lab work done today you will be contacted with your lab results within the next 2 weeks.  If you have not heard from us then please contact us. The fastest way to get your results is to register for My Chart. ° ° °IF you received an x-ray today, you will receive an invoice from [facility]. Please contact [facility] at [phone] with questions or concerns regarding your invoice.  ° °IF you received labwork today, you will receive an invoice from [facility]. Please contact [facility] at [phone] with questions or concerns regarding your invoice.  ° °Our billing staff will not be able to assist you with questions regarding bills from these companies. ° °You will be contacted with the lab results as soon as they are available. The fastest way to get your results is to activate your My Chart account. Instructions are located on the last page of this paperwork. If you have not heard from us regarding the results in 2 weeks, please contact this office. °  ° ° ° °

## 2019-06-10 NOTE — Progress Notes (Signed)
6/3/20218:45 AM  Jesse Baldwin 10/11/1955, 64 y.o., male [id number]  Chief Complaint  Patient presents with  . Diabetes    done eye appt the first wk of May  and making appt for gastro    HPI:   Patient is a 64 y.o. male with past medical history significant for DM2, HTN, HLPwho presents today for routine followup  Last OV march 2021 - started trulicity, increased crestor, started losartan for urine microalbuminuria  He reports he is overall doing well He stopped taking trulicity after about a month as it was giving him diarrhea In the past tried farxiga, stopped due to UTI Used to be on prandin 1mg  TID Fasting cbg this AM 141 He is otherwise tolerating losartan and crestor wo issues Had eye exam on Mar 03 2019 with Dr Gala Romney Sees Dr Bing Plume later this month Needs referral for colonoscopy, last one April 2016, bx tubular adenoma  Wt Readings from Last 3 Encounters:  06/10/19 152 lb 12.8 oz (69.3 kg)  03/08/19 151 lb (68.5 kg)  02/09/19 149 lb 14.6 oz (68 kg)   Lab Results  Component Value Date   HGBA1C 7.8 (H) 06/04/2019   HGBA1C 9.7 (H) 03/02/2019   HGBA1C 9.7 (H) 02/08/2019   Lab Results  Component Value Date   MICROALBUR 23.4 11/06/2015   LDLCALC 80 06/04/2019   CREATININE 1.34 (H) 06/04/2019  GFR 55  Lab Results  Component Value Date   CREATININE 1.34 (H) 06/04/2019   CREATININE 1.25 03/02/2019   CREATININE 1.26 (H) 02/11/2019     Depression screen PHQ 2/9 06/10/2019 02/03/2019 02/02/2019  Decreased Interest 0 0 0  Down, Depressed, Hopeless 0 0 0  PHQ - 2 Score 0 0 0    Fall Risk  06/10/2019 03/08/2019 02/03/2019 02/02/2019 12/08/2018  Falls in the past year? 0 0 0 0 0  Number falls in past yr: 0 0 - 0 0  Injury with Fall? 0 0 - 0 0  Follow up - Falls evaluation completed Falls evaluation completed Falls evaluation completed -     Allergies  Allergen Reactions  . Septra [Bactrim] Nausea And Vomiting  . Sulfa Antibiotics Rash    Prior to  Admission medications   Medication Sig Start Date End Date Taking? Authorizing Provider  acetaminophen (TYLENOL) 500 MG tablet Take 1,000 mg by mouth every 6 (six) hours as needed for moderate pain or fever.   Yes [provider]  Dulaglutide (TRULICITY) A999333 0000000 SOPN Inject 0.75 mg into the skin once a week. 03/08/19  Yes Rutherford Guys, MD  latanoprost (XALATAN) 0.005 % ophthalmic solution Place 1 drop into both eyes at bedtime.    Yes [provider]  losartan (COZAAR) 25 MG tablet Take 1 tablet (25 mg total) by mouth daily. 03/08/19  Yes Rutherford Guys, MD  metFORMIN (GLUCOPHAGE-XR) 500 MG 24 hr tablet TAKE 4 TABLETS BY MOUTH DAILY WITH BREAKFAST 03/02/19  Yes Rutherford Guys, MD  rosuvastatin (CRESTOR) 40 MG tablet Take 1 tablet (40 mg total) by mouth daily. 03/08/19  Yes Rutherford Guys, MD    Past Medical History:  Diagnosis Date  . Diabetes mellitus without complication ([place])   . Hyperlipidemia   . Hypertension     Past Surgical History:  Procedure Laterality Date  . CARPAL TUNNEL RELEASE Bilateral   . CATARACT EXTRACTION Right   . CHOLECYSTECTOMY  1990  . CYST REMOVAL NECK      Social History   Tobacco  Use  . Smoking status: Former Smoker    Quit date: 04/07/1981    Years since quitting: 38.2  . Smokeless tobacco: Never Used  Substance Use Topics  . Alcohol use: No    Family History  Problem Relation Age of Onset  . Diabetes Mother   . Hypertension Mother   . Diabetes Father   . Hypertension Father   . Diabetes Sister   . Hypertension Sister   . Cancer Sister 50       bone marrow cancer  . Diabetes Brother   . Hypertension Brother   . Colon cancer Neg Hx     Review of Systems  Constitutional: Negative for chills and fever.  Respiratory: Negative for cough and shortness of breath.   Cardiovascular: Negative for chest pain, palpitations and leg swelling.  Gastrointestinal: Negative for abdominal pain, nausea and vomiting.      OBJECTIVE:  Today's Vitals   06/10/19 0835  BP: 128/79  Pulse: 76  Temp: 97.8 F (36.6 C)  SpO2: 98%  Weight: 152 lb 12.8 oz (69.3 kg)  Height: 5\' 2"  (1.575 m)   Body mass index is 27.95 kg/m.   Physical Exam Vitals and nursing note reviewed.  Constitutional:      Appearance: He is well-developed.  HENT:     Head: Normocephalic and atraumatic.  Eyes:     Conjunctiva/sclera: Conjunctivae normal.     Pupils: Pupils are equal, round, and reactive to light.  Cardiovascular:     Rate and Rhythm: Normal rate and regular rhythm.     Heart sounds: No murmur. No friction rub. No gallop.   Pulmonary:     Effort: Pulmonary effort is normal.     Breath sounds: Normal breath sounds. No wheezing or rales.  Musculoskeletal:     Cervical back: Neck supple.  Skin:    General: Skin is warm and dry.  Neurological:     Mental Status: He is alert and oriented to person, place, and time.       Diabetic Foot Form - Detailed   Diabetic Foot Exam - detailed Can the patient see the bottom of their feet?: No Are the shoes appropriate in style and fit?: Yes Is there swelling or and abnormal foot shape?: No Is there a claw toe deformity?: No Is there elevated skin temparature?: No Is there foot or ankle muscle weakness?: No Normal Range of Motion: No Semmes-Weinstein Monofilament Test R Site 1-Great Toe: Neg L Site 1-Great Toe: Neg        No results found for this or any previous visit (from the past 24 hour(s)).  No results found.   ASSESSMENT and PLAN  1. Type 2 diabetes mellitus with microalbuminuria, without long-term current use of insulin (HCC) Improved but intolerant of trulicity due to GI side effects. Intolerant of SGLT2 due to UTI. Trial of januvia. reveiwed r/se/b. Consider adding back prandin.  - HM DIABETES FOOT EXAM - Comprehensive metabolic panel; Future - Hemoglobin A1c; Future  2. Screen for colon cancer - Ambulatory referral to Gastroenterology  3.  History of colonic polyps - Ambulatory referral to Gastroenterology  4. Mixed hyperlipidemia Controlled. Continue current regime.  - Lipid panel; Future  5. Essential hypertension Controlled. Continue current regime.   Other orders - sitaGLIPtin (JANUVIA) 100 MG tablet; Take 1 tablet (100 mg total) by mouth daily.  Return in about 3 months (around 09/10/2019).    Rutherford Guys, MD Primary Care at [place], [place] [address]  Ph.  [phone] Fax [phone]

## 2019-06-11 ENCOUNTER — Other Ambulatory Visit: Payer: Self-pay | Admitting: Family Medicine

## 2019-06-22 ENCOUNTER — Telehealth: Payer: Self-pay | Admitting: *Deleted

## 2019-06-29 ENCOUNTER — Telehealth: Payer: Self-pay | Admitting: *Deleted

## 2019-06-29 NOTE — Telephone Encounter (Signed)
Schedule awv  

## 2019-07-05 DIAGNOSIS — H2513 Age-related nuclear cataract, bilateral: Secondary | ICD-10-CM | POA: Diagnosis not present

## 2019-07-05 DIAGNOSIS — E119 Type 2 diabetes mellitus without complications: Secondary | ICD-10-CM | POA: Diagnosis not present

## 2019-07-05 DIAGNOSIS — H10413 Chronic giant papillary conjunctivitis, bilateral: Secondary | ICD-10-CM | POA: Diagnosis not present

## 2019-07-05 DIAGNOSIS — H401131 Primary open-angle glaucoma, bilateral, mild stage: Secondary | ICD-10-CM | POA: Diagnosis not present

## 2019-08-04 NOTE — Telephone Encounter (Signed)
Duplicate note

## 2019-09-07 ENCOUNTER — Ambulatory Visit (INDEPENDENT_AMBULATORY_CARE_PROVIDER_SITE_OTHER): Payer: Medicare Other | Admitting: Family Medicine

## 2019-09-07 ENCOUNTER — Other Ambulatory Visit: Payer: Self-pay

## 2019-09-07 DIAGNOSIS — E782 Mixed hyperlipidemia: Secondary | ICD-10-CM

## 2019-09-07 DIAGNOSIS — R809 Proteinuria, unspecified: Secondary | ICD-10-CM | POA: Diagnosis not present

## 2019-09-07 DIAGNOSIS — E1129 Type 2 diabetes mellitus with other diabetic kidney complication: Secondary | ICD-10-CM

## 2019-09-08 LAB — COMPREHENSIVE METABOLIC PANEL
ALT: 30 IU/L (ref 0–44)
AST: 31 IU/L (ref 0–40)
Albumin/Globulin Ratio: 1.3 (ref 1.2–2.2)
Albumin: 4.4 g/dL (ref 3.8–4.8)
Alkaline Phosphatase: 100 IU/L (ref 48–121)
BUN/Creatinine Ratio: 12 (ref 10–24)
BUN: 16 mg/dL (ref 8–27)
Bilirubin Total: 0.8 mg/dL (ref 0.0–1.2)
CO2: 25 mmol/L (ref 20–29)
Calcium: 9.5 mg/dL (ref 8.6–10.2)
Chloride: 103 mmol/L (ref 96–106)
Creatinine, Ser: 1.39 mg/dL — ABNORMAL HIGH (ref 0.76–1.27)
GFR calc Af Amer: 61 mL/min/{1.73_m2} (ref >59)
GFR calc non Af Amer: 53 mL/min/{1.73_m2} — ABNORMAL LOW (ref >59)
Globulin, Total: 3.3 g/dL (ref 1.5–4.5)
Glucose: 147 mg/dL — ABNORMAL HIGH (ref 65–99)
Potassium: 4.7 mmol/L (ref 3.5–5.2)
Sodium: 141 mmol/L (ref 134–144)
Total Protein: 7.7 g/dL (ref 6.0–8.5)

## 2019-09-08 LAB — LIPID PANEL
Chol/HDL Ratio: 4.1 ratio (ref 0.0–5.0)
Cholesterol, Total: 141 mg/dL (ref 100–199)
HDL: 34 mg/dL — ABNORMAL LOW (ref >39)
LDL Chol Calc (NIH): 81 mg/dL (ref 0–99)
Triglycerides: 149 mg/dL (ref 0–149)
VLDL Cholesterol Cal: 26 mg/dL (ref 5–40)

## 2019-09-08 LAB — HEMOGLOBIN A1C
Est. average glucose Bld gHb Est-mCnc: 183 mg/dL
Hgb A1c MFr Bld: 8 % — ABNORMAL HIGH (ref 4.8–5.6)

## 2019-09-09 ENCOUNTER — Other Ambulatory Visit: Payer: Self-pay | Admitting: Family Medicine

## 2019-09-16 ENCOUNTER — Ambulatory Visit (INDEPENDENT_AMBULATORY_CARE_PROVIDER_SITE_OTHER): Payer: Medicare Other | Admitting: Family Medicine

## 2019-09-16 ENCOUNTER — Encounter: Payer: Self-pay | Admitting: Family Medicine

## 2019-09-16 ENCOUNTER — Other Ambulatory Visit: Payer: Self-pay

## 2019-09-16 VITALS — BP 129/77 | HR 82 | Temp 98.1°F | Ht 62.0 in | Wt 152.4 lb

## 2019-09-16 DIAGNOSIS — I10 Essential (primary) hypertension: Secondary | ICD-10-CM | POA: Diagnosis not present

## 2019-09-16 DIAGNOSIS — E1129 Type 2 diabetes mellitus with other diabetic kidney complication: Secondary | ICD-10-CM | POA: Diagnosis not present

## 2019-09-16 DIAGNOSIS — E785 Hyperlipidemia, unspecified: Secondary | ICD-10-CM | POA: Diagnosis not present

## 2019-09-16 DIAGNOSIS — Z23 Encounter for immunization: Secondary | ICD-10-CM | POA: Diagnosis not present

## 2019-09-16 DIAGNOSIS — R809 Proteinuria, unspecified: Secondary | ICD-10-CM | POA: Diagnosis not present

## 2019-09-16 MED ORDER — REPAGLINIDE 1 MG PO TABS: 1.0000 mg | ORAL_TABLET | Freq: Three times a day (TID) | ORAL | 1 refills | Status: DC

## 2019-09-16 MED ORDER — METFORMIN HCL ER 500 MG PO TB24
1000.0000 mg | ORAL_TABLET | Freq: Every day | ORAL | 1 refills | Status: DC
Start: 2019-09-16 — End: 2020-02-04

## 2019-09-16 MED ORDER — LOSARTAN POTASSIUM 25 MG PO TABS
25.0000 mg | ORAL_TABLET | Freq: Every day | ORAL | 1 refills | Status: DC
Start: 2019-09-16 — End: 2019-11-19

## 2019-09-16 NOTE — Progress Notes (Signed)
9/9/20218:52 AM  Jesse Baldwin [birth year], 64 y.o., male [id number]  Chief Complaint  Patient presents with  . Diabetes    says he is having trouble sticking to healthy diet. Monitors sugars daily  . Hyperlipidemia    HPI:   Patient is a 64 y.o. male with past medical history significant for DM2 with microalbuminuria, HTN, HLPwho presents today for routine followup  Last OV June 2021 - started [place]  Overall he is doing well and has no acute concerns today Labs from Sep 07 2019 reviewed He thought he was to stop his metformin, he is taking prandin and [place] He is not compliant with diet He checks cbgs fasting sporadically, 100-140s He denies any low cbgs He declines referral to CDE  Lab Results  Component Value Date   HGBA1C 8.0 (H) 09/07/2019   HGBA1C 7.8 (H) 06/04/2019   HGBA1C 9.7 (H) 03/02/2019   Lab Results  Component Value Date   MICROALBUR 23.4 11/06/2015   LDLCALC 81 09/07/2019   CREATININE 1.39 (H) 09/07/2019  GFR 53  Depression screen [facility] 2/9 09/16/2019 06/10/2019 02/03/2019  Decreased Interest 0 0 0  Down, Depressed, Hopeless 0 0 0  PHQ - 2 Score 0 0 0    Fall Risk  09/16/2019 06/10/2019 03/08/2019 02/03/2019 02/02/2019  Falls in the past year? 0 0 0 0 0  Number falls in past yr: 0 0 0 - 0  Injury with Fall? 0 0 0 - 0  Follow up - - Falls evaluation completed Falls evaluation completed Falls evaluation completed     Allergies  Allergen Reactions  . Septra [Bactrim] Nausea And Vomiting  . Sulfa Antibiotics Rash    Prior to Admission medications   Medication Sig Start Date End Date Taking? Authorizing Provider  acetaminophen (TYLENOL) 500 MG tablet Take 1,000 mg by mouth every 6 (six) hours as needed for moderate pain or fever.   Yes [provider]  latanoprost (XALATAN) 0.005 % ophthalmic solution Place 1 drop into both eyes at bedtime.    Yes [provider]  losartan (COZAAR) 25 MG tablet Take 1 tablet (25 mg total) by mouth  daily. 03/08/19  Yes Rutherford Guys, MD  metFORMIN (GLUCOPHAGE-XR) 500 MG 24 hr tablet TAKE 4 TABLETS BY MOUTH DAILY WITH BREAKFAST 03/02/19  Yes Rutherford Guys, MD  repaglinide (PRANDIN) 1 MG tablet Take 1 mg by mouth 3 (three) times daily. 05/06/19  Yes [provider]  rosuvastatin (CRESTOR) 40 MG tablet Take 1 tablet (40 mg total) by mouth daily. 03/08/19  Yes Rutherford Guys, MD  sitaGLIPtin (JANUVIA) 100 MG tablet Take 1 tablet (100 mg total) by mouth daily. 06/10/19  Yes Rutherford Guys, MD    Past Medical History:  Diagnosis Date  . Diabetes mellitus without complication ([place])   . Hyperlipidemia   . Hypertension     Past Surgical History:  Procedure Laterality Date  . CARPAL TUNNEL RELEASE Bilateral   . CATARACT EXTRACTION Right   . CHOLECYSTECTOMY  1990  . CYST REMOVAL NECK      Social History   Tobacco Use  . Smoking status: Former Smoker    Quit date: 04/07/1981    Years since quitting: 38.4  . Smokeless tobacco: Never Used  Substance Use Topics  . Alcohol use: No    Family History  Problem Relation Age of Onset  . Diabetes Mother   . Hypertension Mother   . Diabetes Father   . Hypertension Father   .  Diabetes Sister   . Hypertension Sister   . Cancer Sister 50       bone marrow cancer  . Diabetes Brother   . Hypertension Brother   . Colon cancer Neg Hx     Review of Systems  Constitutional: Negative for chills and fever.  Respiratory: Negative for cough and shortness of breath.   Cardiovascular: Negative for chest pain, palpitations and leg swelling.  Gastrointestinal: Negative for abdominal pain, nausea and vomiting.  Genitourinary: Negative for frequency and urgency.  Endo/Heme/Allergies: Negative for polydipsia.     OBJECTIVE:  Today's Vitals   09/16/19 0843  BP: 129/77  Pulse: 82  Temp: 98.1 F (36.7 C)  SpO2: 98%  Weight: 152 lb 6.4 oz (69.1 kg)  Height: 5\' 2"  (1.575 m)   Body mass index is 27.87 kg/m.   Physical  Exam Vitals and nursing note reviewed.  Constitutional:      Appearance: He is well-developed.  HENT:     Head: Normocephalic and atraumatic.  Eyes:     Extraocular Movements: Extraocular movements intact.     Conjunctiva/sclera: Conjunctivae normal.     Pupils: Pupils are equal, round, and reactive to light.  Cardiovascular:     Rate and Rhythm: Normal rate and regular rhythm.     Heart sounds: No murmur heard.  No friction rub. No gallop.   Pulmonary:     Effort: Pulmonary effort is normal.     Breath sounds: Normal breath sounds. No wheezing, rhonchi or rales.  Musculoskeletal:     Cervical back: Neck supple.  Skin:    General: Skin is warm and dry.  Neurological:     Mental Status: He is alert and oriented to person, place, and time.     No results found for this or any previous visit (from the past 24 hour(s)).  No results found.   ASSESSMENT and PLAN  1. Type 2 diabetes mellitus with microalbuminuria, without long-term current use of insulin (HCC) Not at goal, not compliant with diet. Restart metformin, cont prandin and [place]. GI intolerance to trulicity. UTI with farxiga. On ARB. - Hemoglobin A1c; Future - Basic metabolic panel; Future  2. Essential hypertension At goal, < 130/80, cont losartan  3. Hyperlipidemia, unspecified hyperlipidemia type Controlled. Continue current regime.   4. Need for influenza vaccination - Flu Vaccine QUAD High Dose(Fluad)  Other orders - metFORMIN (GLUCOPHAGE-XR) 500 MG 24 hr tablet; Take 2 tablets (1,000 mg total) by mouth daily with breakfast. - losartan (COZAAR) 25 MG tablet; Take 1 tablet (25 mg total) by mouth daily. - repaglinide (PRANDIN) 1 MG tablet; Take 1 tablet (1 mg total) by mouth 3 (three) times daily.  Return in about 3 months (around 12/16/2019).    Rutherford Guys, MD Primary Care at [place] [place], [place] [address] Ph.  [phone] Fax [phone]

## 2019-09-16 NOTE — Patient Instructions (Addendum)
°  Start metformin, continue prandin and [place] Work on [employer]   If you have lab work done today you will be contacted with your lab results within the next 2 weeks.  If you have not heard from [place] then please contact us. The fastest way to get your results is to register for My Chart.   IF you received an x-ray today, you will receive an invoice from [facility]. Please contact [facility] at [phone] with questions or concerns regarding your invoice.   IF you received labwork today, you will receive an invoice from [facility]. Please contact [facility] at [phone] with questions or concerns regarding your invoice.   Our billing staff will not be able to assist you with questions regarding bills from these companies.  You will be contacted with the lab results as soon as they are available. The fastest way to get your results is to activate your My Chart account. Instructions are located on the last page of this paperwork. If you have not heard from [place] regarding the results in 2 weeks, please contact this office.

## 2019-11-17 ENCOUNTER — Encounter: Payer: Self-pay | Admitting: Family Medicine

## 2019-11-19 ENCOUNTER — Ambulatory Visit (AMBULATORY_SURGERY_CENTER): Payer: Self-pay | Admitting: *Deleted

## 2019-11-19 ENCOUNTER — Other Ambulatory Visit: Payer: Self-pay

## 2019-11-19 VITALS — Ht 62.0 in | Wt 150.0 lb

## 2019-11-19 DIAGNOSIS — Z86010 Personal history of colonic polyps: Secondary | ICD-10-CM

## 2019-11-19 MED ORDER — SUPREP BOWEL PREP KIT 17.5-3.13-1.6 GM/177ML PO SOLN: 1.0000 | Freq: Once | ORAL | 0 refills | Status: AC

## 2019-11-19 NOTE — Progress Notes (Signed)
cov vax x 2- booster 11-18-19   No egg or soy allergy known to patient  No issues with past sedation with any surgeries or procedures no intubation problems in the past  No FH of Malignant Hyperthermia No diet pills per patient No home 02 use per patient  No blood thinners per patient  Pt denies issues with constipation  No A fib or A flutter  EMMI video to pt or via [place] 19 guidelines implemented in PV today with Pt and RN    Due to the COVID-19 pandemic we are asking patients to follow these guidelines. Please only bring one care partner. Please be aware that your care partner may wait in the car in the parking lot or if they feel like they will be too hot to wait in the car, they may wait in the lobby on the 4th floor. All care partners are required to wear a mask the entire time (we do not have any that we can provide them), they need to practice social distancing, and we will do a Covid check for all patient's and care partners when you arrive. Also we will check their temperature and your temperature. If the care partner waits in their car they need to stay in the parking lot the entire time and we will call them on their cell phone when the patient is ready for discharge so they can bring the car to the front of the building. Also all patient's will need to wear a mask into building.

## 2019-11-22 ENCOUNTER — Telehealth: Payer: Self-pay | Admitting: Gastroenterology

## 2019-11-22 NOTE — Telephone Encounter (Signed)
Patient refusing to pay $40 for the Suprep. I explained that is the one his dr likes to use. He wants cheaper prep, I offered the [place], he is going to call his [employer] insurance to see if that's covered and he will call us back.

## 2019-11-24 ENCOUNTER — Telehealth: Payer: Self-pay | Admitting: Gastroenterology

## 2019-11-24 NOTE — Telephone Encounter (Signed)
Hi Dr. Loletha Carrow, pt called to cancel his procedure that was scheduled with you 12/01/19 because he will be out of town that day. He will call back to rescheduled. Thank you.

## 2019-11-24 NOTE — Telephone Encounter (Signed)
Reminder in epic °

## 2019-11-24 NOTE — Telephone Encounter (Signed)
Please contact the patient in 3 to 4 weeks to reschedule procedure if we do not hear from him before then.

## 2019-12-01 ENCOUNTER — Encounter: Payer: Medicare Other | Admitting: Gastroenterology

## 2019-12-15 ENCOUNTER — Telehealth: Payer: Self-pay

## 2019-12-15 NOTE — Telephone Encounter (Signed)
Lm on vm for patient to return call.   At this time patient has not rescheduled.

## 2019-12-15 NOTE — Telephone Encounter (Signed)
-----   Message from Yevette Edwards, RN sent at 11/24/2019  4:28 PM EST ----- Regarding: Reschedule colon Contact patient to reschedule colonoscopy in the [place] if not already rescheduled.

## 2019-12-16 NOTE — Telephone Encounter (Signed)
Lm on vm for patient to return call 

## 2019-12-22 NOTE — Telephone Encounter (Signed)
Letter mailed for patient to call the office to reschedule colonoscopy.

## 2020-01-04 ENCOUNTER — Telehealth: Payer: Self-pay | Admitting: *Deleted

## 2020-01-04 ENCOUNTER — Telehealth: Payer: Self-pay | Admitting: Family Medicine

## 2020-01-04 NOTE — Telephone Encounter (Signed)
Pt calling back to sch AWV Please advise.

## 2020-01-04 NOTE — Telephone Encounter (Signed)
Schedule AWV.  

## 2020-02-02 ENCOUNTER — Encounter: Payer: Medicare Other | Admitting: Registered Nurse

## 2020-02-03 ENCOUNTER — Encounter: Payer: Self-pay | Admitting: Registered Nurse

## 2020-02-04 ENCOUNTER — Encounter: Payer: Self-pay | Admitting: Family Medicine

## 2020-02-04 ENCOUNTER — Ambulatory Visit (INDEPENDENT_AMBULATORY_CARE_PROVIDER_SITE_OTHER): Payer: Medicare Other | Admitting: Family Medicine

## 2020-02-04 ENCOUNTER — Other Ambulatory Visit: Payer: Self-pay

## 2020-02-04 VITALS — BP 128/69 | HR 89 | Temp 98.1°F | Resp 18 | Ht 62.0 in | Wt 140.2 lb

## 2020-02-04 DIAGNOSIS — E1129 Type 2 diabetes mellitus with other diabetic kidney complication: Secondary | ICD-10-CM

## 2020-02-04 DIAGNOSIS — E785 Hyperlipidemia, unspecified: Secondary | ICD-10-CM

## 2020-02-04 DIAGNOSIS — I10 Essential (primary) hypertension: Secondary | ICD-10-CM | POA: Diagnosis not present

## 2020-02-04 DIAGNOSIS — R809 Proteinuria, unspecified: Secondary | ICD-10-CM

## 2020-02-04 MED ORDER — JARDIANCE 25 MG PO TABS: 25.0000 mg | ORAL_TABLET | Freq: Once | ORAL | 13 refills | Status: AC

## 2020-02-04 MED ORDER — METFORMIN HCL ER 500 MG PO TB24: 1000.0000 mg | ORAL_TABLET | Freq: Every day | ORAL | 1 refills | Status: DC

## 2020-02-04 MED ORDER — REPAGLINIDE 1 MG PO TABS: 1.0000 mg | ORAL_TABLET | Freq: Three times a day (TID) | ORAL | 1 refills | Status: DC

## 2020-02-04 MED ORDER — AMLODIPINE BESYLATE 5 MG PO TABS: 5.0000 mg | ORAL_TABLET | Freq: Every day | ORAL | 1 refills | Status: DC

## 2020-02-04 MED ORDER — ROSUVASTATIN CALCIUM 40 MG PO TABS: 40.0000 mg | ORAL_TABLET | Freq: Every day | ORAL | 3 refills | Status: DC

## 2020-02-04 NOTE — Progress Notes (Signed)
Patient ID: Jesse Baldwin, male    DOB: 09/06/1955  Age: 65 y.o. MRN: [id number]  Chief Complaint  Patient presents with  . Medication Refill    Patient states he is here for a medication refill on Jardiance and Rosuvastatin. Per patient he has no other concerns    Subjective:   Patient is here for recheck with regard to his diabetes and blood pressure and refill on his medications.  His previous physician, Dr. Pamella Pert, has left his practice.  This practice is closing in March so he is going to have to find a new doctor.  He feels well.  He is retired.  He says his biggest problem is that he does not watch his eating well.  He gets exercise.  He is not having any other major symptoms.  He does have nocturia fairly often, possibly a combination of age and diabetes.  He does not take his Metformin regularly because it bothers his stomach.  He needs refills on the other meds.  Current allergies, medications, problem list, past/family and social histories reviewed.  Objective:  BP 128/69   Pulse 89   Temp 98.1 F (36.7 C) (Temporal)   Resp 18   Ht 5\' 2"  (1.575 m)   Wt 140 lb 3.2 oz (63.6 kg)   SpO2 98%   BMI 25.64 kg/m   No major acute distress.  Alert and oriented.  No thyromegaly.  Chest clear.  Heart regular without murmurs.  Abdomen soft without mass or tenderness.  Assessment & Plan:   Assessment: 1. Type 2 diabetes mellitus with microalbuminuria, without long-term current use of insulin ([place])   2. Essential hypertension   3. Hyperlipidemia, unspecified hyperlipidemia type       Plan: See instructions  Orders Placed This Encounter  Procedures  . Hemoglobin A1c  . Comprehensive metabolic panel  . Lipid panel    Meds ordered this encounter  Medications  . amLODipine (NORVASC) 5 MG tablet    Sig: Take 1 tablet (5 mg total) by mouth daily.    Dispense:  90 tablet    Refill:  1  . JARDIANCE 25 MG TABS tablet    Sig: Take 1 tablet (25 mg total) by mouth once for  1 dose.    Dispense:  90 tablet    Refill:  13  . metFORMIN (GLUCOPHAGE-XR) 500 MG 24 hr tablet    Sig: Take 2 tablets (1,000 mg total) by mouth daily with breakfast.    Dispense:  180 tablet    Refill:  1  . repaglinide (PRANDIN) 1 MG tablet    Sig: Take 1 tablet (1 mg total) by mouth 3 (three) times daily.    Dispense:  270 tablet    Refill:  1  . rosuvastatin (CRESTOR) 40 MG tablet    Sig: Take 1 tablet (40 mg total) by mouth daily.    Dispense:  90 tablet    Refill:  3         Patient Instructions     Try to watch her diet and get regular exercise  Continue to monitor your blood sugars.  We will let you know the results of your labs in a week or so.  I have given refills on your medications.  However you need to find yourself a new primary care doctor in about 4 months or so to follow-up on things.  As I told you, this practice is closing late March.  If you have lab work done today  you will be contacted with your lab results within the next 2 weeks.  If you have not heard from [place] then please contact us. The fastest way to get your results is to register for My Chart.   IF you received an x-ray today, you will receive an invoice from [facility]. Please contact [facility] at [phone] with questions or concerns regarding your invoice.   IF you received labwork today, you will receive an invoice from [facility]. Please contact [facility] at [phone] with questions or concerns regarding your invoice.   Our billing staff will not be able to assist you with questions regarding bills from these companies.  You will be contacted with the lab results as soon as they are available. The fastest way to get your results is to activate your My Chart account. Instructions are located on the last page of this paperwork. If you have not heard from [place] regarding the results in 2 weeks, please contact this office.         Return if symptoms worsen or fail  to improve.   Ruben Reason, MD 02/04/2020

## 2020-02-04 NOTE — Patient Instructions (Addendum)
   Try to watch her diet and get regular exercise  Continue to monitor your blood sugars.  We will let you know the results of your labs in a week or so.  I have given refills on your medications.  However you need to find yourself a new primary care doctor in about 4 months or so to follow-up on things.  As I told you, this practice is closing late March.  If you have lab work done today you will be contacted with your lab results within the next 2 weeks.  If you have not heard from [place] then please contact us. The fastest way to get your results is to register for My Chart.   IF you received an x-ray today, you will receive an invoice from [facility]. Please contact [facility] at [phone] with questions or concerns regarding your invoice.   IF you received labwork today, you will receive an invoice from [place]. Please contact [facility] at [phone] with questions or concerns regarding your invoice.   Our billing staff will not be able to assist you with questions regarding bills from these companies.  You will be contacted with the lab results as soon as they are available. The fastest way to get your results is to activate your My Chart account. Instructions are located on the last page of this paperwork. If you have not heard from [place] regarding the results in 2 weeks, please contact this office.

## 2020-02-05 LAB — LIPID PANEL
Chol/HDL Ratio: 3.9 ratio (ref 0.0–5.0)
Cholesterol, Total: 170 mg/dL (ref 100–199)
HDL: 44 mg/dL (ref >39)
LDL Chol Calc (NIH): 94 mg/dL (ref 0–99)
Triglycerides: 185 mg/dL — ABNORMAL HIGH (ref 0–149)
VLDL Cholesterol Cal: 32 mg/dL (ref 5–40)

## 2020-02-05 LAB — COMPREHENSIVE METABOLIC PANEL
ALT: 37 IU/L (ref 0–44)
AST: 35 IU/L (ref 0–40)
Albumin/Globulin Ratio: 1.3 (ref 1.2–2.2)
Albumin: 4.5 g/dL (ref 3.8–4.8)
Alkaline Phosphatase: 111 IU/L (ref 44–121)
BUN/Creatinine Ratio: 18 (ref 10–24)
BUN: 27 mg/dL (ref 8–27)
Bilirubin Total: 0.4 mg/dL (ref 0.0–1.2)
CO2: 23 mmol/L (ref 20–29)
Calcium: 9.8 mg/dL (ref 8.6–10.2)
Chloride: 107 mmol/L — ABNORMAL HIGH (ref 96–106)
Creatinine, Ser: 1.51 mg/dL — ABNORMAL HIGH (ref 0.76–1.27)
GFR calc Af Amer: 55 mL/min/{1.73_m2} — ABNORMAL LOW (ref >59)
GFR calc non Af Amer: 47 mL/min/{1.73_m2} — ABNORMAL LOW (ref >59)
Globulin, Total: 3.4 g/dL (ref 1.5–4.5)
Glucose: 131 mg/dL — ABNORMAL HIGH (ref 65–99)
Potassium: 4.1 mmol/L (ref 3.5–5.2)
Sodium: 143 mmol/L (ref 134–144)
Total Protein: 7.9 g/dL (ref 6.0–8.5)

## 2020-02-05 LAB — HEMOGLOBIN A1C
Est. average glucose Bld gHb Est-mCnc: 229 mg/dL
Hgb A1c MFr Bld: 9.6 % — ABNORMAL HIGH (ref 4.8–5.6)

## 2020-02-06 NOTE — Progress Notes (Signed)
Call:  The diabetes is in poor control, with the A1C up from 8 last time to 9.6.  This is from allowing the medicines to run out.  It is important to faithfully take the medicine and to watch your diet and eat better.  The kidney function is borderline, probably due to the diabetes.   Urge drinking lots of water.  Return to see a doctor for a recheck in 3-4 months.  Take all your medicines with you to that visit.   [facility] is closing, so you need to start working on finding another family physician.  Fenton Malling. Linna Darner MD

## 2020-03-03 ENCOUNTER — Ambulatory Visit: Payer: Medicare Other | Admitting: Family

## 2020-03-07 ENCOUNTER — Encounter: Payer: Self-pay | Admitting: Family Medicine

## 2020-03-07 ENCOUNTER — Other Ambulatory Visit: Payer: Self-pay | Admitting: *Deleted

## 2020-03-07 ENCOUNTER — Other Ambulatory Visit: Payer: Self-pay

## 2020-03-07 ENCOUNTER — Ambulatory Visit (INDEPENDENT_AMBULATORY_CARE_PROVIDER_SITE_OTHER): Payer: Medicare Other | Admitting: Family Medicine

## 2020-03-07 VITALS — BP 142/88 | HR 79 | Temp 97.5°F | Ht 62.0 in | Wt 142.0 lb

## 2020-03-07 DIAGNOSIS — I10 Essential (primary) hypertension: Secondary | ICD-10-CM

## 2020-03-07 DIAGNOSIS — E1129 Type 2 diabetes mellitus with other diabetic kidney complication: Secondary | ICD-10-CM

## 2020-03-07 DIAGNOSIS — E785 Hyperlipidemia, unspecified: Secondary | ICD-10-CM

## 2020-03-07 DIAGNOSIS — R809 Proteinuria, unspecified: Secondary | ICD-10-CM

## 2020-03-07 NOTE — Progress Notes (Signed)
Provider: Lillette Boxer. Sabra Heck, [place], [place] Office [phone]   Code Status: full Goals of Care:  Advanced Directives 02/08/2019  Does Patient Have a Medical Advance Directive? No  Type of Advance Directive -  Would patient like information on creating a medical advance directive? No - Patient declined     Chief Complaint  Patient presents with  . Establish Care    NP to establish    HPI: Patient is a 65 y.o. male seen today for medical management of chronic diseases.   Has been seen at [facility] but facility is closing and needing a PCP. Generally healthy. Retired from post off ice.  Moved to this country from [place] at age 89.  Had Covid Jan, 2020 and hospitalized for 3 days; still has some loss of smell. Has Type 2 DM for about 5 years.  Last A1C in Jan was 9.6.  Admits to poor diet with sweets and rice(carbs). Also has hypertension; had been tried on ACE/ARB but had side effects.  Also with increased lipids; takes statin and tolerated.    Past Medical History:  Diagnosis Date  . Allergy   . Arthritis    hands, back   . Cataract    removed right eye   . COVID-19    casirivimab\imdevimab infusions   . Diabetes mellitus without complication ([place])   . History of colonoscopy   . Hyperlipidemia   . Hypertension     Past Surgical History:  Procedure Laterality Date  . CARPAL TUNNEL RELEASE Bilateral   . CATARACT EXTRACTION Right   . CHOLECYSTECTOMY  1990  . COLONOSCOPY    . CYST REMOVAL NECK    . POLYPECTOMY      Allergies  Allergen Reactions  . Losartan     Other reaction(s): Cough  . Septra [Bactrim] Nausea And Vomiting  . Trulicity [Dulaglutide]     diarrhea  . Metformin Diarrhea  . Sulfa Antibiotics Rash and Nausea And Vomiting    Outpatient Encounter Medications as of 03/07/2020  Medication Sig  . amLODipine (NORVASC) 5 MG tablet Take 1 tablet (5 mg total) by mouth daily.  . empagliflozin  (JARDIANCE) 25 MG TABS tablet Take 25 mg by mouth daily.  Marland Kitchen latanoprost (XALATAN) 0.005 % ophthalmic solution Place 1 drop into both eyes at bedtime.  . repaglinide (PRANDIN) 1 MG tablet Take 1 mg by mouth 2 (two) times daily before a meal.  . rosuvastatin (CRESTOR) 40 MG tablet Take 1 tablet (40 mg total) by mouth daily.   Facility-Administered Encounter Medications as of 03/07/2020  Medication  . albuterol (VENTOLIN HFA) 108 (90 Base) MCG/ACT inhaler 2 puff    Review of Systems:  Review of Systems  Eyes: Negative.   Respiratory: Negative.   Cardiovascular: Negative.   Gastrointestinal: Negative.   Endocrine: Negative.   Musculoskeletal: Negative.   Allergic/Immunologic: Negative.   Neurological: Negative.   Hematological: Negative.   Psychiatric/Behavioral: Negative.   All other systems reviewed and are negative.   Health Maintenance  Topic Date Due  . OPHTHALMOLOGY EXAM  10/28/2018  . URINE MICROALBUMIN  03/01/2020  . COLONOSCOPY (Pts 45-59yrs Insurance coverage will need to be confirmed)  02/03/2021 (Originally 04/11/2019)  . TETANUS/TDAP  02/03/2021 (Originally 01/09/2020)  . PNA vac Low Risk Adult (1 of 2 - PCV13) 02/03/2021 (Originally 11/18/2018)  . FOOT EXAM  06/09/2020  . HEMOGLOBIN A1C  08/03/2020  . INFLUENZA VACCINE  Completed  . COVID-19 Vaccine  Completed  . Hepatitis C Screening  Completed  . HPV VACCINES  Aged Out    Physical Exam: Vitals:   03/07/20 1439  BP: (!) 142/88  Pulse: 79  Temp: (!) 97.5 F (36.4 C)  TempSrc: Skin  SpO2: 98%  Weight: 142 lb (64.4 kg)  Height: 5\' 2"  (1.575 m)   Body mass index is 25.97 kg/m. Physical Exam Vitals and nursing note reviewed.  Constitutional:      Appearance: Normal appearance. He is normal weight.  HENT:     Head: Normocephalic.     Right Ear: Tympanic membrane normal.     Left Ear: Tympanic membrane normal.     Nose: Nose normal.  Eyes:     Extraocular Movements: Extraocular movements intact.      Conjunctiva/sclera: Conjunctivae normal.     Pupils: Pupils are equal, round, and reactive to light.  Cardiovascular:     Rate and Rhythm: Normal rate and regular rhythm.     Pulses: Normal pulses.  Pulmonary:     Effort: Pulmonary effort is normal.     Breath sounds: Normal breath sounds.  Abdominal:     General: Abdomen is flat. Bowel sounds are normal.  Musculoskeletal:        General: Normal range of motion.     Cervical back: Normal range of motion.  Skin:    General: Skin is warm and dry.     Findings: No rash.  Neurological:     General: No focal deficit present.     Mental Status: He is alert and oriented to person, place, and time.  Psychiatric:        Mood and Affect: Mood normal.        Behavior: Behavior normal.        Thought Content: Thought content normal.        Judgment: Judgment normal.     Labs reviewed: Basic Metabolic Panel: Recent Labs    06/04/19 0923 09/07/19 0805 02/04/20 1637  NA 139 141 143  K 4.8 4.7 4.1  CL 103 103 107*  CO2 21 25 23   GLUCOSE 153* 147* 131*  BUN 25 16 27   CREATININE 1.34* 1.39* 1.51*  CALCIUM 9.6 9.5 9.8   Liver Function Tests: Recent Labs    06/04/19 0923 09/07/19 0805 02/04/20 1637  AST 32 31 35  ALT 38 30 37  ALKPHOS 103 100 111  BILITOT 0.6 0.8 0.4  PROT 7.6 7.7 7.9  ALBUMIN 4.3 4.4 4.5   No results for input(s): LIPASE, AMYLASE in the last 8760 hours. No results for input(s): AMMONIA in the last 8760 hours. CBC: No results for input(s): WBC, NEUTROABS, HGB, HCT, MCV, PLT in the last 8760 hours. Lipid Panel: Recent Labs    06/04/19 0923 09/07/19 0805 02/04/20 1637  CHOL 136 141 170  HDL 32* 34* 44  LDLCALC 80 81 94  TRIG 137 149 185*  CHOLHDL 4.3 4.1 3.9   Lab Results  Component Value Date   HGBA1C 9.6 (H) 02/04/2020    Procedures since last visit: No results found.  Assessment/Plan 1. Primary hypertension BP controlled for now; would like him to take ARB. Consider later time  2. Type  2 diabetes mellitus with microalbuminuria, without long-term current use of insulin ([facility]) A1C about 1 month ago was 9/6  Will continue same meds for now.  Sounds like he has insight as to what it will take to control sugars; just has to do it  3. Hyperlipidemia, unspecified  hyperlipidemia type On Crestor with LDL <100. Continue same    Labs/tests ordered:  A1C,BMP,u/a Next appt:  04/28/2020 medical management of chronic issues

## 2020-03-10 ENCOUNTER — Ambulatory Visit: Payer: Medicare Other | Admitting: Family Medicine

## 2020-04-28 ENCOUNTER — Other Ambulatory Visit: Payer: Self-pay

## 2020-04-28 ENCOUNTER — Other Ambulatory Visit: Payer: Medicare Other

## 2020-04-28 DIAGNOSIS — E785 Hyperlipidemia, unspecified: Secondary | ICD-10-CM | POA: Diagnosis not present

## 2020-04-28 DIAGNOSIS — E1129 Type 2 diabetes mellitus with other diabetic kidney complication: Secondary | ICD-10-CM | POA: Diagnosis not present

## 2020-04-28 DIAGNOSIS — I10 Essential (primary) hypertension: Secondary | ICD-10-CM | POA: Diagnosis not present

## 2020-04-28 DIAGNOSIS — R809 Proteinuria, unspecified: Secondary | ICD-10-CM | POA: Diagnosis not present

## 2020-04-29 LAB — BASIC METABOLIC PANEL
BUN: 24 mg/dL (ref 7–25)
CO2: 30 mmol/L (ref 20–32)
Calcium: 9.7 mg/dL (ref 8.6–10.3)
Chloride: 103 mmol/L (ref 98–110)
Creat: 1.19 mg/dL (ref 0.70–1.25)
Glucose, Bld: 133 mg/dL — ABNORMAL HIGH (ref 65–99)
Potassium: 4.2 mmol/L (ref 3.5–5.3)
Sodium: 140 mmol/L (ref 135–146)

## 2020-04-29 LAB — URINALYSIS
Bilirubin Urine: NEGATIVE
Hgb urine dipstick: NEGATIVE
Ketones, ur: NEGATIVE
Leukocytes,Ua: NEGATIVE
Nitrite: NEGATIVE
Specific Gravity, Urine: 1.029 (ref 1.001–1.035)
pH: <=5 (ref 5.0–8.0)

## 2020-04-29 LAB — HEMOGLOBIN A1C
Hgb A1c MFr Bld: 7.9 % of total Hgb — ABNORMAL HIGH (ref <5.7)
Mean Plasma Glucose: 180 mg/dL
eAG (mmol/L): 10 mmol/L

## 2020-05-03 ENCOUNTER — Ambulatory Visit (INDEPENDENT_AMBULATORY_CARE_PROVIDER_SITE_OTHER): Payer: Medicare Other | Admitting: Family Medicine

## 2020-05-03 ENCOUNTER — Other Ambulatory Visit: Payer: Self-pay

## 2020-05-03 ENCOUNTER — Encounter: Payer: Self-pay | Admitting: Family Medicine

## 2020-05-03 VITALS — BP 130/60 | HR 103 | Temp 97.8°F | Resp 16 | Ht 62.0 in | Wt 139.8 lb

## 2020-05-03 DIAGNOSIS — E1129 Type 2 diabetes mellitus with other diabetic kidney complication: Secondary | ICD-10-CM | POA: Diagnosis not present

## 2020-05-03 DIAGNOSIS — I10 Essential (primary) hypertension: Secondary | ICD-10-CM | POA: Diagnosis not present

## 2020-05-03 DIAGNOSIS — R809 Proteinuria, unspecified: Secondary | ICD-10-CM

## 2020-05-03 NOTE — Progress Notes (Signed)
Provider:  Alain Honey, MD  Careteam: Jesse Baldwin Care Team: Wardell Honour, MD as PCP - General (Family Medicine)  PLACE OF SERVICE:  [place] Directive information Does Jesse Baldwin Have a Medical Advance Directive?: No, Would Jesse Baldwin like information on creating a medical advance directive?: No - Jesse Baldwin declined  Allergies  Allergen Reactions  . Losartan     Other reaction(s): Cough  . Septra [Bactrim] Nausea And Vomiting  . Trulicity [Dulaglutide]     diarrhea  . Metformin Diarrhea  . Sulfa Antibiotics Rash and Nausea And Vomiting    Chief Complaint  Jesse Baldwin presents with  . Medical Management of Chronic Issues    1 month Follow Up  . Health Maintenance    Discuss the need for Eye exam, and Urine microalbumin.     HPI: Jesse Baldwin is a 65 y.o. male   Review of Systems:  ROS  Past Medical History:  Diagnosis Date  . Allergy   . Arthritis    hands, back   . Cataract    removed right eye   . COVID-19    casirivimab\imdevimab infusions   . Diabetes mellitus without complication ([place])   . History of colonoscopy   . Hyperlipidemia   . Hypertension    Past Surgical History:  Procedure Laterality Date  . CARPAL TUNNEL RELEASE Bilateral   . CATARACT EXTRACTION Right   . CHOLECYSTECTOMY  1990  . COLONOSCOPY    . CYST REMOVAL NECK    . POLYPECTOMY     Social History:   reports that he quit smoking about 39 years ago. He has never used smokeless tobacco. He reports that he does not drink alcohol and does not use drugs.  Family History  Problem Relation Age of Onset  . Diabetes Mother 48  . Hypertension Mother   . Diabetes Father 34  . Hypertension Father   . Colon cancer Neg Hx   . Colon polyps Neg Hx   . Esophageal cancer Neg Hx   . Rectal cancer Neg Hx   . Stomach cancer Neg Hx     Medications: Jesse Baldwin's Medications  New Prescriptions   No medications on file  Previous Medications   AMLODIPINE (NORVASC) 5 MG TABLET    Take 1  tablet (5 mg total) by mouth daily.   EMPAGLIFLOZIN (JARDIANCE) 25 MG TABS TABLET    Take 25 mg by mouth daily.   LATANOPROST (XALATAN) 0.005 % OPHTHALMIC SOLUTION    Place 1 drop into both eyes at bedtime.   REPAGLINIDE (PRANDIN) 1 MG TABLET    Take 1 mg by mouth 2 (two) times daily before a meal.   ROSUVASTATIN (CRESTOR) 40 MG TABLET    Take 1 tablet (40 mg total) by mouth daily.  Modified Medications   No medications on file  Discontinued Medications   No medications on file    Physical Exam:  Vitals:   05/03/20 0826  BP: 130/60  Pulse: (!) 103  Resp: 16  Temp: 97.8 F (36.6 C)  SpO2: 97%  Weight: 139 lb 12.8 oz (63.4 kg)  Height: 5\' 2"  (1.575 m)   Body mass index is 25.57 kg/m. Wt Readings from Last 3 Encounters:  05/03/20 139 lb 12.8 oz (63.4 kg)  03/07/20 142 lb (64.4 kg)  02/04/20 140 lb 3.2 oz (63.6 kg)    Physical Exam  Labs reviewed: Basic Metabolic Panel: Recent Labs    09/07/19 0805 02/04/20 1637 04/28/20 0806  NA 141 143 140  K 4.7 4.1 4.2  CL 103 107* 103  CO2 25 23 30   GLUCOSE 147* 131* 133*  BUN 16 27 24   CREATININE 1.39* 1.51* 1.19  CALCIUM 9.5 9.8 9.7   Liver Function Tests: Recent Labs    06/04/19 0923 09/07/19 0805 02/04/20 1637  AST 32 31 35  ALT 38 30 37  ALKPHOS 103 100 111  BILITOT 0.6 0.8 0.4  PROT 7.6 7.7 7.9  ALBUMIN 4.3 4.4 4.5   No results for input(s): LIPASE, AMYLASE in the last 8760 hours. No results for input(s): AMMONIA in the last 8760 hours. CBC: No results for input(s): WBC, NEUTROABS, HGB, HCT, MCV, PLT in the last 8760 hours. Lipid Panel: Recent Labs    06/04/19 0923 09/07/19 0805 02/04/20 1637  CHOL 136 141 170  HDL 32* 34* 44  LDLCALC 80 81 94  TRIG 137 149 185*  CHOLHDL 4.3 4.1 3.9   TSH: No results for input(s): TSH in the last 8760 hours. A1C: Lab Results  Component Value Date   HGBA1C 7.9 (H) 04/28/2020     Assessment/Plan   1. Type 2 diabetes mellitus with microalbuminuria, without  long-term current use of insulin ([place]) Jesse Baldwin had shown some protein in his urine at last check 1 year ago.  Renal function, BUN and creatinine are within normal limits.  A1c has decreased since last check 4 months ago but he admits he will he still likes to eat rice 3 times a day and I think that is his major downfall.  We will plan to increase Jardiance from 1/2 pill a day to a whole pill which is 25 mg - Microalbumin/Creatinine Ratio, Urine  2. Primary hypertension Blood pressure is good at 130/68.  Continue with amlodipine 5 mg  Alain Honey, MD [place] [phone]

## 2020-05-03 NOTE — Patient Instructions (Signed)
Continue to try and decrease carbohydrate intake, especially rice.  Eat more vegetables and meats like chicken and fish. Increase Jardiance to 25 mg/day

## 2020-05-04 LAB — MICROALBUMIN / CREATININE URINE RATIO
Creatinine, Urine: 27 mg/dL (ref 20–320)
Microalb Creat Ratio: 156 mcg/mg creat — ABNORMAL HIGH (ref <30)
Microalb, Ur: 4.2 mg/dL

## 2020-08-15 ENCOUNTER — Other Ambulatory Visit: Payer: Self-pay | Admitting: Family Medicine

## 2020-08-15 DIAGNOSIS — E1129 Type 2 diabetes mellitus with other diabetic kidney complication: Secondary | ICD-10-CM

## 2020-08-25 ENCOUNTER — Other Ambulatory Visit: Payer: Medicare Other

## 2020-08-25 ENCOUNTER — Other Ambulatory Visit: Payer: Self-pay

## 2020-08-25 DIAGNOSIS — R809 Proteinuria, unspecified: Secondary | ICD-10-CM

## 2020-08-25 DIAGNOSIS — E1129 Type 2 diabetes mellitus with other diabetic kidney complication: Secondary | ICD-10-CM

## 2020-08-25 LAB — BASIC METABOLIC PANEL
BUN/Creatinine Ratio: 22 (calc) (ref 6–22)
BUN: 28 mg/dL — ABNORMAL HIGH (ref 7–25)
CO2: 32 mmol/L (ref 20–32)
Calcium: 9.9 mg/dL (ref 8.6–10.3)
Chloride: 101 mmol/L (ref 98–110)
Creat: 1.3 mg/dL (ref 0.70–1.35)
Glucose, Bld: 129 mg/dL — ABNORMAL HIGH (ref 65–99)
Potassium: 4.8 mmol/L (ref 3.5–5.3)
Sodium: 139 mmol/L (ref 135–146)

## 2020-08-30 ENCOUNTER — Ambulatory Visit: Payer: Medicare Other | Admitting: Family Medicine

## 2020-09-05 ENCOUNTER — Encounter: Payer: Self-pay | Admitting: Family Medicine

## 2020-09-05 ENCOUNTER — Ambulatory Visit (INDEPENDENT_AMBULATORY_CARE_PROVIDER_SITE_OTHER): Payer: Medicare Other | Admitting: Family Medicine

## 2020-09-05 ENCOUNTER — Other Ambulatory Visit: Payer: Self-pay

## 2020-09-05 VITALS — BP 122/72 | HR 79 | Temp 97.7°F | Ht 62.0 in | Wt 136.4 lb

## 2020-09-05 DIAGNOSIS — N401 Enlarged prostate with lower urinary tract symptoms: Secondary | ICD-10-CM

## 2020-09-05 DIAGNOSIS — E785 Hyperlipidemia, unspecified: Secondary | ICD-10-CM | POA: Diagnosis not present

## 2020-09-05 DIAGNOSIS — E1129 Type 2 diabetes mellitus with other diabetic kidney complication: Secondary | ICD-10-CM

## 2020-09-05 DIAGNOSIS — R351 Nocturia: Secondary | ICD-10-CM

## 2020-09-05 DIAGNOSIS — R809 Proteinuria, unspecified: Secondary | ICD-10-CM | POA: Diagnosis not present

## 2020-09-05 NOTE — Progress Notes (Signed)
Provider:  Alain Honey, MD  Careteam: Patient Care Team: Wardell Honour, MD as PCP - General (Family Medicine)  PLACE OF SERVICE:  [place]  Advanced Directive information    Allergies  Allergen Reactions   Losartan     Other reaction(s): Cough   Septra [Bactrim] Nausea And Vomiting   Trulicity [Dulaglutide]     diarrhea   Metformin Diarrhea   Sulfa Antibiotics Rash and Nausea And Vomiting    Chief Complaint  Patient presents with   Medical Management of Chronic Issues    Patient presents today for a 4 month follow-up.   Quality Metric Gaps    Eye exam, foot exam, Covid and flu vaccines     HPI: Patient is a 65 y.o. male.  Patient is here to follow-up diabetes.  He is also seen in the [facility] system.  They may be doing some of the same labs to follow his diabetes but I do not have reports from there.  He has also seen general ophthalmology recently who has referred him to a retina doctor. He denies chest pain.  He denies foot pain numbness or tingling.  A1c was supposed to have been done with his most recent labs but that did not happen and will be ordered today. Also complains of nocturia.  He felt that was probably related to elevated sugars but since it occurs primarily at night there is probably some element of prostatic hypertrophy involved   Review of Systems:  Review of Systems  Eyes:  Negative for blurred vision.  Respiratory: Negative.    Cardiovascular:  Negative for chest pain.  Genitourinary:  Positive for frequency.  Neurological: Negative.   Psychiatric/Behavioral: Negative.    All other systems reviewed and are negative.  Past Medical History:  Diagnosis Date   Allergy    Arthritis    hands, back    Cataract    removed right eye    COVID-19    casirivimab\imdevimab infusions    Diabetes mellitus without complication ([place])    History of colonoscopy    Hyperlipidemia    Hypertension    Past Surgical History:  Procedure  Laterality Date   CARPAL TUNNEL RELEASE Bilateral    CATARACT EXTRACTION Right    CHOLECYSTECTOMY  1990   COLONOSCOPY     CYST REMOVAL NECK     POLYPECTOMY     Social History:   reports that he quit smoking about 39 years ago. His smoking use included cigarettes. He has never used smokeless tobacco. He reports that he does not drink alcohol and does not use drugs.  Family History  Problem Relation Age of Onset   Diabetes Mother 34   Hypertension Mother    Diabetes Father 59   Hypertension Father    Colon cancer Neg Hx    Colon polyps Neg Hx    Esophageal cancer Neg Hx    Rectal cancer Neg Hx    Stomach cancer Neg Hx     Medications: Patient's Medications  New Prescriptions   No medications on file  Previous Medications   AMLODIPINE (NORVASC) 5 MG TABLET    Take 1 tablet (5 mg total) by mouth daily.   EMPAGLIFLOZIN (JARDIANCE) 25 MG TABS TABLET    Take 25 mg by mouth daily.   LATANOPROST (XALATAN) 0.005 % OPHTHALMIC SOLUTION    Place 1 drop into both eyes at bedtime.   REPAGLINIDE (PRANDIN) 1 MG TABLET    Take 1 mg by  mouth 2 (two) times daily before a meal.   ROSUVASTATIN (CRESTOR) 40 MG TABLET    Take 1 tablet (40 mg total) by mouth daily.  Modified Medications   No medications on file  Discontinued Medications   No medications on file    Physical Exam:  Vitals:   09/05/20 0821  BP: 122/72  Pulse: 79  Temp: 97.7 F (36.5 C)  TempSrc: Temporal  SpO2: 97%  Weight: 136 lb 6.4 oz (61.9 kg)  Height: '5\' 2"'$  (1.575 m)   Body mass index is 24.95 kg/m. Wt Readings from Last 3 Encounters:  09/05/20 136 lb 6.4 oz (61.9 kg)  05/03/20 139 lb 12.8 oz (63.4 kg)  03/07/20 142 lb (64.4 kg)    Physical Exam Vitals and nursing note reviewed.  Constitutional:      Appearance: Normal appearance.  Cardiovascular:     Rate and Rhythm: Normal rate and regular rhythm.     Pulses: Normal pulses.  Pulmonary:     Effort: Pulmonary effort is normal.     Breath sounds: Normal  breath sounds.  Neurological:     General: No focal deficit present.     Mental Status: He is alert and oriented to person, place, and time.    Labs reviewed: Basic Metabolic Panel: Recent Labs    02/04/20 1637 04/28/20 0806 08/25/20 0806  NA 143 140 139  K 4.1 4.2 4.8  CL 107* 103 101  CO2 23 30 32  GLUCOSE 131* 133* 129*  BUN 27 24 28*  CREATININE 1.51* 1.19 1.30  CALCIUM 9.8 9.7 9.9   Liver Function Tests: Recent Labs    09/07/19 0805 02/04/20 1637  AST 31 35  ALT 30 37  ALKPHOS 100 111  BILITOT 0.8 0.4  PROT 7.7 7.9  ALBUMIN 4.4 4.5   No results for input(s): LIPASE, AMYLASE in the last 8760 hours. No results for input(s): AMMONIA in the last 8760 hours. CBC: No results for input(s): WBC, NEUTROABS, HGB, HCT, MCV, PLT in the last 8760 hours. Lipid Panel: Recent Labs    09/07/19 0805 02/04/20 1637  CHOL 141 170  HDL 34* 44  LDLCALC 81 94  TRIG 149 185*  CHOLHDL 4.1 3.9   TSH: No results for input(s): TSH in the last 8760 hours. A1C: Lab Results  Component Value Date   HGBA1C 7.9 (H) 04/28/2020     Assessment/Plan  1. Type 2 diabetes mellitus with microalbuminuria, without long-term current use of insulin ([place]) Patient is trying to improve diet.  A1c is pending at the time of this dictation - Hemoglobin A1c  2. BPH associated with nocturia Most recent PSA was from 2 years ago and was 2.0 - PSA, total and free  3. Hyperlipidemia, unspecified hyperlipidemia type Lipids are at goal on Crestor 20 mg   Alain Honey, MD [place] [phone]

## 2020-09-05 NOTE — Addendum Note (Signed)
Addended by: Dan Maker on: 09/05/2020 09:16 AM   Modules accepted: Orders

## 2020-09-05 NOTE — Patient Instructions (Addendum)
No change to meds Keep appt with retina Dr

## 2020-09-06 LAB — HEMOGLOBIN A1C
Hgb A1c MFr Bld: 7.1 % of total Hgb — ABNORMAL HIGH (ref <5.7)
Mean Plasma Glucose: 157 mg/dL
eAG (mmol/L): 8.7 mmol/L

## 2020-09-06 LAB — PSA, TOTAL AND FREE
PSA, % Free: 36 % (calc) (ref >25)
PSA, Free: 0.4 ng/mL
PSA, Total: 1.1 ng/mL (ref <=4.0)

## 2021-01-14 IMAGING — DX DG CHEST 1V PORT
1 series · 1 of 1 positions shown · non-contrast
Comparison: February 04, 2019

CLINICAL DATA: Shortness of breath and fever.

EXAM:
PORTABLE CHEST 1 VIEW

[chest ap]
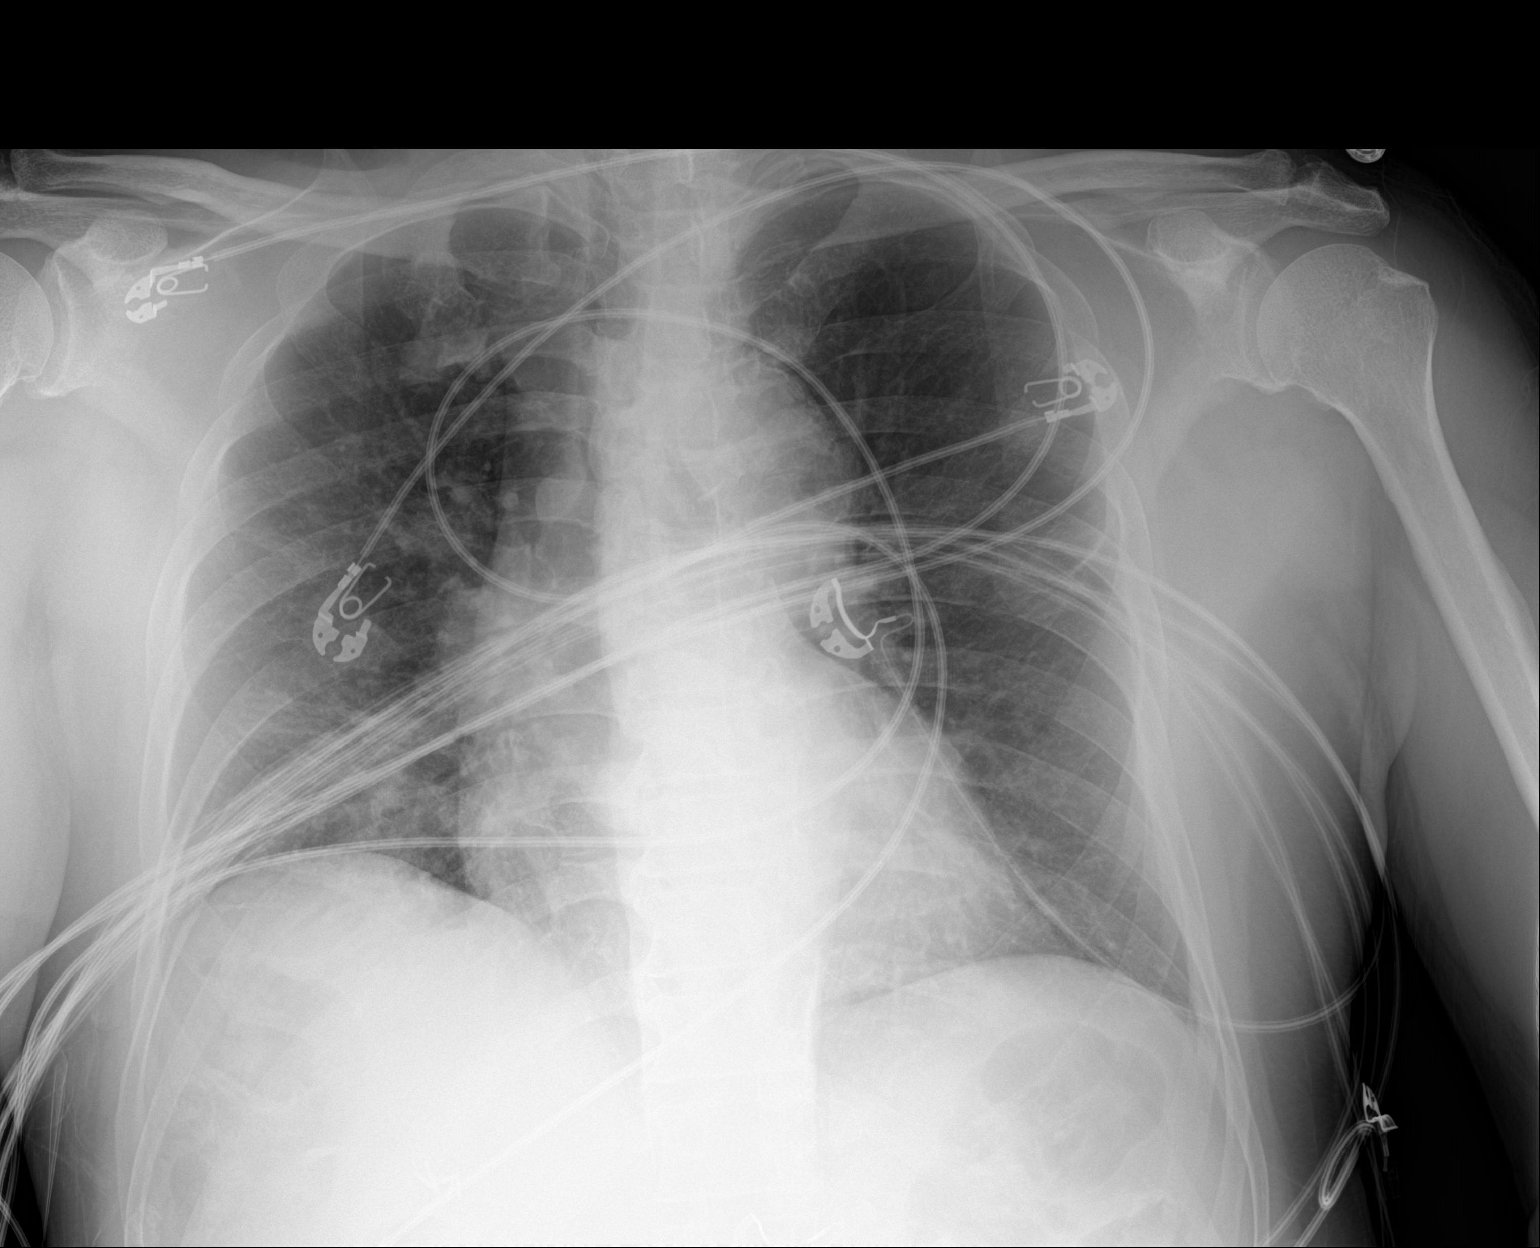

[1 of 1 positions shown; findings below may reference images not displayed]

FINDINGS: Multiple overlying radiopaque cardiac lead wires are seen.

Very mild atelectasis and/or infiltrate is seen within the right
lung base. This represents a new finding when compared to the prior
study.

There is no evidence of a pleural effusion or pneumothorax.

The heart size and mediastinal contours are within normal limits.

The visualized skeletal structures are unremarkable.
IMPRESSION: Very mild right basilar atelectasis and/or infiltrate. This
represents a new finding when compared to the prior study dated
February 04, 2019.

## 2021-01-17 IMAGING — DX DG CHEST 1V PORT
1 series · 1 of 1 positions shown · non-contrast
Comparison: February 05, 2019

CLINICAL DATA: Shortness of breath.

EXAM:
PORTABLE CHEST 1 VIEW

[chest ap]
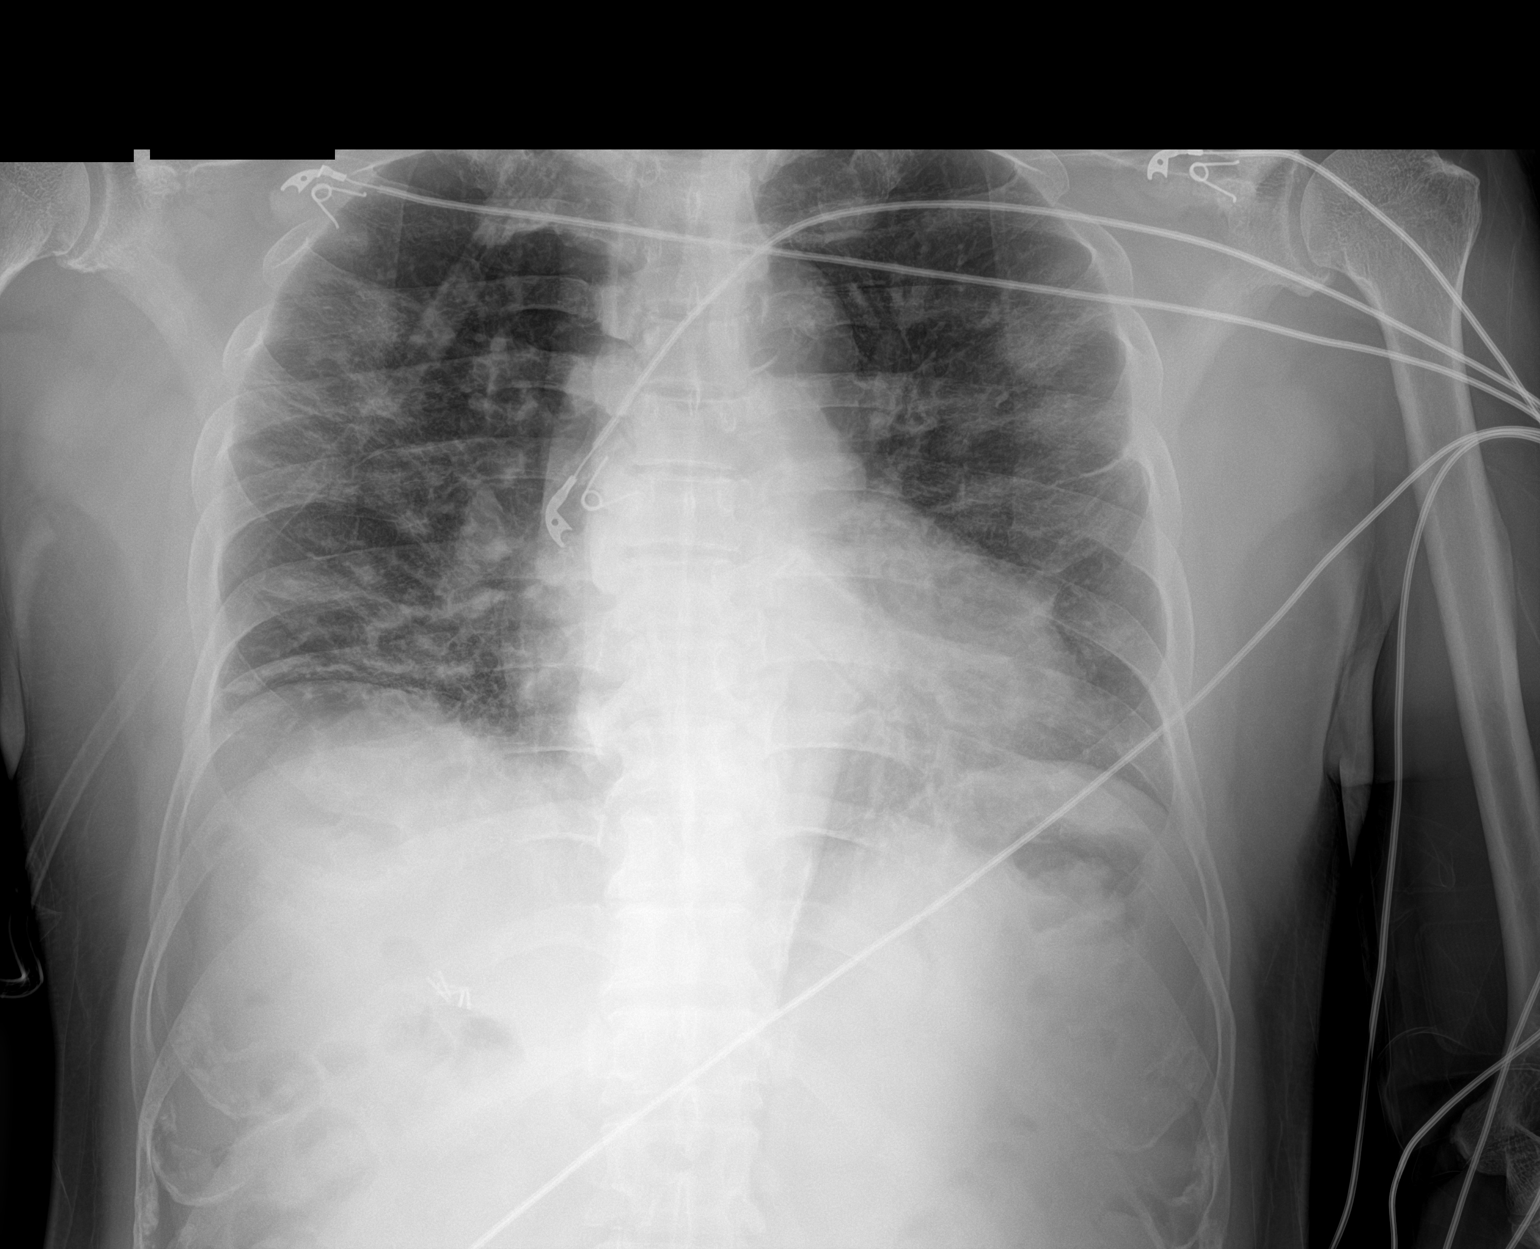

[1 of 1 positions shown; findings below may reference images not displayed]

FINDINGS: Small patchy infiltrates are seen throughout both lungs. There is no
evidence of a pleural effusion or pneumothorax. The heart size and
mediastinal contours are within normal limits. The visualized
skeletal structures are unremarkable. Radiopaque surgical clips are
seen overlying the right upper quadrant.
IMPRESSION: 1. Mild to moderate severity diffuse bilateral infiltrates.

## 2021-02-14 ENCOUNTER — Other Ambulatory Visit: Payer: Self-pay

## 2021-02-14 ENCOUNTER — Ambulatory Visit (INDEPENDENT_AMBULATORY_CARE_PROVIDER_SITE_OTHER): Payer: Medicare Other | Admitting: Family Medicine

## 2021-02-14 ENCOUNTER — Encounter: Payer: Self-pay | Admitting: Family Medicine

## 2021-02-14 VITALS — BP 138/82 | HR 62 | Temp 98.2°F | Ht 62.0 in | Wt 140.4 lb

## 2021-02-14 DIAGNOSIS — E785 Hyperlipidemia, unspecified: Secondary | ICD-10-CM | POA: Diagnosis not present

## 2021-02-14 DIAGNOSIS — I10 Essential (primary) hypertension: Secondary | ICD-10-CM | POA: Diagnosis not present

## 2021-02-14 DIAGNOSIS — E1129 Type 2 diabetes mellitus with other diabetic kidney complication: Secondary | ICD-10-CM | POA: Diagnosis not present

## 2021-02-14 DIAGNOSIS — N179 Acute kidney failure, unspecified: Secondary | ICD-10-CM | POA: Diagnosis not present

## 2021-02-14 DIAGNOSIS — R519 Headache, unspecified: Secondary | ICD-10-CM

## 2021-02-14 DIAGNOSIS — R809 Proteinuria, unspecified: Secondary | ICD-10-CM | POA: Diagnosis not present

## 2021-02-14 LAB — BASIC METABOLIC PANEL WITH GFR
BUN/Creatinine Ratio: 20 (calc) (ref 6–22)
BUN: 28 mg/dL — ABNORMAL HIGH (ref 7–25)
CO2: 28 mmol/L (ref 20–32)
Calcium: 9.5 mg/dL (ref 8.6–10.3)
Chloride: 103 mmol/L (ref 98–110)
Creat: 1.37 mg/dL — ABNORMAL HIGH (ref 0.70–1.35)
Glucose, Bld: 133 mg/dL — ABNORMAL HIGH (ref 65–99)
Potassium: 4.3 mmol/L (ref 3.5–5.3)
Sodium: 138 mmol/L (ref 135–146)
eGFR: 57 mL/min/{1.73_m2} — ABNORMAL LOW (ref >=60)

## 2021-02-14 LAB — LIPID PANEL
Cholesterol: 162 mg/dL (ref <200)
HDL: 48 mg/dL (ref >=40)
LDL Cholesterol (Calc): 95 mg/dL (calc)
Non-HDL Cholesterol (Calc): 114 mg/dL (calc) (ref <130)
Total CHOL/HDL Ratio: 3.4 (calc) (ref <5.0)
Triglycerides: 97 mg/dL (ref <150)

## 2021-02-14 LAB — SEDIMENTATION RATE: Sed Rate: 2 mm/h (ref 0–20)

## 2021-02-14 IMAGING — DX DG CHEST 2V
2 series · 2 of 2 positions shown · non-contrast
Comparison: 02/08/2019

CLINICAL DATA: Follow-up pneumonia.

EXAM:
CHEST - 2 VIEW

[chest pa]
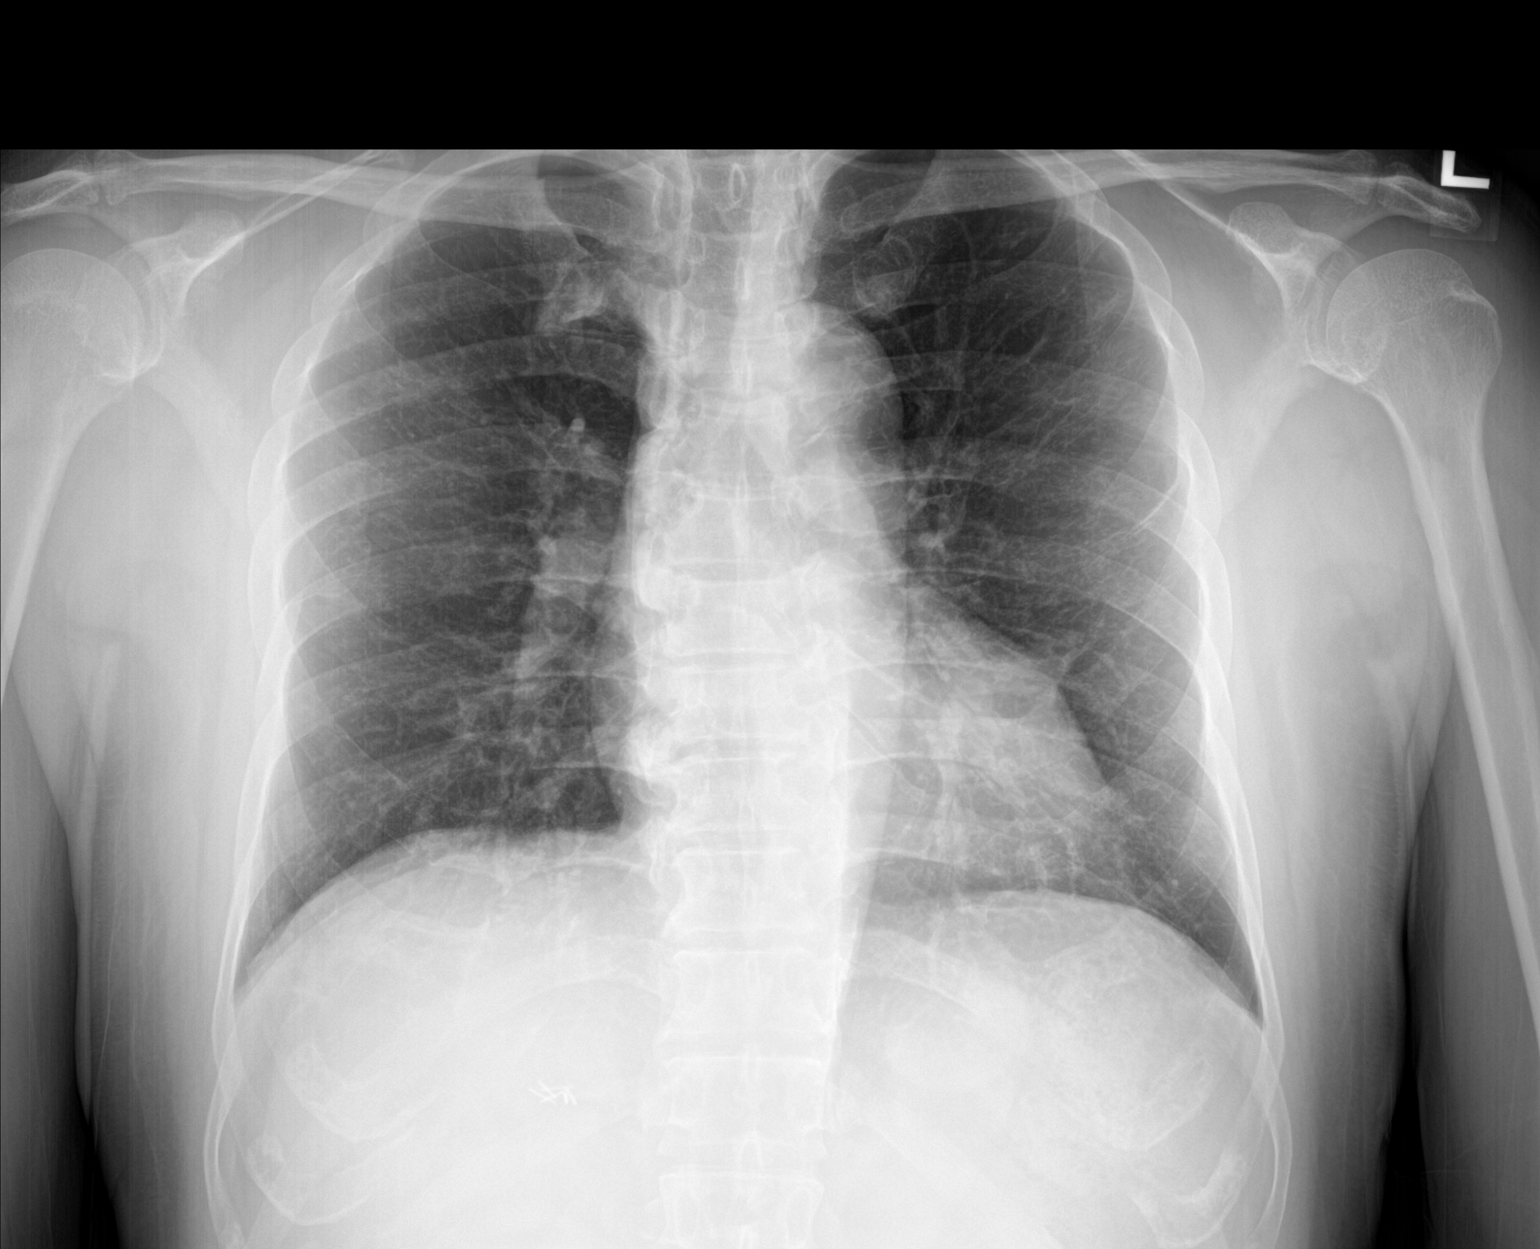

[chest lat]
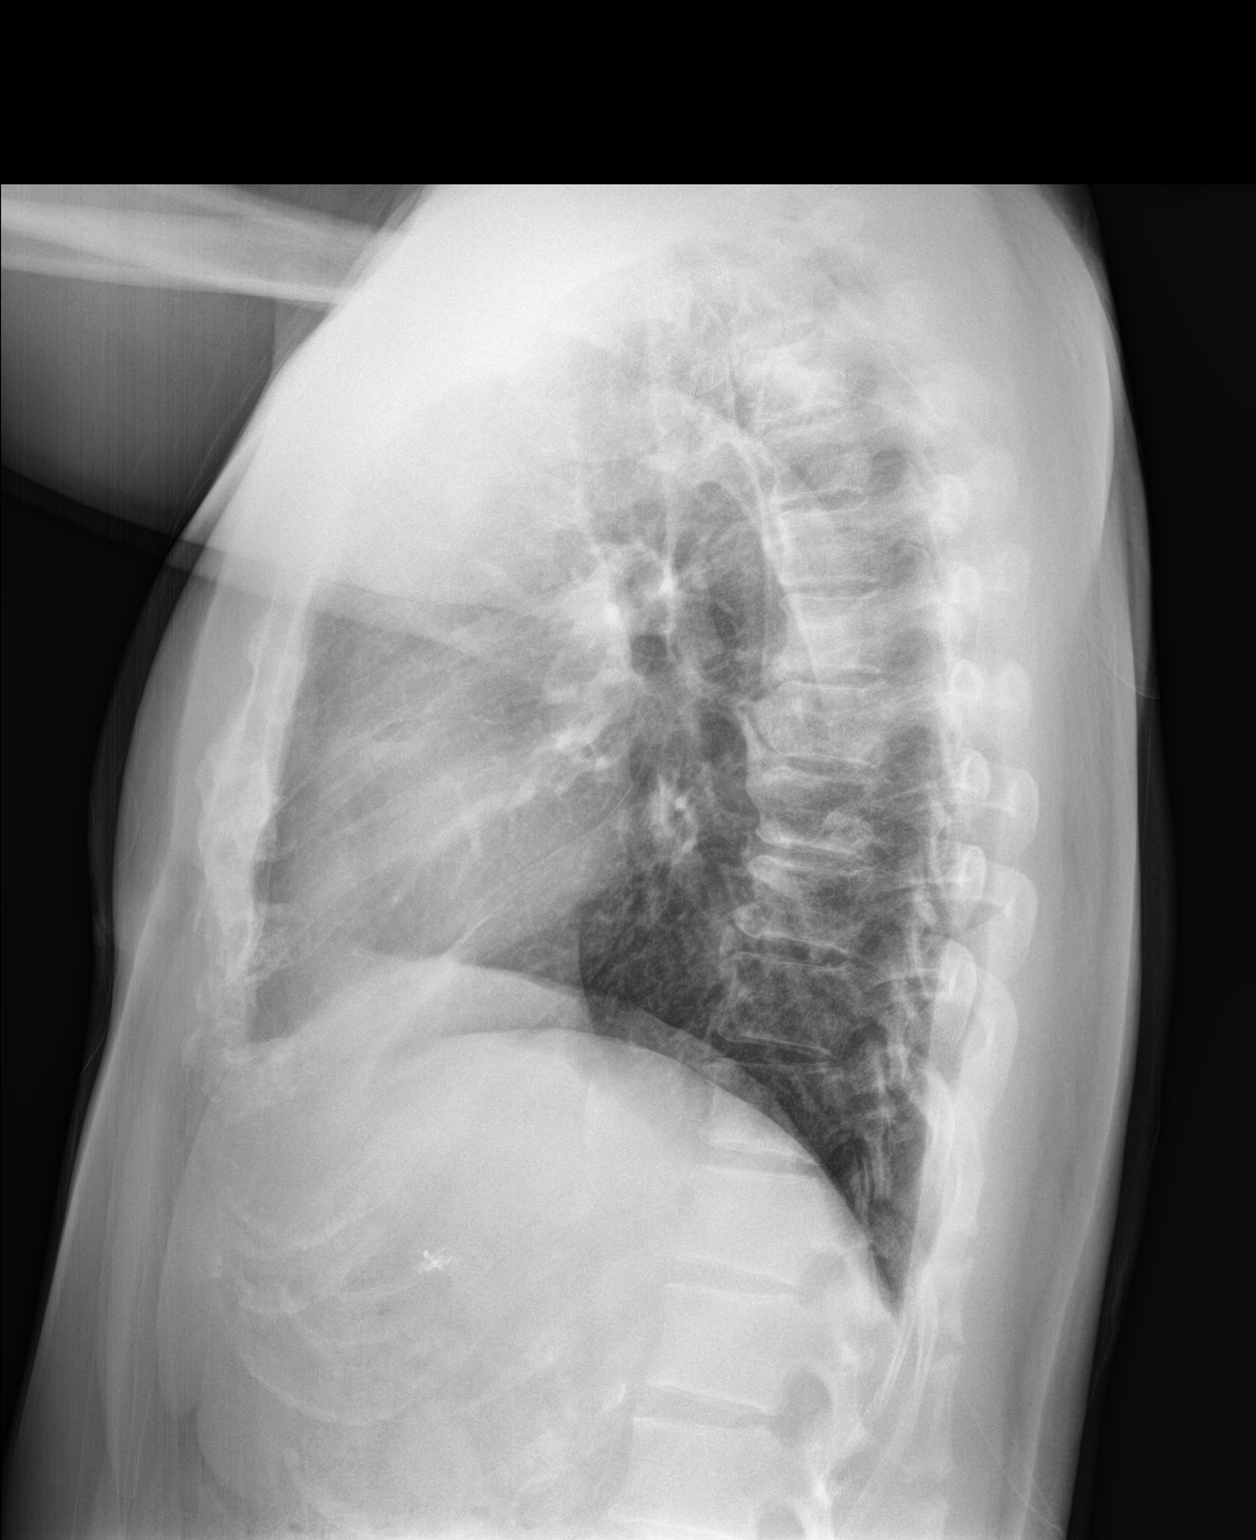

[2 of 2 positions shown; findings below may reference images not displayed]

FINDINGS: The heart size and mediastinal contours are within normal limits.
Both lungs are clear. The visualized skeletal structures are
unremarkable.
IMPRESSION: No active cardiopulmonary disease.

## 2021-02-14 MED ORDER — REPAGLINIDE 1 MG PO TABS: 1.0000 mg | ORAL_TABLET | Freq: Two times a day (BID) | ORAL | 2 refills | Status: DC

## 2021-02-14 MED ORDER — EMPAGLIFLOZIN 25 MG PO TABS: 25.0000 mg | ORAL_TABLET | Freq: Every day | ORAL | 2 refills | Status: DC

## 2021-02-14 NOTE — Progress Notes (Signed)
Provider:  Alain Honey, MD  Careteam: Patient Care Team: Wardell Honour, MD as PCP - General (Family Medicine)  PLACE OF SERVICE:  [facility]  Advanced Directive information    Allergies  Allergen Reactions   Losartan     Other reaction(s): Cough   Septra [Bactrim] Nausea And Vomiting   Trulicity [Dulaglutide]     diarrhea   Metformin Diarrhea   Sulfa Antibiotics Rash and Nausea And Vomiting    No chief complaint on file.    HPI: Patient is a 66 y.o. male .  Patient presents today to follow-up and medical management of chronic problems including diabetes hypertension and hyperlipidemia.  We spent some time talking about recent lab reports.  5 months ago his A1c was 7.1, the best it has been in 2 years.  Renal function was trending toward normalization.  Lipids were almost at goal on rosuvastatin. He has seen retina doctor who gave him a good report regarding possible diabetic eye disease.  Review of Systems:  Review of Systems  Constitutional: Negative.   Eyes: Negative.   Respiratory: Negative.    Cardiovascular: Negative.   Gastrointestinal: Negative.   Genitourinary: Negative.   Musculoskeletal: Negative.   Neurological: Negative.   Psychiatric/Behavioral: Negative.    All other systems reviewed and are negative.  Past Medical History:  Diagnosis Date   Allergy    Arthritis    hands, back    Cataract    removed right eye    COVID-19    casirivimab\imdevimab infusions    Diabetes mellitus without complication ([place])    History of colonoscopy    Hyperlipidemia    Hypertension    Past Surgical History:  Procedure Laterality Date   CARPAL TUNNEL RELEASE Bilateral    CATARACT EXTRACTION Right    CHOLECYSTECTOMY  1990   COLONOSCOPY     CYST REMOVAL NECK     POLYPECTOMY     Social History:   reports that he quit smoking about 39 years ago. His smoking use included cigarettes. He has never used smokeless tobacco. He reports that he does not  drink alcohol and does not use drugs.  Family History  Problem Relation Age of Onset   Diabetes Mother 63   Hypertension Mother    Diabetes Father 56   Hypertension Father    Colon cancer Neg Hx    Colon polyps Neg Hx    Esophageal cancer Neg Hx    Rectal cancer Neg Hx    Stomach cancer Neg Hx     Medications: Patient's Medications  New Prescriptions   No medications on file  Previous Medications   AMLODIPINE (NORVASC) 5 MG TABLET    Take 1 tablet (5 mg total) by mouth daily.   EMPAGLIFLOZIN (JARDIANCE) 25 MG TABS TABLET    Take 25 mg by mouth daily.   LATANOPROST (XALATAN) 0.005 % OPHTHALMIC SOLUTION    Place 1 drop into both eyes at bedtime.   REPAGLINIDE (PRANDIN) 1 MG TABLET    Take 1 mg by mouth 2 (two) times daily before a meal.   ROSUVASTATIN (CRESTOR) 40 MG TABLET    Take 1 tablet (40 mg total) by mouth daily.  Modified Medications   No medications on file  Discontinued Medications   No medications on file    Physical Exam:  There were no vitals filed for this visit. There is no height or weight on file to calculate BMI. Wt Readings from Last 3 Encounters:  09/05/20  136 lb 6.4 oz (61.9 kg)  05/03/20 139 lb 12.8 oz (63.4 kg)  03/07/20 142 lb (64.4 kg)    Physical Exam Vitals and nursing note reviewed.  Constitutional:      Appearance: Normal appearance.  HENT:     Head: Normocephalic.  Cardiovascular:     Rate and Rhythm: Normal rate and regular rhythm.     Pulses: Normal pulses.     Heart sounds: Normal heart sounds.  Pulmonary:     Effort: Pulmonary effort is normal.     Breath sounds: Normal breath sounds.  Neurological:     General: No focal deficit present.     Mental Status: He is alert and oriented to person, place, and time.  Psychiatric:        Mood and Affect: Mood normal.        Behavior: Behavior normal.        Thought Content: Thought content normal.        Judgment: Judgment normal.    Labs reviewed: Basic Metabolic Panel: Recent  Labs    04/28/20 0806 08/25/20 0806  NA 140 139  K 4.2 4.8  CL 103 101  CO2 30 32  GLUCOSE 133* 129*  BUN 24 28*  CREATININE 1.19 1.30  CALCIUM 9.7 9.9   Liver Function Tests: No results for input(s): AST, ALT, ALKPHOS, BILITOT, PROT, ALBUMIN in the last 8760 hours. No results for input(s): LIPASE, AMYLASE in the last 8760 hours. No results for input(s): AMMONIA in the last 8760 hours. CBC: No results for input(s): WBC, NEUTROABS, HGB, HCT, MCV, PLT in the last 8760 hours. Lipid Panel: No results for input(s): CHOL, HDL, LDLCALC, TRIG, CHOLHDL, LDLDIRECT in the last 8760 hours. TSH: No results for input(s): TSH in the last 8760 hours. A1C: Lab Results  Component Value Date   HGBA1C 7.1 (H) 09/05/2020     Assessment/Plan  1. Temporal headache Slight tenderness in the temporal region with palpation we will check sed rate.  Denies any visual symptom  2. Type 2 diabetes mellitus with microalbuminuria, without long-term current use of insulin (HCC) Seems to be doing well.  Due to recheck A1c today  3. Hyperlipidemia, unspecified hyperlipidemia type Lipids drawn.  Tolerating statin well.  Would like to keep LDL under 100  4. AKI (acute kidney injury) ([place]) Most recent renal function shows normal creatinine.  Did not have GFR but will check today  5. Primary hypertension Blood pressure is okay at 138/82 on amlodipine 5 mg.  Unsure why he is not on ACE or ARB   Alain Honey, MD [place] [phone]

## 2021-03-28 ENCOUNTER — Other Ambulatory Visit: Payer: Self-pay

## 2021-03-28 ENCOUNTER — Ambulatory Visit (INDEPENDENT_AMBULATORY_CARE_PROVIDER_SITE_OTHER): Payer: Medicare Other | Admitting: Family Medicine

## 2021-03-28 ENCOUNTER — Encounter: Payer: Self-pay | Admitting: Family Medicine

## 2021-03-28 VITALS — BP 148/76 | HR 70 | Temp 97.3°F | Ht 62.0 in | Wt 138.8 lb

## 2021-03-28 DIAGNOSIS — E1129 Type 2 diabetes mellitus with other diabetic kidney complication: Secondary | ICD-10-CM | POA: Diagnosis not present

## 2021-03-28 DIAGNOSIS — E785 Hyperlipidemia, unspecified: Secondary | ICD-10-CM | POA: Diagnosis not present

## 2021-03-28 DIAGNOSIS — R809 Proteinuria, unspecified: Secondary | ICD-10-CM | POA: Diagnosis not present

## 2021-03-28 DIAGNOSIS — R109 Unspecified abdominal pain: Secondary | ICD-10-CM | POA: Insufficient documentation

## 2021-03-28 DIAGNOSIS — R1013 Epigastric pain: Secondary | ICD-10-CM | POA: Diagnosis not present

## 2021-03-28 MED ORDER — REPAGLINIDE 2 MG PO TABS: 2.0000 mg | ORAL_TABLET | Freq: Three times a day (TID) | ORAL | 1 refills | Status: DC

## 2021-03-28 MED ORDER — PANTOPRAZOLE SODIUM 40 MG PO TBEC: 40.0000 mg | DELAYED_RELEASE_TABLET | Freq: Every day | ORAL | 3 refills | Status: DC

## 2021-03-28 NOTE — Progress Notes (Signed)
? ? ?Provider:  ?Alain Honey, MD ? ?Careteam: ?Patient Care Team: ?Wardell Honour, MD as PCP - General (Family Medicine) ? ?PLACE OF SERVICE:  ?[facility]  ?Advanced Directive information ?  ? ?Allergies  ?Allergen Reactions  ? Losartan   ?  Other reaction(s): Cough  ? Septra [Bactrim] Nausea And Vomiting  ? Trulicity [Dulaglutide]   ?  diarrhea  ? Metformin Diarrhea  ? Sulfa Antibiotics Rash and Nausea And Vomiting  ? ? ?Chief Complaint  ?Patient presents with  ? Acute Visit  ?  Patient presents today for abdominal pain for 2 weeks. He reports pain is mainly in the middle of stomach. He reports taking Tums and Pepcid and they not working. He denies any constipation,diarrhea, back pain or blood in stool.  ? ? ? ?HPI: Patient is a 66 y.o. male this gentleman is here with abdominal pain for the past 2 weeks.  Pain is between navel and sternum.  It awakens him at night sometimes.  He has had no nausea vomiting or change in bowel habits.  He denies back pain.  He has taken Tums and Pepcid without relief.  He does endorse drinking several cups of coffee per day.  He is status postcholecystectomy. ? ?Review of Systems:  ?Review of Systems  ?Respiratory: Negative.    ?Cardiovascular: Negative.   ?Gastrointestinal:  Positive for abdominal pain. Negative for blood in stool, constipation, diarrhea, heartburn, melena, nausea and vomiting.  ?Neurological: Negative.   ?Psychiatric/Behavioral: Negative.    ?All other systems reviewed and are negative. ? ?Past Medical History:  ?Diagnosis Date  ? Allergy   ? Arthritis   ? hands, back   ? Cataract   ? removed right eye   ? COVID-19   ? casirivimab\imdevimab infusions   ? Diabetes mellitus without complication ([place])   ? History of colonoscopy   ? Hyperlipidemia   ? Hypertension   ? ?Past Surgical History:  ?Procedure Laterality Date  ? CARPAL TUNNEL RELEASE Bilateral   ? CATARACT EXTRACTION Right   ? CHOLECYSTECTOMY  1990  ? COLONOSCOPY    ? CYST REMOVAL NECK    ? POLYPECTOMY     ? ?Social History: ?  reports that he quit smoking about 40 years ago. His smoking use included cigarettes. He has never used smokeless tobacco. He reports that he does not drink alcohol and does not use drugs. ? ?Family History  ?Problem Relation Age of Onset  ? Diabetes Mother 68  ? Hypertension Mother   ? Diabetes Father 90  ? Hypertension Father   ? Colon cancer Neg Hx   ? Colon polyps Neg Hx   ? Esophageal cancer Neg Hx   ? Rectal cancer Neg Hx   ? Stomach cancer Neg Hx   ? ? ?Medications: ?Patient's Medications  ?New Prescriptions  ? No medications on file  ?Previous Medications  ? AMLODIPINE (NORVASC) 5 MG TABLET    Take 1 tablet (5 mg total) by mouth daily.  ? EMPAGLIFLOZIN (JARDIANCE) 25 MG TABS TABLET    Take 1 tablet (25 mg total) by mouth daily.  ? LATANOPROST (XALATAN) 0.005 % OPHTHALMIC SOLUTION    Place 1 drop into both eyes at bedtime.  ? REPAGLINIDE (PRANDIN) 1 MG TABLET    Take 1 tablet (1 mg total) by mouth 2 (two) times daily before a meal.  ? ROSUVASTATIN (CRESTOR) 40 MG TABLET    Take 1 tablet (40 mg total) by mouth daily.  ?Modified Medications  ?  No medications on file  ?Discontinued Medications  ? No medications on file  ? ? ?Physical Exam: ? ?Vitals:  ? 03/28/21 1027  ?BP: (!) 148/76  ?Pulse: 70  ?Temp: (!) 97.3 ?F (36.3 ?C)  ?SpO2: 98%  ?Weight: 138 lb 12.8 oz (63 kg)  ?Height: '5\' 2"'$  (1.575 m)  ? ?Body mass index is 25.39 kg/m?. ?Wt Readings from Last 3 Encounters:  ?03/28/21 138 lb 12.8 oz (63 kg)  ?02/14/21 140 lb 6.4 oz (63.7 kg)  ?09/05/20 136 lb 6.4 oz (61.9 kg)  ? ? ?Physical Exam ?Abdominal:  ?   General: Abdomen is flat. Bowel sounds are normal.  ?   Palpations: Abdomen is soft.  ?   Tenderness: There is no abdominal tenderness. There is no guarding or rebound.  ? ? ?Labs reviewed: ?Basic Metabolic Panel: ?Recent Labs  ?  04/28/20 ?0806 08/25/20 ?0806 02/14/21 ?0903  ?NA 140 139 138  ?K 4.2 4.8 4.3  ?CL 103 101 103  ?CO2 30 32 28  ?GLUCOSE 133* 129* 133*  ?BUN 24 28* 28*   ?CREATININE 1.19 1.30 1.37*  ?CALCIUM 9.7 9.9 9.5  ? ?Liver Function Tests: ?No results for input(s): AST, ALT, ALKPHOS, BILITOT, PROT, ALBUMIN in the last 8760 hours. ?No results for input(s): LIPASE, AMYLASE in the last 8760 hours. ?No results for input(s): AMMONIA in the last 8760 hours. ?CBC: ?No results for input(s): WBC, NEUTROABS, HGB, HCT, MCV, PLT in the last 8760 hours. ?Lipid Panel: ?Recent Labs  ?  02/14/21 ?0903  ?CHOL 162  ?HDL 48  ?[place] 95  ?TRIG 97  ?CHOLHDL 3.4  ? ?TSH: ?No results for input(s): TSH in the last 8760 hours. ?A1C: ?Lab Results  ?Component Value Date  ? HGBA1C 7.1 (H) 09/05/2020  ? ? ? ?Assessment/Plan ? ?1. Hyperlipidemia, unspecified hyperlipidemia type ?Lipids were assessed and last month with LDL at goal at 95 ? ?2. Type 2 diabetes mellitus with microalbuminuria, without long-term current use of insulin ([place]) ?Patient complains of expense of Jardiance.  He used to get medicines through the [place].  He also takes Prandin but only 1 mg twice a day.  A1c's have not been at target.  Will increase Prandin to 2 mg before meals and recheck A1c at next visit ? ?3. Epigastric pain ?Pain is likely related to acid.  Discontinue Tums and Pepcid in favor of Protonix.  If symptoms or not improved in 1 to 2 weeks next plan would be abdominal CT and/or referral to gastroenterology. ? ? ?Alain Honey, MD ?[facility] ?[phone]  ? ?

## 2021-03-28 NOTE — Patient Instructions (Signed)
Reduce caffeine intake ?Reduce sweets in diet ?

## 2021-04-10 ENCOUNTER — Ambulatory Visit: Payer: Medicare Other | Admitting: Family Medicine

## 2021-05-14 DIAGNOSIS — H2513 Age-related nuclear cataract, bilateral: Secondary | ICD-10-CM | POA: Diagnosis not present

## 2021-05-14 DIAGNOSIS — H1045 Other chronic allergic conjunctivitis: Secondary | ICD-10-CM | POA: Diagnosis not present

## 2021-05-14 DIAGNOSIS — H401131 Primary open-angle glaucoma, bilateral, mild stage: Secondary | ICD-10-CM | POA: Diagnosis not present

## 2021-05-14 DIAGNOSIS — E119 Type 2 diabetes mellitus without complications: Secondary | ICD-10-CM | POA: Diagnosis not present

## 2021-05-14 LAB — HM DIABETES EYE EXAM

## 2021-06-06 ENCOUNTER — Ambulatory Visit (HOSPITAL_COMMUNITY)
Admission: EM | Admit: 2021-06-06 | Discharge: 2021-06-06 | Disposition: A | Discharge status: Home / Self Care | Payer: Medicare Other | Attending: Internal Medicine | Admitting: Internal Medicine

## 2021-06-06 ENCOUNTER — Encounter (HOSPITAL_COMMUNITY): Payer: Self-pay | Admitting: Emergency Medicine

## 2021-06-06 DIAGNOSIS — H60391 Other infective otitis externa, right ear: Secondary | ICD-10-CM

## 2021-06-06 DIAGNOSIS — H8110 Benign paroxysmal vertigo, unspecified ear: Secondary | ICD-10-CM | POA: Diagnosis not present

## 2021-06-06 LAB — POCT URINALYSIS DIPSTICK, ED / UC
Bilirubin Urine: NEGATIVE
Glucose, UA: 500 mg/dL — AB
Ketones, ur: NEGATIVE mg/dL
Leukocytes,Ua: NEGATIVE
Nitrite: NEGATIVE
Protein, ur: 30 mg/dL — AB
Specific Gravity, Urine: 1.015 (ref 1.005–1.030)
Urobilinogen, UA: 0.2 mg/dL (ref 0.0–1.0)
pH: 5 (ref 5.0–8.0)

## 2021-06-06 LAB — CBG MONITORING, ED: Glucose-Capillary: 204 mg/dL — ABNORMAL HIGH (ref 70–99)

## 2021-06-06 MED ORDER — MECLIZINE HCL 12.5 MG PO TABS: 12.5000 mg | ORAL_TABLET | Freq: Three times a day (TID) | ORAL | 0 refills | Status: AC | PRN

## 2021-06-06 MED ORDER — OFLOXACIN 0.3 % OT SOLN: 10.0000 [drp] | Freq: Every day | OTIC | 0 refills | Status: DC

## 2021-06-06 MED ORDER — AMOXICILLIN 500 MG PO CAPS: 500.0000 mg | ORAL_CAPSULE | Freq: Three times a day (TID) | ORAL | 0 refills | Status: DC

## 2021-06-06 NOTE — Discharge Instructions (Addendum)
You were seen in urgent care for benign positional vertigo.  Take meclizine every 8 hours as needed for dizziness.  Do not take this medication and drink alcohol or drive as it can make you very sleepy.  Follow-up with primary care tomorrow regarding dizziness.  Urine was negative for infection.   Blood sugar was 204 in the clinic today.  Take amoxicillin every 8 hours daily for the next 7 days to treat your infection.  If you develop any new or worsening symptoms or do not improve in the next 2 to 3 days, please return.  If your symptoms are severe, please go to the emergency room.  Follow-up with your primary care provider for further evaluation and management of your symptoms as well as ongoing wellness visits.  I hope you feel better!

## 2021-06-06 NOTE — ED Triage Notes (Signed)
Pt is present today with c/o feeling lightheaded and dizzy. Pt sx started x3 days ago

## 2021-06-06 NOTE — ED Provider Notes (Signed)
[facility]    CSN: [id number] Arrival date & time: 06/06/21  1639      History   Chief Complaint Chief Complaint  Patient presents with  . Dizziness    HPI Jesse Baldwin is a 66 y.o. male.   Patient presents to urgent care with his daughter for evaluation of dizziness that started 3 days ago. He was in the garden yesterday and he stood up and became dizzy and had blurry vision. After that episode, he began to have dizziness with walking. When he is not moving his head sitting still, he feels okay and is not dizzy.  He becomes very dizzy with head movement and describes dizziness as a "spinning feeling".  He denies nausea, chest pain, shortness of breath, diaphoresis, arm pain, blurry vision at this time, and headache.  He has not vomited, denies diarrhea, and denies weakness to one side. Daughter states his face appears normal.  He states that this is never happened before.  He is reporting a small amount of urinary frequency, but states that he has been drinking "a lot of water" over the last 2 days to prevent dehydration cause for dizziness.  He is a diabetic and states that his blood sugar at home this morning was 118.  He has hypertension and states that his blood pressure this morning was 125/62.  He does take an SGLT2 inhibitor for his diabetes and he denies frequent urinary tract infections while on this medication.  No other aggravating or relieving factors identified at this time.   Dizziness  Past Medical History:  Diagnosis Date  . Allergy   . Arthritis    hands, back   . Cataract    removed right eye   . COVID-19    casirivimab\imdevimab infusions   . Diabetes mellitus without complication ([place])   . History of colonoscopy   . Hyperlipidemia   . Hypertension     Patient Active Problem List   Diagnosis Date Noted  . Abdominal pain 03/28/2021  . Acute hypoxemic respiratory failure due to COVID-19 ([place]) 02/08/2019  . AKI (acute kidney injury) ([place])  02/08/2019  . Type 2 diabetes mellitus with microalbuminuria, without long-term current use of insulin ([place]) 01/09/2015  . Hypertension 04/08/2011  . Hyperlipidemia 04/08/2011  . Glaucoma 04/08/2011  . Hearing loss in right ear 04/08/2011    Past Surgical History:  Procedure Laterality Date  . CARPAL TUNNEL RELEASE Bilateral   . CATARACT EXTRACTION Right   . CHOLECYSTECTOMY  1990  . COLONOSCOPY    . CYST REMOVAL NECK    . POLYPECTOMY         Home Medications    Prior to Admission medications   Medication Sig Start Date End Date Taking? Authorizing Provider  amoxicillin (AMOXIL) 500 MG capsule Take 1 capsule (500 mg total) by mouth 3 (three) times daily. 06/06/21  Yes Talbot Grumbling, FNP  meclizine (ANTIVERT) 12.5 MG tablet Take 1 tablet (12.5 mg total) by mouth 3 (three) times daily as needed for dizziness. 06/06/21  Yes Talbot Grumbling, FNP  amLODipine (NORVASC) 5 MG tablet Take 1 tablet (5 mg total) by mouth daily. 02/04/20   Posey Boyer, MD  empagliflozin (JARDIANCE) 25 MG TABS tablet Take 1 tablet (25 mg total) by mouth daily. 02/14/21   Wardell Honour, MD  latanoprost (XALATAN) 0.005 % ophthalmic solution Place 1 drop into both eyes at bedtime.    [provider]  pantoprazole (PROTONIX) 40 MG tablet Take 1  tablet (40 mg total) by mouth daily. 03/28/21   Wardell Honour, MD  repaglinide (PRANDIN) 2 MG tablet Take 1 tablet (2 mg total) by mouth 3 (three) times daily before meals. 03/28/21   Wardell Honour, MD  rosuvastatin (CRESTOR) 40 MG tablet Take 1 tablet (40 mg total) by mouth daily. 02/04/20   Posey Boyer, MD    Family History Family History  Problem Relation Age of Onset  . Diabetes Mother 15  . Hypertension Mother   . Diabetes Father 48  . Hypertension Father   . Colon cancer Neg Hx   . Colon polyps Neg Hx   . Esophageal cancer Neg Hx   . Rectal cancer Neg Hx   . Stomach cancer Neg Hx     Social History Social History    Tobacco Use  . Smoking status: Former    Types: Cigarettes    Quit date: 04/07/1981    Years since quitting: 40.1  . Smokeless tobacco: Never  Vaping Use  . Vaping Use: Never used  Substance Use Topics  . Alcohol use: No  . Drug use: No     Allergies   Losartan, Septra [bactrim], Trulicity [dulaglutide], Metformin, and Sulfa antibiotics   Review of Systems Review of Systems  Neurological:  Positive for dizziness.  Per HPI  Physical Exam Triage Vital Signs ED Triage Vitals  Enc Vitals Group     BP 06/06/21 1732 133/65     Pulse Rate 06/06/21 1732 60     Resp 06/06/21 1732 18     Temp 06/06/21 1732 97.9 F (36.6 C)     Temp Source 06/06/21 1732 Oral     SpO2 06/06/21 1732 98 %     Weight --      Height --      Head Circumference --      Peak Flow --      Pain Score 06/06/21 1731 0     Pain Loc --      Pain Edu? --      Excl. in [place]? --    No data found.  Updated Vital Signs BP 133/65   Pulse 60   Temp 97.9 F (36.6 C) (Oral)   Resp 18   SpO2 98%   Visual Acuity Right Eye Distance:   Left Eye Distance:   Bilateral Distance:    Right Eye Near:   Left Eye Near:    Bilateral Near:     Physical Exam Vitals and nursing note reviewed.  Constitutional:      General: He is not in acute distress.    Appearance: Normal appearance. He is well-developed. He is not ill-appearing.     Comments: Very pleasant patient sitting comfortably on exam able in no acute distress.   HENT:     Head: Normocephalic and atraumatic.     Right Ear: Hearing, ear canal and external ear normal. No swelling or tenderness. Tympanic membrane is injected, erythematous and bulging.     Left Ear: Hearing, tympanic membrane, ear canal and external ear normal. No swelling or tenderness.     Nose: Nose normal.     Mouth/Throat:     Lips: Pink.     Mouth: Mucous membranes are moist.     Pharynx: No posterior oropharyngeal erythema.  Eyes:     General: Lids are normal. Vision grossly  intact. Gaze aligned appropriately. No visual field deficit.    Extraocular Movements: Extraocular movements intact.     Conjunctiva/sclera:  Conjunctivae normal.     Right eye: Right conjunctiva is not injected.     Left eye: Left conjunctiva is not injected.  Cardiovascular:     Rate and Rhythm: Normal rate and regular rhythm.     Heart sounds: Normal heart sounds, S1 normal and S2 normal. No murmur heard. Pulmonary:     Effort: Pulmonary effort is normal. No respiratory distress.     Breath sounds: Normal breath sounds. No decreased air movement.  Abdominal:     General: Bowel sounds are normal. There is no distension.     Palpations: Abdomen is soft.     Tenderness: There is no abdominal tenderness. There is no right CVA tenderness or left CVA tenderness.  Musculoskeletal:        General: No swelling.     Cervical back: Neck supple.     Right lower leg: No edema.     Left lower leg: No edema.  Lymphadenopathy:     Cervical: No cervical adenopathy.  Skin:    General: Skin is warm and dry.     Capillary Refill: Capillary refill takes less than 2 seconds.     Findings: No rash.  Neurological:     General: No focal deficit present.     Mental Status: He is alert and oriented to person, place, and time. Mental status is at baseline.     Cranial Nerves: Cranial nerves 2-12 are intact. No dysarthria or facial asymmetry.     Sensory: Sensation is intact.     Motor: Motor function is intact. No weakness or pronator drift.     Coordination: Coordination is intact. Romberg sign negative. Coordination normal. Finger-Nose-Finger Test normal.     Gait: Gait is intact. Gait normal.  Psychiatric:        Attention and Perception: Attention and perception normal.        Mood and Affect: Mood normal.        Speech: Speech normal.        Behavior: Behavior normal. Behavior is cooperative.        Thought Content: Thought content normal.        Cognition and Memory: Cognition and memory normal.         Judgment: Judgment normal.     UC Treatments / Results  Labs (all labs ordered are listed, but only abnormal results are displayed) Labs Reviewed  POCT URINALYSIS DIPSTICK, ED / UC - Abnormal; Notable for the following components:      Result Value   Glucose, UA 500 (*)    Hgb urine dipstick TRACE (*)    Protein, ur 30 (*)    All other components within normal limits  CBG MONITORING, ED - Abnormal; Notable for the following components:   Glucose-Capillary 204 (*)    All other components within normal limits    EKG   Radiology No results found.  Procedures Procedures (including critical care time)  Medications Ordered in UC Medications - No data to display  Initial Impression / Assessment and Plan / UC Course  I have reviewed the triage vital signs and the nursing notes.  Pertinent labs & imaging results that were available during my care of the patient were reviewed by me and considered in my medical decision making (see chart for details).  Patient is a 66 year old   *** Final Clinical Impressions(s) / UC Diagnoses   Final diagnoses:  Infective otitis externa of right ear  Benign paroxysmal positional vertigo, unspecified laterality  Discharge Instructions      You were seen in urgent care for benign positional vertigo.  Take meclizine every 8 hours as needed for dizziness.  Do not take this medication and drink alcohol or drive as it can make you very sleepy.  Follow-up with primary care tomorrow regarding dizziness.  Urine was negative for infection.   Blood sugar was 204 in the clinic today.  Take amoxicillin every 8 hours daily for the next 7 days to treat your infection.  If you develop any new or worsening symptoms or do not improve in the next 2 to 3 days, please return.  If your symptoms are severe, please go to the emergency room.  Follow-up with your primary care provider for further evaluation and management of your symptoms as well as  ongoing wellness visits.  I hope you feel better!     ED Prescriptions     Medication Sig Dispense Auth. Provider   ofloxacin (FLOXIN) 0.3 % OTIC solution  (Status: Discontinued) Place 10 drops into the right ear daily for 7 days. 5 mL Joella Prince M, FNP   meclizine (ANTIVERT) 12.5 MG tablet Take 1 tablet (12.5 mg total) by mouth 3 (three) times daily as needed for dizziness. 30 tablet Joella Prince M, FNP   amoxicillin (AMOXIL) 500 MG capsule Take 1 capsule (500 mg total) by mouth 3 (three) times daily. 21 capsule Talbot Grumbling, FNP      PDMP not reviewed this encounter.

## 2021-06-07 ENCOUNTER — Telehealth: Payer: Self-pay

## 2021-06-07 ENCOUNTER — Encounter: Payer: Self-pay | Admitting: Family

## 2021-06-07 ENCOUNTER — Ambulatory Visit (INDEPENDENT_AMBULATORY_CARE_PROVIDER_SITE_OTHER): Payer: Medicare Other | Admitting: Family

## 2021-06-07 VITALS — BP 128/70 | HR 68 | Temp 97.5°F | Resp 16 | Ht 62.0 in | Wt 137.5 lb

## 2021-06-07 DIAGNOSIS — R42 Dizziness and giddiness: Secondary | ICD-10-CM

## 2021-06-07 DIAGNOSIS — L03115 Cellulitis of right lower limb: Secondary | ICD-10-CM

## 2021-06-07 NOTE — Telephone Encounter (Signed)
Patients spouse called stating that her husband was seen today by Webb Silversmith and was wondering if an order for a CT scan was placed? Patients spouse is aware that an EKG was done in the office however, wants to rule out vertigo.   Please advise. Message routed to [facility].

## 2021-06-07 NOTE — Telephone Encounter (Signed)
Patients daughter called regarding Dinah response and stated that they are urgent for a CT scan to be ordered so they can rule out a inner ear infection. Therefore, they were wondering if a CT scan was placed what would be the next steps?   Message routed to [facility].

## 2021-06-07 NOTE — Telephone Encounter (Signed)
No CT scan ordered.Recommend patient to take Meclizine and antibiotics as prescribed from urgent care first.

## 2021-06-07 NOTE — Progress Notes (Signed)
Provider: Taym Twist FNP-C  Wardell Honour, MD  Patient Care Team: Wardell Honour, MD as PCP - General (Family Medicine)  Extended Emergency Contact Information Primary Emergency Contact: [place] Phone: [phone] Relation: Spouse Secondary Emergency Contact: Jolee Ewing Mobile Phone: [phone] Relation: Other  Code Status: Full Code  Goals of care: Advanced Directive information    06/07/2021   10:11 AM  Advanced Directives  Does Patient Have a Medical Advance Directive? No  Would patient like information on creating a medical advance directive? No - Patient declined     Chief Complaint  Patient presents with   Acute Visit    Patient complains of dizziness.     HPI:  Pt is a 66 y.o. male seen today for an acute visit for evaluation of dizziness x 4 days.Feels like he is going to fall.Head spins.Dizziness worst when getting from bending position to standing. denies any headache,vision changes,fatigue,chest tightness,palpitation,chest pain or shortness of breath.      Past Medical History:  Diagnosis Date   Allergy    Arthritis    hands, back    Cataract    removed right eye    COVID-19    casirivimab\imdevimab infusions    Diabetes mellitus without complication ([place])    History of colonoscopy    Hyperlipidemia    Hypertension    Past Surgical History:  Procedure Laterality Date   CARPAL TUNNEL RELEASE Bilateral    CATARACT EXTRACTION Right    CHOLECYSTECTOMY  1990   COLONOSCOPY     CYST REMOVAL NECK     POLYPECTOMY      Allergies  Allergen Reactions   Losartan     Other reaction(s): Cough   Septra [Bactrim] Nausea And Vomiting   Trulicity [Dulaglutide]     diarrhea   Metformin Diarrhea   Sulfa Antibiotics Rash and Nausea And Vomiting    Outpatient Encounter Medications as of 06/07/2021  Medication Sig   amLODipine (NORVASC) 5 MG tablet Take 1 tablet (5 mg total) by mouth daily.    amoxicillin (AMOXIL) 500 MG capsule Take 1 capsule (500 mg total) by mouth 3 (three) times daily.   diphenhydrAMINE (BENADRYL) 2 % cream Apply topically 3 (three) times daily as needed for itching.   empagliflozin (JARDIANCE) 25 MG TABS tablet Take 1 tablet (25 mg total) by mouth daily.   latanoprost (XALATAN) 0.005 % ophthalmic solution Place 1 drop into both eyes at bedtime.   meclizine (ANTIVERT) 12.5 MG tablet Take 1 tablet (12.5 mg total) by mouth 3 (three) times daily as needed for dizziness.   neomycin-bacitracin-polymyxin [phone] OINT Apply topically as needed.   ofloxacin (FLOXIN) 0.3 % OTIC solution Place 1 drop into the right ear daily.   pantoprazole (PROTONIX) 40 MG tablet Take 1 tablet (40 mg total) by mouth daily.   repaglinide (PRANDIN) 2 MG tablet Take 1 tablet (2 mg total) by mouth 3 (three) times daily before meals.   rosuvastatin (CRESTOR) 40 MG tablet Take 1 tablet (40 mg total) by mouth daily.   Facility-Administered Encounter Medications as of 06/07/2021  Medication   albuterol (VENTOLIN HFA) 108 (90 Base) MCG/ACT inhaler 2 puff    Review of Systems  Constitutional:  Negative for appetite change, chills, fatigue and fever.  HENT:  Negative for congestion, ear discharge, ear pain, postnasal drip, rhinorrhea, sinus pressure, sinus pain, sneezing, sore throat and tinnitus.   Respiratory:  Negative for cough, chest tightness, shortness of breath and wheezing.  Cardiovascular:  Negative for chest pain, palpitations and leg swelling.  Gastrointestinal:  Negative for abdominal distention, abdominal pain, nausea and vomiting.  Neurological:  Positive for dizziness. Negative for syncope, speech difficulty, weakness, light-headedness, numbness and headaches.   Immunization History  Administered Date(s) Administered   Fluad Quad(high Dose 65+) 09/16/2019, 11/24/2020   Influenza-Unspecified 10/28/2013, 10/16/2015, 10/07/2016, 10/11/2019   Janssen (J&J) SARS-COV-2  Vaccination 05/15/2019   Moderna SARS-COV2 Booster Vaccination 05/27/2020   Moderna Sars-Covid-2 Vaccination 11/17/2019   Pneumococcal Polysaccharide-23 11/06/2015   Td 01/08/2010   Zoster Recombinat (Shingrix) 03/13/2018, 07/17/2018   Pertinent  Health Maintenance Due  Topic Date Due   COLONOSCOPY (Pts 45-68yr Insurance coverage will need to be confirmed)  04/11/2019   OPHTHALMOLOGY EXAM  07/04/2020   HEMOGLOBIN A1C  03/06/2021   URINE MICROALBUMIN  05/03/2021   INFLUENZA VACCINE  08/07/2021   FOOT EXAM  02/14/2022      09/05/2020    8:26 AM 02/14/2021    8:24 AM 03/28/2021   10:33 AM 06/06/2021    5:31 PM 06/07/2021   10:11 AM  Fall Risk  Falls in the past year? 0 0 0  0  Was there an injury with Fall? 0 0 0  0  Fall Risk Category Calculator 0 0 0  0  Fall Risk Category Low Low Low  Low  Patient Fall Risk Level Low fall risk Low fall risk Low fall risk Low fall risk Low fall risk  Patient at Risk for Falls Due to No Fall Risks No Fall Risks No Fall Risks  No Fall Risks  Fall risk Follow up Falls evaluation completed;Education provided;Falls prevention discussed Falls evaluation completed;Education provided;Falls prevention discussed Falls evaluation completed  Falls evaluation completed   Functional Status Survey:    Vitals:   06/07/21 1003  BP: 128/70  Pulse: 68  Resp: 16  Temp: (!) 97.5 F (36.4 C)  SpO2: 98%  Weight: 137 lb 8 oz (62.4 kg)  Height: '5\' 2"'$  (1.575 m)   Body mass index is 25.15 kg/m. Physical Exam Vitals reviewed.  Constitutional:      General: He is not in acute distress.    Appearance: Normal appearance. He is normal weight. He is not ill-appearing.  HENT:     Head: Normocephalic.     Right Ear: Tympanic membrane, ear canal and external ear normal. There is no impacted cerumen.     Left Ear: Tympanic membrane, ear canal and external ear normal. There is no impacted cerumen.     Nose: Nose normal. No congestion or rhinorrhea.     Mouth/Throat:      Mouth: Mucous membranes are moist.     Pharynx: Oropharynx is clear. No oropharyngeal exudate or posterior oropharyngeal erythema.  Eyes:     General: No scleral icterus.       Right eye: No discharge.        Left eye: No discharge.     Extraocular Movements: Extraocular movements intact.     Conjunctiva/sclera: Conjunctivae normal.     Pupils: Pupils are equal, round, and reactive to light.  Cardiovascular:     Rate and Rhythm: Normal rate and regular rhythm.     Pulses: Normal pulses.     Heart sounds: Normal heart sounds. No murmur heard.   No friction rub. No gallop.  Pulmonary:     Effort: Pulmonary effort is normal. No respiratory distress.     Breath sounds: Normal breath sounds. No wheezing, rhonchi or rales.  Chest:  Chest wall: No tenderness.  Abdominal:     General: Bowel sounds are normal. There is no distension.     Palpations: Abdomen is soft. There is no mass.     Tenderness: There is no abdominal tenderness. There is no right CVA tenderness, left CVA tenderness, guarding or rebound.  Musculoskeletal:        General: No swelling or tenderness. Normal range of motion.     Cervical back: Normal range of motion. No rigidity or tenderness.     Right lower leg: No edema.     Left lower leg: No edema.  Lymphadenopathy:     Cervical: No cervical adenopathy.  Skin:    General: Skin is warm and dry.     Coloration: Skin is not pale.     Findings: No erythema.     Comments: Left anterior thigh x 2 insect bite areas measuring 2 cm x 1 cm with scab area at the center with surrounding skin perimeter 2 cm erythema,warm to touch.   Neurological:     Mental Status: He is alert and oriented to person, place, and time.     Cranial Nerves: No cranial nerve deficit.     Sensory: No sensory deficit.     Motor: No weakness.     Coordination: Coordination normal.     Gait: Gait normal.  Psychiatric:        Speech: Speech normal.    Labs reviewed: Recent Labs     08/25/20 0806 02/14/21 0903  NA 139 138  K 4.8 4.3  CL 101 103  CO2 32 28  GLUCOSE 129* 133*  BUN 28* 28*  CREATININE 1.30 1.37*  CALCIUM 9.9 9.5   No results for input(s): AST, ALT, ALKPHOS, BILITOT, PROT, ALBUMIN in the last 8760 hours. No results for input(s): WBC, NEUTROABS, HGB, HCT, MCV, PLT in the last 8760 hours. Lab Results  Component Value Date   TSH 1.880 03/12/2017   Lab Results  Component Value Date   HGBA1C 7.1 (H) 09/05/2020   Lab Results  Component Value Date   CHOL 162 02/14/2021   HDL 48 02/14/2021   LDLCALC 95 02/14/2021   TRIG 97 02/14/2021   CHOLHDL 3.4 02/14/2021    Significant Diagnostic Results in last 30 days:  No results found.  Assessment/Plan  Dizziness Afebrile Neuro check intact no significant orthostatics. - EKG 12-Lead indicates normal sinus rhythm heart rate 60 bpm previous EKG done 02/08/2019 indicated sinus tachycardia.   2. Cellulitis of right thigh Afebrile  Left anterior thigh x 2 insect bite areas measuring 2 cm x 1 cm with scab area at the center with surrounding skin perimeter 2 cm erythema,warm to touch.  Family/ staff Communication: Reviewed plan of care with patient verbalized understanding   Labs/tests ordered: - EKG 12-Lead   Next Appointment: As needed if symptoms worsen or fail to improve    Sandrea Hughs, NP

## 2021-06-08 ENCOUNTER — Ambulatory Visit
Admission: RE | Admit: 2021-06-08 | Discharge: 2021-06-08 | Disposition: A | Discharge status: Home / Self Care | Payer: Medicare Other | Source: Ambulatory Visit | Attending: Family | Admitting: Family

## 2021-06-08 DIAGNOSIS — R42 Dizziness and giddiness: Secondary | ICD-10-CM | POA: Diagnosis not present

## 2021-06-08 NOTE — Telephone Encounter (Signed)
Stop ear drops per urgent care.

## 2021-06-08 NOTE — Telephone Encounter (Signed)
Noted  

## 2021-06-08 NOTE — Telephone Encounter (Signed)
Called patient and notified him to STOP taking ear drops per urgent care. But continue amoxicillin, and meclizine as prescribed.

## 2021-06-08 NOTE — Telephone Encounter (Signed)
Will order CT scan imaging center will call you for appointment.

## 2021-06-08 NOTE — Telephone Encounter (Signed)
Spelling error. I made correction.  I meant to state ear drops. Urgent Care stated Infective otitis externa of right ear. Patient was given amoxicillin, and meclizine by provider there. Side effect/risk were explained to patient per urgent care note. Patient states that he was taking ear drops yesterday at appointment. I added ear drops back to medication list as patient directed he was taking them. Looks like Joella Prince, FNP at the Urgent Care discontinued medication during his visit 06/06/2021. This is where the CONFUSION must be coming from. Patient wife must be reading instructions that patient gave me from visit and stating that ear drop bottle states otherwise. Not realizing that Urgent Care provider told patient to STOP taking ear drops. Message routed back to Marlowe Sax, NP

## 2021-06-08 NOTE — Telephone Encounter (Signed)
Message routed to [facility]

## 2021-06-08 NOTE — Telephone Encounter (Addendum)
Patient wife Cormick Moss kept repeating she wanted CT scan right now. I advised her that if she wanted STAT CT scan then she could go to Hospital. Otherwise she would need to call [facility] and schedule CT scan. Check to see when the earliest availability is because PCP has already placed order. Then wife proceeds to talk about patient ear drops. She states that Urgent Care gave patient ear drops. She complains that AVS states for patient to use ear drops a specific way but patient bottle states to use ear drops another way. I advised her to follow the bottle directions, however it would be best to call urgent care to get more clear instructions to help with confusion. She then said that "Its a shame you dont know whats going on with your own patient." I told her that "I do know whats going on with my patient. However it's out of my scope to mingle in other practices directions for patient." She then asked for [facility] number. I provided number for her and she hung the phone up. Message routed to Marlowe Sax, NP for [place]. No further action is required.

## 2021-06-08 NOTE — Telephone Encounter (Signed)
Please verify with patient drops that was prescribed in Urgent care.According to Urgent care notes no eye drops were prescribed but I do see ear drops on chart. Please clarify with patient.

## 2021-06-08 NOTE — Telephone Encounter (Signed)
Transferred message to [place], [place]

## 2021-08-07 ENCOUNTER — Other Ambulatory Visit: Payer: Self-pay

## 2021-08-07 DIAGNOSIS — I10 Essential (primary) hypertension: Secondary | ICD-10-CM

## 2021-08-07 DIAGNOSIS — E1129 Type 2 diabetes mellitus with other diabetic kidney complication: Secondary | ICD-10-CM

## 2021-08-07 DIAGNOSIS — E785 Hyperlipidemia, unspecified: Secondary | ICD-10-CM

## 2021-08-07 MED ORDER — ROSUVASTATIN CALCIUM 40 MG PO TABS: 40.0000 mg | ORAL_TABLET | Freq: Every day | ORAL | 1 refills | Status: DC

## 2021-08-07 MED ORDER — EMPAGLIFLOZIN 25 MG PO TABS: 25.0000 mg | ORAL_TABLET | Freq: Every day | ORAL | 1 refills | Status: DC

## 2021-08-07 MED ORDER — AMLODIPINE BESYLATE 5 MG PO TABS: 5.0000 mg | ORAL_TABLET | Freq: Every day | ORAL | 1 refills | Status: DC

## 2021-09-18 ENCOUNTER — Ambulatory Visit: Payer: Medicare Other | Admitting: Podiatry

## 2021-09-18 DIAGNOSIS — M79675 Pain in left toe(s): Secondary | ICD-10-CM | POA: Diagnosis not present

## 2021-09-18 DIAGNOSIS — R809 Proteinuria, unspecified: Secondary | ICD-10-CM | POA: Diagnosis not present

## 2021-09-18 DIAGNOSIS — B351 Tinea unguium: Secondary | ICD-10-CM | POA: Diagnosis not present

## 2021-09-18 DIAGNOSIS — B353 Tinea pedis: Secondary | ICD-10-CM

## 2021-09-18 DIAGNOSIS — E1129 Type 2 diabetes mellitus with other diabetic kidney complication: Secondary | ICD-10-CM | POA: Diagnosis not present

## 2021-09-18 DIAGNOSIS — M79674 Pain in right toe(s): Secondary | ICD-10-CM | POA: Diagnosis not present

## 2021-09-18 MED ORDER — KETOCONAZOLE 2 % EX CREA: 1.0000 | TOPICAL_CREAM | Freq: Every day | CUTANEOUS | 2 refills | Status: DC

## 2021-09-18 NOTE — Progress Notes (Signed)
  Subjective:  Patient ID: Jesse Baldwin, male    DOB: [birth year],  MRN: [id number]  Chief Complaint  Patient presents with   Foot Problem    diabetic foot evaluation-nail trim/dry and cracked- BS was checked a couple of days ago and it was 34.     66 y.o. male presents with the above complaint. History confirmed with patient.   Objective:  Physical Exam: warm, good capillary refill, no trophic changes or ulcerative lesions, normal DP and PT pulses, normal sensory exam, and tinea pedis.  Normal monofilament exam Left Foot: dystrophic yellowed discolored nail plates with subungual debris Right Foot: dystrophic yellowed discolored nail plates with subungual debris  Assessment:   1. Tinea pedis of both feet   2. Pain due to onychomycosis of toenails of both feet   3. Type 2 diabetes mellitus with microalbuminuria, without long-term current use of insulin (Jesse Baldwin)      Plan:  Patient was evaluated and treated and all questions answered.  Patient educated on diabetes. Discussed proper diabetic foot care and discussed risks and complications of disease. Educated patient in depth on reasons to return to the office immediately should he/she discover anything concerning or new on the feet. All questions answered. Discussed proper shoes as well.  No evidence of active neuropathy  Discussed the etiology and treatment options for the condition in detail with the patient. Educated patient on the topical and oral treatment options for mycotic nails. Recommended debridement of the nails today. Sharp and mechanical debridement performed of all painful and mycotic nails today. Nails debrided in length and thickness using a nail nipper to level of comfort. Discussed treatment options including appropriate shoe gear. Follow up as needed for painful nails.   Discussed the etiology and treatment options for tinea pedis.  Discussed topical and oral treatment.  Recommended topical treatment with 2%  ketoconazole cream.  This was sent to the patient's pharmacy.  Also discussed appropriate foot hygiene, use of antifungal spray such as Tinactin in shoes, as well as cleaning foot surfaces such as showers and bathroom floors with bleach.   Return in about 1 year (around 09/19/2022) for diabetic foot exam .

## 2021-09-19 ENCOUNTER — Telehealth: Payer: Self-pay

## 2021-09-19 ENCOUNTER — Ambulatory Visit (INDEPENDENT_AMBULATORY_CARE_PROVIDER_SITE_OTHER): Payer: Medicare Other | Admitting: Family Medicine

## 2021-09-19 ENCOUNTER — Encounter: Payer: Self-pay | Admitting: Family Medicine

## 2021-09-19 VITALS — BP 156/72 | HR 69 | Temp 97.7°F | Ht 62.0 in | Wt 139.2 lb

## 2021-09-19 DIAGNOSIS — E785 Hyperlipidemia, unspecified: Secondary | ICD-10-CM

## 2021-09-19 DIAGNOSIS — E1129 Type 2 diabetes mellitus with other diabetic kidney complication: Secondary | ICD-10-CM

## 2021-09-19 DIAGNOSIS — I10 Essential (primary) hypertension: Secondary | ICD-10-CM

## 2021-09-19 DIAGNOSIS — M65359 Trigger finger, unspecified little finger: Secondary | ICD-10-CM

## 2021-09-19 DIAGNOSIS — R809 Proteinuria, unspecified: Secondary | ICD-10-CM

## 2021-09-19 NOTE — Telephone Encounter (Signed)
Patient's daughter, Ivin Booty called stating that bug bites on patient's leg and belly have not been getting better. She states that areas are still purple in color, raised, but no pus or anything. She would like to know if there is anything they can do or do they need to come back in.  Message routed to Dr. Wardell Honour

## 2021-09-19 NOTE — Progress Notes (Signed)
Provider:  Alain Honey, MD  Careteam: Patient Care Team: Wardell Honour, MD as PCP - General (Family Medicine)  PLACE OF SERVICE:  [place]  Advanced Directive information    Allergies  Allergen Reactions   Losartan     Other reaction(s): Cough   Septra [Bactrim] Nausea And Vomiting   Trulicity [Dulaglutide]     diarrhea   Metformin Diarrhea   Sulfa Antibiotics Rash and Nausea And Vomiting    No chief complaint on file.    HPI: Patient is a 66 y.o. male this is a 88-monthfollow-up and management for management of chronic problems including type 2 diabetes hypertension and chronic kidney disease.  He has no specific complaints today except some discomfort in his fifth finger on the left hand.  There is some suggestion of triggering.  He has had previous carpal tunnel surgery on that hand. He is on high-dose rosuvastatin but LDL is still only 95.  Goal would be LDL less than 70 with his diabetes.  Review of Systems:  Review of Systems  Constitutional: Negative.   Respiratory: Negative.    Cardiovascular: Negative.   Musculoskeletal:        See HPI above  Neurological: Negative.   Psychiatric/Behavioral: Negative.    All other systems reviewed and are negative.   Past Medical History:  Diagnosis Date   Allergy    Arthritis    hands, back    Cataract    removed right eye    COVID-19    casirivimab\imdevimab infusions    [place]    History of colonoscopy    Hyperlipidemia    Hypertension    Past Surgical History:  Procedure Laterality Date   CARPAL TUNNEL RELEASE Bilateral    CATARACT EXTRACTION Right    CHOLECYSTECTOMY  1990   COLONOSCOPY     CYST REMOVAL NECK     POLYPECTOMY     Social History:   reports that he quit smoking about 40 years ago. His smoking use included cigarettes. He has never used smokeless tobacco. He reports that he does not drink alcohol and does not use drugs.  Family History   Problem Relation Age of Onset   Diabetes Mother 671  Hypertension Mother    Diabetes Father 856  Hypertension Father    Colon cancer Neg Hx    Colon polyps Neg Hx    Esophageal cancer Neg Hx    Rectal cancer Neg Hx    Stomach cancer Neg Hx     Medications: Patient's Medications  New Prescriptions   No medications on file  Previous Medications   AMLODIPINE (NORVASC) 5 MG TABLET    Take 1 tablet (5 mg total) by mouth daily.   AMOXICILLIN (AMOXIL) 500 MG CAPSULE    Take 1 capsule (500 mg total) by mouth 3 (three) times daily.   DIPHENHYDRAMINE (BENADRYL) 2 % CREAM    Apply topically 3 (three) times daily as needed for itching.   EMPAGLIFLOZIN (JARDIANCE) 25 MG TABS TABLET    Take 1 tablet (25 mg total) by mouth daily.   KETOCONAZOLE (NIZORAL) 2 % CREAM    Apply 1 Application topically daily.   LATANOPROST (XALATAN) 0.005 % OPHTHALMIC SOLUTION    Place 1 drop into both eyes at bedtime.   MECLIZINE (ANTIVERT) 12.5 MG TABLET    Take 1 tablet (12.5 mg total) by mouth 3 (three) times daily as needed for dizziness.   NEOMYCIN-BACITRACIN-POLYMYXIN [phone] OINT  Apply topically as needed.   OFLOXACIN (FLOXIN) 0.3 % OTIC SOLUTION    Place 1 drop into the right ear daily.   PANTOPRAZOLE (PROTONIX) 40 MG TABLET    Take 1 tablet (40 mg total) by mouth daily.   REPAGLINIDE (PRANDIN) 2 MG TABLET    Take 1 tablet (2 mg total) by mouth 3 (three) times daily before meals.   ROSUVASTATIN (CRESTOR) 40 MG TABLET    Take 1 tablet (40 mg total) by mouth daily.  Modified Medications   No medications on file  Discontinued Medications   No medications on file    Physical Exam:  Vitals:   09/19/21 0955  BP: (!) 156/72  Pulse: 69  Temp: 97.7 F (36.5 C)  SpO2: 98%  Weight: 139 lb 3.2 oz (63.1 kg)  Height: '5\' 2"'$  (1.575 m)   Body mass index is 25.46 kg/m. Wt Readings from Last 3 Encounters:  09/19/21 139 lb 3.2 oz (63.1 kg)  06/07/21 137 lb 8 oz (62.4 kg)  03/28/21 138 lb 12.8 oz (63 kg)     Physical Exam Vitals and nursing note reviewed.  Constitutional:      Appearance: Normal appearance.  HENT:     Head: Normocephalic.  Cardiovascular:     Rate and Rhythm: Normal rate and regular rhythm.     Pulses: Normal pulses.     Heart sounds: Normal heart sounds.  Pulmonary:     Effort: Pulmonary effort is normal.     Breath sounds: Normal breath sounds.  Musculoskeletal:     Cervical back: Normal range of motion.     Comments: He may have Dupuytren's causing the triggering.  Would like to send him to Ortho hand for further evaluation  Neurological:     General: No focal deficit present.     Mental Status: He is alert. He is disoriented.  Psychiatric:        Mood and Affect: Mood normal.     Labs reviewed: Basic Metabolic Panel: Recent Labs    02/14/21 0903  NA 138  K 4.3  CL 103  CO2 28  GLUCOSE 133*  BUN 28*  CREATININE 1.37*  CALCIUM 9.5   Liver Function Tests: No results for input(s): "AST", "ALT", "ALKPHOS", "BILITOT", "PROT", "ALBUMIN" in the last 8760 hours. No results for input(s): "LIPASE", "AMYLASE" in the last 8760 hours. No results for input(s): "AMMONIA" in the last 8760 hours. CBC: No results for input(s): "WBC", "NEUTROABS", "HGB", "HCT", "MCV", "PLT" in the last 8760 hours. Lipid Panel: Recent Labs    02/14/21 0903  CHOL 162  HDL 48  LDLCALC 95  TRIG 97  CHOLHDL 3.4   TSH: No results for input(s): "TSH" in the last 8760 hours. A1C: Lab Results  Component Value Date   HGBA1C 7.1 (H) 09/05/2020     Assessment/Plan  1. Type 2 diabetes mellitus with microalbuminuria, without long-term current use of insulin (H[facility]) Need to reassess A1c.  Has been doing good on current regimen of Jardiance and Prandin  2. Primary hypertension Blood pressure up a little bit today 156/72.  Last visit was 120/68 medications include amlodipine 5 mg  3. Hyperlipidemia, unspecified hyperlipidemia type We will check lipids today.  Consider addition  of Zetia if LDL is still as high as last visit  4. Trigger little finger, unspecified laterality Refer to hand specialist for further evaluation and possible treatment   Alain Honey, MD [place] [phone]

## 2021-09-20 ENCOUNTER — Ambulatory Visit (INDEPENDENT_AMBULATORY_CARE_PROVIDER_SITE_OTHER): Payer: Medicare Other | Admitting: Adult Health

## 2021-09-20 ENCOUNTER — Encounter: Payer: Self-pay | Admitting: Adult Health

## 2021-09-20 VITALS — BP 136/68 | HR 81 | Resp 18 | Ht 62.0 in | Wt 139.0 lb

## 2021-09-20 DIAGNOSIS — E1129 Type 2 diabetes mellitus with other diabetic kidney complication: Secondary | ICD-10-CM

## 2021-09-20 DIAGNOSIS — R809 Proteinuria, unspecified: Secondary | ICD-10-CM | POA: Diagnosis not present

## 2021-09-20 DIAGNOSIS — S70362S Insect bite (nonvenomous), left thigh, sequela: Secondary | ICD-10-CM

## 2021-09-20 DIAGNOSIS — W57XXXS Bitten or stung by nonvenomous insect and other nonvenomous arthropods, sequela: Secondary | ICD-10-CM

## 2021-09-20 LAB — BASIC METABOLIC PANEL WITH GFR
BUN: 21 mg/dL (ref 7–25)
CO2: 25 mmol/L (ref 20–32)
Calcium: 9.7 mg/dL (ref 8.6–10.3)
Chloride: 104 mmol/L (ref 98–110)
Creat: 1.32 mg/dL (ref 0.70–1.35)
Glucose, Bld: 130 mg/dL — ABNORMAL HIGH (ref 65–99)
Potassium: 4.2 mmol/L (ref 3.5–5.3)
Sodium: 138 mmol/L (ref 135–146)
eGFR: 59 mL/min/{1.73_m2} — ABNORMAL LOW (ref >=60)

## 2021-09-20 LAB — CBC WITH DIFFERENTIAL/PLATELET
Absolute Monocytes: 576 cells/uL (ref 200–950)
Basophils Absolute: 40 cells/uL (ref 0–200)
Basophils Relative: 0.5 %
Eosinophils Absolute: 152 cells/uL (ref 15–500)
Eosinophils Relative: 1.9 %
HCT: 52.6 % — ABNORMAL HIGH (ref 38.5–50.0)
Hemoglobin: 17.1 g/dL (ref 13.2–17.1)
Lymphs Abs: 1752 cells/uL (ref 850–3900)
MCH: 28.5 pg (ref 27.0–33.0)
MCHC: 32.5 g/dL (ref 32.0–36.0)
MCV: 87.7 fL (ref 80.0–100.0)
MPV: 11.9 fL (ref 7.5–12.5)
Monocytes Relative: 7.2 %
Neutro Abs: 5480 cells/uL (ref 1500–7800)
Neutrophils Relative %: 68.5 %
Platelets: 164 10*3/uL (ref 140–400)
RBC: 6 10*6/uL — ABNORMAL HIGH (ref 4.20–5.80)
RDW: 13.8 % (ref 11.0–15.0)
Total Lymphocyte: 21.9 %
WBC: 8 10*3/uL (ref 3.8–10.8)

## 2021-09-20 LAB — HEMOGLOBIN A1C
Hgb A1c MFr Bld: 7.5 % of total Hgb — ABNORMAL HIGH (ref <5.7)
Mean Plasma Glucose: 169 mg/dL
eAG (mmol/L): 9.3 mmol/L

## 2021-09-20 LAB — LIPID PANEL
Cholesterol: 146 mg/dL (ref <200)
HDL: 54 mg/dL (ref >=40)
LDL Cholesterol (Calc): 78 mg/dL (calc)
Non-HDL Cholesterol (Calc): 92 mg/dL (calc) (ref <130)
Total CHOL/HDL Ratio: 2.7 (calc) (ref <5.0)
Triglycerides: 68 mg/dL (ref <150)

## 2021-09-20 LAB — MICROALBUMIN / CREATININE URINE RATIO
Creatinine, Urine: 69 mg/dL (ref 20–320)
Microalb Creat Ratio: 151 mcg/mg creat — ABNORMAL HIGH (ref <30)
Microalb, Ur: 10.4 mg/dL

## 2021-09-20 NOTE — Patient Instructions (Signed)
Insect Bite, Adult An insect bite can make your skin red, itchy, and swollen. Some insects can spread disease to people with a bite. However, most insect bites do not lead to disease, and most are not serious. What are the causes? Insects may bite for many reasons, including: Hunger. To defend themselves. Insects that bite include: Spiders. Mosquitoes. Flies. Ticks and fleas. Ants. Kissing bugs. Chiggers. What are the signs or symptoms? Symptoms often last for 2-4 days. However, itching can last up to 10 days. Symptoms include: Itching or pain in the bite area. Redness and swelling in the bite area. An open wound. In rare cases, a person may have a very bad allergic reaction (anaphylactic reaction) to a bite. Symptoms of an anaphylactic reaction may include: Feeling warm in the face (flushed). Your face may turn red. Itchy, red, swollen areas of skin (hives). Swelling of the eyes, lips, face, mouth, tongue, or throat. Trouble with breathing, talking, or swallowing. High-pitched whistling sounds, most often when breathing out (wheezing). Feeling dizzy or light-headed. Fainting. Pain or cramps in your belly (abdomen). Vomiting. Watery poop (diarrhea). How is this treated? Most insect bites are not serious. Symptoms often go away on their own. When treatment is advised, it may include: Putting ice on the bite area. Putting a cream or lotion, like calamine lotion, on the bite area. This helps with itching. Using medicines called antihistamines. You may also need: A tetanus shot if you are not up to date. An antibiotic cream or medicine. This treatment is needed if the bite area gets infected. Follow these instructions at home: Bite area care  Do not scratch the bite area. It may help to cover the bite area with a bandage or close-fitting clothing. Keep the bite area clean and dry. Check the bite area every day for signs of infection. Check for: More redness, swelling, or  pain. Fluid or blood. Warmth. Pus or a bad smell. Wash your hands often. Managing pain, itching, and swelling  You may put any of these on the bite area as told by your doctor: A paste made of baking soda and water. Cortisone cream. Calamine lotion. If told, put ice on the bite area. To do this: Put ice in a plastic bag. Place a towel between your skin and the bag. Leave the ice on for 20 minutes, 2-3 times a day. If your skin turns bright red, take off the ice right away to prevent skin damage. The risk of skin damage is higher if you cannot feel pain, heat, or cold. General instructions Apply or take over-the-counter and prescription medicines only as told by your doctor. If you were prescribed antibiotics, take or apply them as told by your doctor. Do not stop using them even if you start to feel better. How is this prevented? To help you have a lower risk of insect bites: When you are outside, wear clothes that cover your arms and legs. Use insect repellent. The best insect repellents contain one of these: DEET. Picaridin. Oil of lemon eucalyptus (OLE). IR3535. Consider spraying your clothing with a pesticide called permethrin. Permethrin helps prevent insect bites. It works for several weeks and for up to 5-6 clothing washes. Do not apply permethrin directly to the skin. If your home windows do not have screens, think about putting some in. If you will be sleeping in an area where there are mosquitoes, consider covering your sleeping area with a mosquito net. Contact a doctor if: You have redness, swelling, or pain   in the bite area. You have fluid or blood coming from the bite area. The bite area feels warm to the touch. You have pus or a bad smell coming from the bite area. You have a fever. Get help right away if: You have joint pain. You have a rash. You feel weak or more tired than you normally do. You have neck pain or a headache. You have signs of an anaphylactic  reaction. Signs may include: Swelling of your eyes, lips, face, mouth, tongue, or throat. Feeling warm in the face. Itchy, red, swollen areas of skin. Trouble with breathing, talking, or swallowing. Wheezing. Feeling dizzy or light-headed. Fainting. Pain or cramps in your belly. Vomiting or watery poop. These symptoms may be an emergency. Get help right away. Call 911. Do not wait to see if symptoms will go away. Do not drive yourself to the hospital. Summary An insect bite can make your skin red, itchy, and swollen. Treatment is usually not needed. Symptoms often go away on their own. Do not scratch the bite area. Keep it clean and dry. Use insect repellent to help prevent insect bites. Contact a doctor if you have signs of infection. This information is not intended to replace advice given to you by your health care provider. Make sure you discuss any questions you have with your health care provider. Document Revised: 03/20/2021 Document Reviewed: 03/20/2021 Elsevier Patient Education  2023 Elsevier Inc.  

## 2021-09-20 NOTE — Progress Notes (Signed)
[facility] clinic  Provider:   Durenda Age DNP  Code Status:  Full Code  Goals of Care:     06/07/2021   10:11 AM  Advanced Directives  Does Patient Have a Medical Advance Directive? No  Would patient like information on creating a medical advance directive? No - Patient declined     Chief Complaint  Patient presents with   Insect Bite    HPI: Patient is a 66 y.o. male seen today for an acute visit for for rashes. He was bitten by a bug last April 2023 when he went on vacation in [place]. He now has a scab on his navel and left thigh. Scab is dry and no erythema noted.He is worried that it might get worse since he is diabetic. He takes Jardiance 25 mg daily and Prandin 2 mg TID for diabetes mellitus. Last A1c was 7.5 (09/19/21).  Past Medical History:  Diagnosis Date   Allergy    Arthritis    hands, back    Cataract    removed right eye    COVID-19    casirivimab\imdevimab infusions    Diabetes mellitus without complication ([place])    History of colonoscopy    Hyperlipidemia    Hypertension     Past Surgical History:  Procedure Laterality Date   CARPAL TUNNEL RELEASE Bilateral    CATARACT EXTRACTION Right    CHOLECYSTECTOMY  1990   COLONOSCOPY     CYST REMOVAL NECK     POLYPECTOMY      Allergies  Allergen Reactions   Losartan     Other reaction(s): Cough   Septra [Bactrim] Nausea And Vomiting   Trulicity [Dulaglutide]     diarrhea   Metformin Diarrhea   Sulfa Antibiotics Rash and Nausea And Vomiting    Outpatient Encounter Medications as of 09/20/2021  Medication Sig   amLODipine (NORVASC) 5 MG tablet Take 1 tablet (5 mg total) by mouth daily.   amoxicillin (AMOXIL) 500 MG capsule Take 1 capsule (500 mg total) by mouth 3 (three) times daily.   diphenhydrAMINE (BENADRYL) 2 % cream Apply topically 3 (three) times daily as needed for itching.   empagliflozin (JARDIANCE) 25 MG TABS tablet Take 1 tablet (25 mg total) by mouth daily.   ketoconazole  (NIZORAL) 2 % cream Apply 1 Application topically daily.   latanoprost (XALATAN) 0.005 % ophthalmic solution Place 1 drop into both eyes at bedtime.   meclizine (ANTIVERT) 12.5 MG tablet Take 1 tablet (12.5 mg total) by mouth 3 (three) times daily as needed for dizziness.   neomycin-bacitracin-polymyxin [phone] OINT Apply topically as needed.   ofloxacin (FLOXIN) 0.3 % OTIC solution Place 1 drop into the right ear daily.   pantoprazole (PROTONIX) 40 MG tablet Take 1 tablet (40 mg total) by mouth daily.   repaglinide (PRANDIN) 2 MG tablet Take 1 tablet (2 mg total) by mouth 3 (three) times daily before meals.   rosuvastatin (CRESTOR) 40 MG tablet Take 1 tablet (40 mg total) by mouth daily.   Facility-Administered Encounter Medications as of 09/20/2021  Medication   albuterol (VENTOLIN HFA) 108 (90 Base) MCG/ACT inhaler 2 puff    Review of Systems:  Review of Systems  Constitutional:  Negative for activity change, appetite change and fever.  HENT:  Negative for sore throat.   Eyes: Negative.   Cardiovascular:  Negative for chest pain and leg swelling.  Gastrointestinal:  Negative for abdominal distention, diarrhea and vomiting.  Genitourinary:  Negative for dysuria, frequency and  urgency.  Skin:  Positive for rash. Negative for color change.  Neurological:  Negative for dizziness and headaches.  Psychiatric/Behavioral:  Negative for behavioral problems and sleep disturbance. The patient is not nervous/anxious.     Health Maintenance  Topic Date Due   Pneumonia Vaccine 18+ Years old (2 - PCV) 11/18/2018   COLONOSCOPY (Pts 45-36yr Insurance coverage will need to be confirmed)  04/11/2019   TETANUS/TDAP  01/09/2020   COVID-19 Vaccine (3 - Booster for Janssen series) 07/22/2020   INFLUENZA VACCINE  08/07/2021   FOOT EXAM  02/14/2022   HEMOGLOBIN A1C  03/20/2022   OPHTHALMOLOGY EXAM  05/15/2022   Diabetic kidney evaluation - GFR measurement  09/20/2022   Diabetic kidney evaluation  - Urine ACR  09/20/2022   Hepatitis C Screening  Completed   Zoster Vaccines- Shingrix  Completed   HPV VACCINES  Aged Out    Physical Exam: Vitals:   09/20/21 1415  BP: 136/68  Pulse: 81  Resp: 18  SpO2: 98%  Weight: 139 lb (63 kg)  Height: '5\' 2"'$  (1.575 m)   Body mass index is 25.42 kg/m. Physical Exam Constitutional:      Appearance: Normal appearance.  HENT:     Head: Normocephalic and atraumatic.     Mouth/Throat:     Mouth: Mucous membranes are moist.  Eyes:     Conjunctiva/sclera: Conjunctivae normal.  Cardiovascular:     Rate and Rhythm: Normal rate and regular rhythm.     Pulses: Normal pulses.     Heart sounds: Normal heart sounds.  Pulmonary:     Effort: Pulmonary effort is normal.     Breath sounds: Normal breath sounds.  Abdominal:     General: Bowel sounds are normal.     Palpations: Abdomen is soft.  Musculoskeletal:        General: No swelling. Normal range of motion.     Cervical back: Normal range of motion.  Skin:    General: Skin is warm and dry.     Comments: Scab on left thigh and navel.  Neurological:     General: No focal deficit present.     Mental Status: He is alert and oriented to person, place, and time.  Psychiatric:        Mood and Affect: Mood normal.        Behavior: Behavior normal.        Thought Content: Thought content normal.        Judgment: Judgment normal.    Labs reviewed: Basic Metabolic Panel: Recent Labs    02/14/21 0903 09/19/21 1027  NA 138 138  K 4.3 4.2  CL 103 104  CO2 28 25  GLUCOSE 133* 130*  BUN 28* 21  CREATININE 1.37* 1.32  CALCIUM 9.5 9.7   Liver Function Tests: No results for input(s): "AST", "ALT", "ALKPHOS", "BILITOT", "PROT", "ALBUMIN" in the last 8760 hours. No results for input(s): "LIPASE", "AMYLASE" in the last 8760 hours. No results for input(s): "AMMONIA" in the last 8760 hours. CBC: Recent Labs    09/19/21 1027  WBC 8.0  NEUTROABS 5,480  HGB 17.1  HCT 52.6*  MCV 87.7  PLT  164   Lipid Panel: Recent Labs    02/14/21 0903 09/19/21 1027  CHOL 162 146  HDL 48 54  LDLCALC 95 78  TRIG 97 68  CHOLHDL 3.4 2.7   Lab Results  Component Value Date   HGBA1C 7.5 (H) 09/19/2021    Procedures since last visit: No results  found.  Assessment/Plan  1. Insect bite of left thigh, sequela -  no noted infection, now scabbed, no redness  2. Type 2 diabetes mellitus with microalbuminuria, without long-term current use of insulin (HCC) Lab Results  Component Value Date   HGBA1C 7.5 (H) 09/19/2021   -  stable -  continue Jardiance and Prandin -  monitor CBGs   Labs/tests ordered:  None  Next appt:  03/13/2022

## 2021-09-20 NOTE — Telephone Encounter (Signed)
Daughter would like for patient to be reevualted. Appointment made for today 9/14 with Monina Medina-Vargas

## 2021-10-03 DIAGNOSIS — M65351 Trigger finger, right little finger: Secondary | ICD-10-CM | POA: Diagnosis not present

## 2021-11-19 ENCOUNTER — Other Ambulatory Visit: Payer: Self-pay

## 2021-11-19 DIAGNOSIS — E1129 Type 2 diabetes mellitus with other diabetic kidney complication: Secondary | ICD-10-CM

## 2021-11-19 MED ORDER — REPAGLINIDE 2 MG PO TABS: 2.0000 mg | ORAL_TABLET | Freq: Three times a day (TID) | ORAL | 1 refills | Status: DC

## 2021-12-17 ENCOUNTER — Other Ambulatory Visit: Payer: Self-pay | Admitting: Family Medicine

## 2021-12-17 DIAGNOSIS — I10 Essential (primary) hypertension: Secondary | ICD-10-CM

## 2021-12-17 DIAGNOSIS — E785 Hyperlipidemia, unspecified: Secondary | ICD-10-CM

## 2021-12-17 NOTE — Telephone Encounter (Signed)
Patient called and requested a refill for Crestor and Amlodipine. Medications was send into pharmacy.

## 2022-03-13 ENCOUNTER — Ambulatory Visit: Payer: Medicare Other | Admitting: Family Medicine

## 2022-06-12 ENCOUNTER — Encounter: Payer: Self-pay | Admitting: Family Medicine

## 2022-07-19 ENCOUNTER — Other Ambulatory Visit: Payer: Self-pay

## 2022-07-19 MED ORDER — LATANOPROST 0.005 % OP SOLN: 1.0000 [drp] | Freq: Every day | OPHTHALMIC | 3 refills | Status: DC

## 2022-08-15 ENCOUNTER — Other Ambulatory Visit: Payer: Self-pay

## 2022-08-15 DIAGNOSIS — E785 Hyperlipidemia, unspecified: Secondary | ICD-10-CM

## 2022-08-15 DIAGNOSIS — E1129 Type 2 diabetes mellitus with other diabetic kidney complication: Secondary | ICD-10-CM

## 2022-08-15 DIAGNOSIS — I10 Essential (primary) hypertension: Secondary | ICD-10-CM

## 2022-08-23 ENCOUNTER — Other Ambulatory Visit: Payer: Medicare Other

## 2022-08-23 DIAGNOSIS — R809 Proteinuria, unspecified: Secondary | ICD-10-CM | POA: Diagnosis not present

## 2022-08-23 DIAGNOSIS — E1129 Type 2 diabetes mellitus with other diabetic kidney complication: Secondary | ICD-10-CM | POA: Diagnosis not present

## 2022-08-23 DIAGNOSIS — I10 Essential (primary) hypertension: Secondary | ICD-10-CM | POA: Diagnosis not present

## 2022-08-23 DIAGNOSIS — E785 Hyperlipidemia, unspecified: Secondary | ICD-10-CM | POA: Diagnosis not present

## 2022-08-24 LAB — COMPLETE METABOLIC PANEL WITH GFR: BUN/Creatinine Ratio: 15 (calc) (ref 6–22)

## 2022-08-24 LAB — LIPID PANEL
Cholesterol: 140 mg/dL (ref <200)
HDL: 43 mg/dL (ref >=40)

## 2022-08-28 ENCOUNTER — Encounter: Payer: Self-pay | Admitting: Family Medicine

## 2022-08-28 ENCOUNTER — Ambulatory Visit (INDEPENDENT_AMBULATORY_CARE_PROVIDER_SITE_OTHER): Payer: Medicare Other | Admitting: Family Medicine

## 2022-08-28 VITALS — BP 146/70 | HR 77 | Temp 97.5°F | Ht 62.0 in | Wt 136.0 lb

## 2022-08-28 DIAGNOSIS — E1129 Type 2 diabetes mellitus with other diabetic kidney complication: Secondary | ICD-10-CM | POA: Diagnosis not present

## 2022-08-28 DIAGNOSIS — R809 Proteinuria, unspecified: Secondary | ICD-10-CM | POA: Diagnosis not present

## 2022-08-28 DIAGNOSIS — E785 Hyperlipidemia, unspecified: Secondary | ICD-10-CM

## 2022-08-28 DIAGNOSIS — I10 Essential (primary) hypertension: Secondary | ICD-10-CM | POA: Diagnosis not present

## 2022-08-28 NOTE — Progress Notes (Signed)
Provider:  Jacalyn Lefevre, MD  Careteam: Patient Care Team: Frederica Kuster, MD as PCP - General (Family Medicine) Chucky May, M.D., PA  PLACE OF SERVICE:  [facility]  Advanced Directive information Does Patient Have a Medical Advance Directive?: No;Yes, Type of Advance Directive: Healthcare Power of [place];Living will, Does patient want to make changes to medical advance directive?: No - Patient declined  Allergies  Allergen Reactions   Losartan     Other reaction(s): Cough   Septra [Bactrim] Nausea And Vomiting   Trulicity [Dulaglutide]     diarrhea   Metformin Diarrhea   Sulfa Antibiotics Rash and Nausea And Vomiting    Chief Complaint  Patient presents with   Medication management of chronic issues    11 month follow up and foot exam.. Discuss the need for AWV, pneumonia , Colonoscopy, TD, flu, eye exam, and Diabetic kidney evaluation. Discuss jardiance, patient switched back to metformin on his on. Discuss protonix, patient is taking as needed versus daily as prescribed.      HPI: Patient is a 67 y.o. male patient is here to follow-up diabetes and chronic kidney disease.  He has been in [place] he is from for several months.  He ran out of [place] and could not get it in the [place] but was given metformin.  Given his elevated creatinine metformin is probably not best choice for for his diabetes.  He had lab work last week.  A1c has been risen from 7.5-8.4.  Also admits to not eating food like he should have avoiding carbs and [place]  I explained that we need to discontinue metformin and restart Jardiance but he said Jardiance was expensive.  I did provide him with some patient assistance paperwork that he could fill out to try to get help on paying for Jardiance.  Also takes Prandin with meals  Review of Systems:  Review of Systems  Constitutional: Negative.   HENT: Negative.    Eyes: Negative.   Cardiovascular: Negative.    Gastrointestinal: Negative.   Genitourinary: Negative.   Skin: Negative.   Neurological: Negative.   Psychiatric/Behavioral: Negative.      Past Medical History:  Diagnosis Date   Allergy    Arthritis    hands, back    Cataract    removed right eye    COVID-19    casirivimab\imdevimab infusions    Diabetes mellitus without complication (HCC)    History of colonoscopy    Hyperlipidemia    Hypertension    Past Surgical History:  Procedure Laterality Date   CARPAL TUNNEL RELEASE Bilateral    CATARACT EXTRACTION Right    CHOLECYSTECTOMY  1990   COLONOSCOPY     CYST REMOVAL NECK     POLYPECTOMY     Social History:   reports that he quit smoking about 41 years ago. His smoking use included cigarettes. He has never used smokeless tobacco. He reports that he does not drink alcohol and does not use drugs.  Family History  Problem Relation Age of Onset   Diabetes Mother 26   Hypertension Mother    Diabetes Father 29   Hypertension Father    Colon cancer Neg Hx    Colon polyps Neg Hx    Esophageal cancer Neg Hx    Rectal cancer Neg Hx    Stomach cancer Neg Hx     Medications: Patient's Medications  New Prescriptions   No medications on file  Previous Medications   AMLODIPINE (NORVASC)  5 MG TABLET    TAKE 1 TABLET (5 MG TOTAL) BY MOUTH DAILY.   DIPHENHYDRAMINE (BENADRYL) 2 % CREAM    Apply topically 3 (three) times daily as needed for itching.   EMPAGLIFLOZIN (JARDIANCE) 25 MG TABS TABLET    Take 1 tablet (25 mg total) by mouth daily.   KETOCONAZOLE (NIZORAL) 2 % CREAM    Apply 1 Application topically daily.   LATANOPROST (XALATAN) 0.005 % OPHTHALMIC SOLUTION    Place 1 drop into both eyes at bedtime.   MECLIZINE (ANTIVERT) 12.5 MG TABLET    Take 1 tablet (12.5 mg total) by mouth 3 (three) times daily as needed for dizziness.   METFORMIN (GLUCOPHAGE) 500 MG TABLET    Take 500 mg by mouth. 1-2 times daily depending on blood sugar reading   NEOMYCIN-BACITRACIN-POLYMYXIN  [phone] OINT    Apply topically as needed.   OFLOXACIN (FLOXIN) 0.3 % OTIC SOLUTION    Place 1 drop into the right ear as needed.   PANTOPRAZOLE (PROTONIX) 40 MG TABLET    Take 1 tablet (40 mg total) by mouth daily.   REPAGLINIDE (PRANDIN) 2 MG TABLET    Take 1 tablet (2 mg total) by mouth 3 (three) times daily before meals.   ROSUVASTATIN (CRESTOR) 40 MG TABLET    TAKE 1 TABLET BY MOUTH EVERY DAY  Modified Medications   No medications on file  Discontinued Medications   AMOXICILLIN (AMOXIL) 500 MG CAPSULE    Take 1 capsule (500 mg total) by mouth 3 (three) times daily.    Physical Exam:  Vitals:   08/28/22 1010 08/28/22 1013  BP: (!) 142/62 (!) 146/70  Pulse: 77   Temp: (!) 97.5 F (36.4 C)   TempSrc: Temporal   SpO2: 97%   Weight: 136 lb (61.7 kg)   Height: 5\' 2"  (1.575 m)    Body mass index is 24.87 kg/m. Wt Readings from Last 3 Encounters:  08/28/22 136 lb (61.7 kg)  09/20/21 139 lb (63 kg)  09/19/21 139 lb 3.2 oz (63.1 kg)    Physical Exam Vitals and nursing note reviewed.  Constitutional:      Appearance: Normal appearance.  Cardiovascular:     Rate and Rhythm: Normal rate and regular rhythm.     Heart sounds: Murmur heard.  Pulmonary:     Effort: Pulmonary effort is normal.     Breath sounds: Normal breath sounds.  Skin:    General: Skin is warm and dry.  Neurological:     General: No focal deficit present.     Mental Status: He is alert and oriented to person, place, and time.     Labs reviewed: Basic Metabolic Panel: Recent Labs    09/19/21 1027 08/23/22 0827  NA 138 138  K 4.2 4.4  CL 104 106  CO2 25 23  GLUCOSE 130* 204*  BUN 21 35*  CREATININE 1.32 2.29*  CALCIUM 9.7 9.8   Liver Function Tests: Recent Labs    08/23/22 0827  AST 35  ALT 63*  BILITOT 0.8  PROT 8.4*   No results for input(s): "LIPASE", "AMYLASE" in the last 8760 hours. No results for input(s): "AMMONIA" in the last 8760 hours. CBC: Recent Labs     09/19/21 1027 08/23/22 0827  WBC 8.0 7.7  NEUTROABS 5,480 4,820  HGB 17.1 16.9  HCT 52.6* 52.5*  MCV 87.7 88.2  PLT 164 191   Lipid Panel: Recent Labs    09/19/21 1027 08/23/22 0827  CHOL 146  140  HDL 54 43  LDLCALC 78 75  TRIG 68 136  CHOLHDL 2.7 3.3   TSH: No results for input(s): "TSH" in the last 8760 hours. A1C: Lab Results  Component Value Date   HGBA1C 8.4 (H) 08/23/2022     Assessment/Plan 1. Type 2 diabetes mellitus with microalbuminuria, without long-term current use of insulin (HCC) Will try to get him back on Jardiance with Prandin watch diet more closely plan to repeat Pete A1c in 3 months  2. Hyperlipidemia, unspecified hyperlipidemia type Labs from last week shows LDL of 75 on rosuvastatin 40 mg  3. Primary hypertension Meds include amlodipine 5 mg.  Pressure today 142/62    Jacalyn Lefevre, MD [facility] [phone]

## 2022-09-04 ENCOUNTER — Other Ambulatory Visit: Payer: Self-pay

## 2022-09-06 ENCOUNTER — Other Ambulatory Visit: Payer: Self-pay

## 2022-09-06 DIAGNOSIS — R1013 Epigastric pain: Secondary | ICD-10-CM

## 2022-09-06 DIAGNOSIS — I10 Essential (primary) hypertension: Secondary | ICD-10-CM

## 2022-09-06 DIAGNOSIS — E785 Hyperlipidemia, unspecified: Secondary | ICD-10-CM

## 2022-09-06 DIAGNOSIS — E1129 Type 2 diabetes mellitus with other diabetic kidney complication: Secondary | ICD-10-CM

## 2022-09-06 MED ORDER — REPAGLINIDE 2 MG PO TABS: 2.0000 mg | ORAL_TABLET | Freq: Three times a day (TID) | ORAL | 1 refills | Status: DC

## 2022-09-06 MED ORDER — AMLODIPINE BESYLATE 5 MG PO TABS: 5.0000 mg | ORAL_TABLET | Freq: Every day | ORAL | 1 refills | Status: DC

## 2022-09-06 MED ORDER — EMPAGLIFLOZIN 25 MG PO TABS: 25.0000 mg | ORAL_TABLET | Freq: Every day | ORAL | 1 refills | Status: DC

## 2022-09-06 MED ORDER — LATANOPROST 0.005 % OP SOLN: 1.0000 [drp] | Freq: Every day | OPHTHALMIC | 3 refills | Status: DC

## 2022-09-06 MED ORDER — PANTOPRAZOLE SODIUM 40 MG PO TBEC: 40.0000 mg | DELAYED_RELEASE_TABLET | Freq: Every day | ORAL | 3 refills | Status: DC

## 2022-09-06 MED ORDER — ROSUVASTATIN CALCIUM 10 MG PO TABS: 10.0000 mg | ORAL_TABLET | Freq: Every day | ORAL | 3 refills | Status: DC

## 2022-09-06 NOTE — Telephone Encounter (Signed)
Patient called requesting refills on all his medications prescribed by Dr. Hyacinth Meeker. Patient seen 08/28/22  High dose warning came up when trying to refill medications. Medications pended and sent to Dr. Jacquenette Shone

## 2022-09-08 ENCOUNTER — Other Ambulatory Visit: Payer: Self-pay | Admitting: Sports Medicine

## 2022-09-08 DIAGNOSIS — R1013 Epigastric pain: Secondary | ICD-10-CM

## 2022-10-25 DIAGNOSIS — E119 Type 2 diabetes mellitus without complications: Secondary | ICD-10-CM | POA: Diagnosis not present

## 2022-10-25 DIAGNOSIS — H2513 Age-related nuclear cataract, bilateral: Secondary | ICD-10-CM | POA: Diagnosis not present

## 2022-10-25 DIAGNOSIS — H401131 Primary open-angle glaucoma, bilateral, mild stage: Secondary | ICD-10-CM | POA: Diagnosis not present

## 2022-10-25 DIAGNOSIS — H31093 Other chorioretinal scars, bilateral: Secondary | ICD-10-CM | POA: Diagnosis not present

## 2022-10-28 LAB — HM DIABETES EYE EXAM

## 2022-11-22 ENCOUNTER — Other Ambulatory Visit: Payer: Medicare Other

## 2022-11-22 DIAGNOSIS — R809 Proteinuria, unspecified: Secondary | ICD-10-CM | POA: Diagnosis not present

## 2022-11-22 DIAGNOSIS — E1129 Type 2 diabetes mellitus with other diabetic kidney complication: Secondary | ICD-10-CM | POA: Diagnosis not present

## 2022-11-22 NOTE — Addendum Note (Signed)
Addended by: Meda Klinefelter E on: 11/22/2022 09:02 AM   Modules accepted: Orders

## 2022-11-23 LAB — HEMOGLOBIN A1C
Hgb A1c MFr Bld: 8.1 %{Hb} — ABNORMAL HIGH (ref <5.7)
Mean Plasma Glucose: 186 mg/dL
eAG (mmol/L): 10.3 mmol/L

## 2022-11-27 ENCOUNTER — Ambulatory Visit (INDEPENDENT_AMBULATORY_CARE_PROVIDER_SITE_OTHER): Payer: Medicare Other | Admitting: Sports Medicine

## 2022-11-27 ENCOUNTER — Encounter: Payer: Self-pay | Admitting: Sports Medicine

## 2022-11-27 VITALS — BP 134/68 | HR 69 | Temp 97.7°F | Resp 16 | Ht 62.0 in | Wt 138.6 lb

## 2022-11-27 DIAGNOSIS — R809 Proteinuria, unspecified: Secondary | ICD-10-CM

## 2022-11-27 DIAGNOSIS — N1832 Chronic kidney disease, stage 3b: Secondary | ICD-10-CM

## 2022-11-27 DIAGNOSIS — R413 Other amnesia: Secondary | ICD-10-CM

## 2022-11-27 DIAGNOSIS — E785 Hyperlipidemia, unspecified: Secondary | ICD-10-CM | POA: Diagnosis not present

## 2022-11-27 DIAGNOSIS — I10 Essential (primary) hypertension: Secondary | ICD-10-CM

## 2022-11-27 DIAGNOSIS — E1129 Type 2 diabetes mellitus with other diabetic kidney complication: Secondary | ICD-10-CM | POA: Diagnosis not present

## 2022-11-27 DIAGNOSIS — K219 Gastro-esophageal reflux disease without esophagitis: Secondary | ICD-10-CM | POA: Diagnosis not present

## 2022-11-27 MED ORDER — PIOGLITAZONE HCL 30 MG PO TABS: 30.0000 mg | ORAL_TABLET | Freq: Every day | ORAL | 1 refills | Status: DC

## 2022-11-27 NOTE — Progress Notes (Signed)
Careteam: Patient Care Team: Venita Sheffield, MD as PCP - General (Internal Medicine) Chucky May, M.D., [facility] as Referring Physician (General Practice)  PLACE OF SERVICE:  [facility]  Advanced Directive information Does Patient Have a Medical Advance Directive?: Yes, Type of Advance Directive: Living will, Does patient want to make changes to medical advance directive?: No - Patient declined  Allergies  Allergen Reactions   Losartan     Other reaction(s): Cough   Septra [Bactrim] Nausea And Vomiting   Trulicity [Dulaglutide]     diarrhea   Metformin Diarrhea   Sulfa Antibiotics Rash and Nausea And Vomiting    Chief Complaint  Patient presents with   Medical Management of Chronic Issues    3 month follow up.   Health Maintenance    Discuss the need for AWV, Colonoscopy, and Urine microalbumin.    Immunizations    Discuss the need for Pne vaccine, and DTAP vaccine.      HPI: Patient is a 67 y.o. male is here for follow up  DM Lab Results  Component Value Date   HGBA1C 8.1 (H) 11/22/2022   HGBA1C 8.4 (H) 08/23/2022   HGBA1C 7.5 (H) 09/19/2021   Does not check BG daily  Runs around 130's  On jardiance, prandin  Reports eating sweets  Does not exercise regularly  Reports hypoglycemia    CKD  Lab Results  Component Value Date   CREATININE 2.29 (H) 08/23/2022   CREATININE 1.32 09/19/2021   CREATININE 1.37 (H) 02/14/2021  Reports taking IBU  some times   HTN On amlodipine 5 mg  Denies feeling dizzy or lightheaded  HLD  On crestor  Denies muscle cramps   GERD  Denies heart burn , acid reflux   Insomnia Reports problem going to sleep and maintaining  sleep  Takes day time naps Drinks about 2 cups of coffee    Review of Systems:  Review of Systems  Constitutional:  Negative for chills and fever.  HENT:  Negative for congestion and sore throat.   Eyes:  Negative for double vision.  Respiratory:  Negative for cough,  sputum production and shortness of breath.   Cardiovascular:  Negative for chest pain, palpitations and leg swelling.  Gastrointestinal:  Negative for abdominal pain, heartburn and nausea.  Genitourinary:  Negative for dysuria, frequency and hematuria.  Musculoskeletal:  Negative for falls and myalgias.  Neurological:  Negative for dizziness, sensory change and focal weakness.  Psychiatric/Behavioral:  Positive for memory loss.     Past Medical History:  Diagnosis Date   Allergy    Arthritis    hands, back    Cataract    removed right eye    COVID-19    casirivimab\imdevimab infusions    Diabetes mellitus without complication (HCC)    History of colonoscopy    Hyperlipidemia    Hypertension    Past Surgical History:  Procedure Laterality Date   CARPAL TUNNEL RELEASE Bilateral    CATARACT EXTRACTION Right    CHOLECYSTECTOMY  1990   COLONOSCOPY     CYST REMOVAL NECK     POLYPECTOMY     Social History:   reports that he quit smoking about 41 years ago. His smoking use included cigarettes. He has never used smokeless tobacco. He reports that he does not drink alcohol and does not use drugs.  Family History  Problem Relation Age of Onset   Diabetes Mother 2   Hypertension Mother    Diabetes  Father 9   Hypertension Father    Colon cancer Neg Hx    Colon polyps Neg Hx    Esophageal cancer Neg Hx    Rectal cancer Neg Hx    Stomach cancer Neg Hx     Medications: Patient's Medications  New Prescriptions   No medications on file  Previous Medications   AMLODIPINE (NORVASC) 5 MG TABLET    Take 1 tablet (5 mg total) by mouth daily.   DIPHENHYDRAMINE (BENADRYL) 2 % CREAM    Apply topically 3 (three) times daily as needed for itching.   EMPAGLIFLOZIN (JARDIANCE) 25 MG TABS TABLET    Take 1 tablet (25 mg total) by mouth daily.   KETOCONAZOLE (NIZORAL) 2 % CREAM    Apply 1 Application topically as needed for irritation.   LATANOPROST (XALATAN) 0.005 % OPHTHALMIC SOLUTION     Place 1 drop into both eyes at bedtime.   MECLIZINE (ANTIVERT) 12.5 MG TABLET    Take 1 tablet (12.5 mg total) by mouth 3 (three) times daily as needed for dizziness.   NEOMYCIN-BACITRACIN-POLYMYXIN [phone] OINT    Apply topically as needed.   OFLOXACIN (FLOXIN) 0.3 % OTIC SOLUTION    Place 1 drop into the right ear as needed.   PANTOPRAZOLE (PROTONIX) 40 MG TABLET    Take 40 mg by mouth as needed.   REPAGLINIDE (PRANDIN) 2 MG TABLET    Take 1 tablet (2 mg total) by mouth 3 (three) times daily before meals.   ROSUVASTATIN (CRESTOR) 10 MG TABLET    Take 1 tablet (10 mg total) by mouth daily.  Modified Medications   No medications on file  Discontinued Medications   KETOCONAZOLE (NIZORAL) 2 % CREAM    Apply 1 Application topically daily.   METFORMIN (GLUCOPHAGE) 500 MG TABLET    Take 500 mg by mouth. 1-2 times daily depending on blood sugar reading   PANTOPRAZOLE (PROTONIX) 40 MG TABLET    TAKE 1 TABLET BY MOUTH EVERY DAY    Physical Exam:  Vitals:   11/27/22 0823  BP: 134/68  Pulse: 69  Resp: 16  Temp: 97.7 F (36.5 C)  SpO2: 92%  Weight: 138 lb 9.6 oz (62.9 kg)  Height: 5\' 2"  (1.575 m)   Body mass index is 25.35 kg/m. Wt Readings from Last 3 Encounters:  11/27/22 138 lb 9.6 oz (62.9 kg)  08/28/22 136 lb (61.7 kg)  09/20/21 139 lb (63 kg)    Physical Exam Constitutional:      Appearance: Normal appearance.  HENT:     Head: Normocephalic and atraumatic.  Cardiovascular:     Rate and Rhythm: Normal rate and regular rhythm.     Pulses: Normal pulses.     Heart sounds: Normal heart sounds.  Pulmonary:     Effort: No respiratory distress.     Breath sounds: No stridor. No wheezing or rales.  Abdominal:     General: Bowel sounds are normal. There is no distension.     Palpations: Abdomen is soft.     Tenderness: There is no abdominal tenderness. There is no right CVA tenderness or guarding.  Musculoskeletal:        General: No swelling.  Neurological:     Mental  Status: He is alert. Mental status is at baseline.     Sensory: No sensory deficit.     Motor: No weakness.     Labs reviewed: Basic Metabolic Panel: Recent Labs    08/23/22 0827  NA 138  K 4.4  CL 106  CO2 23  GLUCOSE 204*  BUN 35*  CREATININE 2.29*  CALCIUM 9.8   Liver Function Tests: Recent Labs    08/23/22 0827  AST 35  ALT 63*  BILITOT 0.8  PROT 8.4*   No results for input(s): "LIPASE", "AMYLASE" in the last 8760 hours. No results for input(s): "AMMONIA" in the last 8760 hours. CBC: Recent Labs    08/23/22 0827  WBC 7.7  NEUTROABS 4,820  HGB 16.9  HCT 52.5*  MCV 88.2  PLT 191   Lipid Panel: Recent Labs    08/23/22 0827  CHOL 140  HDL 43  LDLCALC 75  TRIG 136  CHOLHDL 3.3   TSH: No results for input(s): "TSH" in the last 8760 hours. A1C: Lab Results  Component Value Date   HGBA1C 8.1 (H) 11/22/2022     Assessment/Plan  1. Type 2 diabetes mellitus with microalbuminuria, without long-term current use of insulin (HCC) Will stop prandin due to hypoglycemia Will start actos  Cont with jardiance Will refer to nutrition  Instructed patient to avoid high carbohydrate foods Exercise regularly  Follow up in 4 weeks  - pioglitazone (ACTOS) 30 MG tablet; Take 1 tablet (30 mg total) by mouth daily.  Dispense: 90 tablet; Refill: 1 - Ambulatory referral to diabetic education  2. Primary hypertension At goal  Cont with amlodipine  3. Hyperlipidemia, unspecified hyperlipidemia type Lipid Panel     Component Value Date/Time   CHOL 140 08/23/2022 0827   CHOL 170 02/04/2020 1637   TRIG 136 08/23/2022 0827   HDL 43 08/23/2022 0827   HDL 44 02/04/2020 1637   CHOLHDL 3.3 08/23/2022 0827   VLDL 18 11/06/2015 0951   LDLCALC 75 08/23/2022 0827   LABVLDL 32 02/04/2020 1637   Cont with crestor   4. Gastroesophageal reflux disease, unspecified whether esophagitis present Avoid eating spicy foods Cont with protonix  5. Stage 3b chronic kidney  disease (HCC) Avoid taking nephrotoxic meds - Basic Metabolic Panel with eGFR - Microalbumin/Creatinine Ratio, Urine - Vitamin B12  6. Memory changes Reports misplacing things  Will check labs Follow up in 4 weeks  Will do MMSE at next visit  - Basic Metabolic Panel with eGFR - PSA - Vitamin B12 - TSH  Other orders - ketoconazole (NIZORAL) 2 % cream; Apply 1 Application topically as needed for irritation. - pantoprazole (PROTONIX) 40 MG tablet; Take 40 mg by mouth as needed.   No follow-ups on file.:

## 2022-11-28 LAB — MICROALBUMIN / CREATININE URINE RATIO
Creatinine, Urine: 63 mg/dL (ref 20–320)
Microalb Creat Ratio: 54 mg/g{creat} — ABNORMAL HIGH (ref <30)
Microalb, Ur: 3.4 mg/dL

## 2022-11-28 LAB — BASIC METABOLIC PANEL WITH GFR
BUN: 25 mg/dL (ref 7–25)
CO2: 26 mmol/L (ref 20–32)
Calcium: 9.9 mg/dL (ref 8.6–10.3)
Chloride: 105 mmol/L (ref 98–110)
Creat: 1.19 mg/dL (ref 0.70–1.35)
Glucose, Bld: 144 mg/dL — ABNORMAL HIGH (ref 65–139)
Potassium: 4.7 mmol/L (ref 3.5–5.3)
Sodium: 141 mmol/L (ref 135–146)
eGFR: 66 mL/min/{1.73_m2} (ref >=60)

## 2022-11-28 LAB — TSH: TSH: 1.71 m[IU]/L (ref 0.40–4.50)

## 2022-11-28 LAB — VITAMIN B12: Vitamin B-12: 812 pg/mL (ref 200–1100)

## 2022-11-29 ENCOUNTER — Telehealth: Payer: Self-pay | Admitting: Sports Medicine

## 2022-12-02 NOTE — Telephone Encounter (Signed)
Left message on voicemail for patient to return call when available

## 2022-12-02 NOTE — Telephone Encounter (Signed)
Plz call the patient and inform that urine test showed protein in the urine, he needs to take losartan or lisinopril,  it looks like allergic to losartan, can you check more about his allergic reaction to losartan. If its cough that  mostly happens with lisinopril and losartan does not cause cough, is he willing to try again, thanks.

## 2022-12-25 ENCOUNTER — Encounter (INDEPENDENT_AMBULATORY_CARE_PROVIDER_SITE_OTHER): Payer: Medicare Other | Admitting: Sports Medicine

## 2022-12-26 NOTE — Progress Notes (Signed)
This encounter was created in error - please disregard.

## 2023-01-07 ENCOUNTER — Ambulatory Visit (INDEPENDENT_AMBULATORY_CARE_PROVIDER_SITE_OTHER): Payer: Medicare Other | Admitting: Sports Medicine

## 2023-01-07 ENCOUNTER — Encounter: Payer: Self-pay | Admitting: Sports Medicine

## 2023-01-07 VITALS — BP 126/60 | HR 65 | Temp 97.3°F | Resp 16 | Ht 62.0 in | Wt 138.8 lb

## 2023-01-07 DIAGNOSIS — I10 Essential (primary) hypertension: Secondary | ICD-10-CM

## 2023-01-07 DIAGNOSIS — E782 Mixed hyperlipidemia: Secondary | ICD-10-CM

## 2023-01-07 DIAGNOSIS — R809 Proteinuria, unspecified: Secondary | ICD-10-CM | POA: Diagnosis not present

## 2023-01-07 DIAGNOSIS — N182 Chronic kidney disease, stage 2 (mild): Secondary | ICD-10-CM

## 2023-01-07 DIAGNOSIS — E1129 Type 2 diabetes mellitus with other diabetic kidney complication: Secondary | ICD-10-CM

## 2023-01-07 NOTE — Progress Notes (Addendum)
 Careteam: Patient Care Team: Sherlynn Madden, MD as PCP - General (Internal Medicine) Nancyann DOROTHA Ebbing, M.D., [facility] as Referring Physician (General Practice)  PLACE OF SERVICE:  [facility]  Advanced Directive information    Allergies  Allergen Reactions   Losartan      Other reaction(s): Cough   Septra [Bactrim] Nausea And Vomiting   Trulicity  [Dulaglutide ]     diarrhea   Metformin  Diarrhea   Sulfa Antibiotics Rash and Nausea And Vomiting       Discussed the use of AI scribe software for clinical note transcription with the patient, who gave verbal consent to proceed.        HPI: Patient is a 67 y.o. male  is here for follow up on his DM  Dm Lab Results  Component Value Date   HGBA1C 8.1 (H) 11/22/2022   HGBA1C 8.4 (H) 08/23/2022   HGBA1C 7.5 (H) 09/19/2021    He monitors his blood glucose every other day, with readings ranging from 126 to 135. He exercises approximately three times a week and is making efforts to maintain a diet low in carbohydrates and sugars.  The patient also has a history of proteinuria, which has been present for over ten years. He reports an allergy to losartan , which manifested as a rash and lip swelling. Despite this, his kidney function has shown significant improvement over the past four months, as evidenced by a decrease in creatinine levels.  HTN  BP 126/60 On amlodipine   CKD Lab Results  Component Value Date   CREATININE 1.19 11/27/2022   CREATININE 2.29 (H) 08/23/2022   CREATININE 1.32 09/19/2021    GERD  Doing better    He denies any issues with breathing or chest pain during exercise. However, he reports frequent urination, particularly at night, but denies any pain, straining, or dribbling during urination. He has not experienced any falls within the past year and denies any feelings of depression. He reports occasional leg cramping but denies any significant heartburn or acid reflux.  Review of  Systems:  Review of Systems  Constitutional:  Negative for chills and fever.  HENT:  Negative for congestion and sore throat.   Eyes:  Negative for double vision.  Respiratory:  Negative for cough, sputum production and shortness of breath.   Cardiovascular:  Negative for chest pain, palpitations and leg swelling.  Gastrointestinal:  Negative for abdominal pain, heartburn and nausea.  Genitourinary:  Negative for dysuria, frequency and hematuria.       Nocturia  Musculoskeletal:  Negative for falls and myalgias.  Neurological:  Negative for dizziness, sensory change and focal weakness.   Negative unless indicated in HPI.   Past Medical History:  Diagnosis Date   Allergy    Arthritis    hands, back    Cataract    removed right eye    COVID-19    casirivimab \imdevimab  infusions    Diabetes mellitus without complication (HCC)    History of colonoscopy    Hyperlipidemia    Hypertension    Past Surgical History:  Procedure Laterality Date   CARPAL TUNNEL RELEASE Bilateral    CATARACT EXTRACTION Right    CHOLECYSTECTOMY  1990   COLONOSCOPY     CYST REMOVAL NECK     POLYPECTOMY     Social History:   reports that he quit smoking about 41 years ago. His smoking use included cigarettes. He has never used smokeless tobacco. He reports that he does not drink alcohol and does  not use drugs.  Family History  Problem Relation Age of Onset   Diabetes Mother 42   Hypertension Mother    Diabetes Father 71   Hypertension Father    Colon cancer Neg Hx    Colon polyps Neg Hx    Esophageal cancer Neg Hx    Rectal cancer Neg Hx    Stomach cancer Neg Hx     Medications: Patient's Medications  New Prescriptions   No medications on file  Previous Medications   AMLODIPINE  (NORVASC ) 5 MG TABLET    Take 1 tablet (5 mg total) by mouth daily.   DIPHENHYDRAMINE  (BENADRYL ) 2 % CREAM    Apply topically 3 (three) times daily as needed for itching.   EMPAGLIFLOZIN  (JARDIANCE ) 25 MG TABS  TABLET    Take 1 tablet (25 mg total) by mouth daily.   KETOCONAZOLE  (NIZORAL ) 2 % CREAM    Apply 1 Application topically as needed for irritation.   LATANOPROST  (XALATAN ) 0.005 % OPHTHALMIC SOLUTION    Place 1 drop into both eyes at bedtime.   MECLIZINE  (ANTIVERT ) 12.5 MG TABLET    Take 1 tablet (12.5 mg total) by mouth 3 (three) times daily as needed for dizziness.   NEOMYCIN-BACITRACIN-POLYMYXIN [phone] OINT    Apply topically as needed.   OFLOXACIN  (FLOXIN ) 0.3 % OTIC SOLUTION    Place 1 drop into the right ear as needed.   PANTOPRAZOLE  (PROTONIX ) 40 MG TABLET    Take 40 mg by mouth as needed.   PIOGLITAZONE  (ACTOS ) 30 MG TABLET    Take 1 tablet (30 mg total) by mouth daily.   ROSUVASTATIN  (CRESTOR ) 10 MG TABLET    Take 1 tablet (10 mg total) by mouth daily.  Modified Medications   No medications on file  Discontinued Medications   No medications on file    Physical Exam: There were no vitals filed for this visit. There is no height or weight on file to calculate BMI. BP Readings from Last 3 Encounters:  11/27/22 134/68  08/28/22 (!) 146/70  09/20/21 136/68   Wt Readings from Last 3 Encounters:  11/27/22 138 lb 9.6 oz (62.9 kg)  08/28/22 136 lb (61.7 kg)  09/20/21 139 lb (63 kg)    Physical Exam Constitutional:      Appearance: Normal appearance.  HENT:     Head: Normocephalic and atraumatic.  Cardiovascular:     Rate and Rhythm: Normal rate and regular rhythm.     Pulses: Normal pulses.     Heart sounds: Normal heart sounds.  Pulmonary:     Effort: No respiratory distress.     Breath sounds: No stridor. No wheezing or rales.  Abdominal:     General: Bowel sounds are normal. There is no distension.     Palpations: Abdomen is soft.     Tenderness: There is no abdominal tenderness. There is no right CVA tenderness or guarding.  Musculoskeletal:        General: No swelling.  Neurological:     Mental Status: He is alert. Mental status is at baseline.     Sensory:  No sensory deficit.     Motor: No weakness.     Labs reviewed: Basic Metabolic Panel: Recent Labs    08/23/22 0827 11/27/22 0910  NA 138 141  K 4.4 4.7  CL 106 105  CO2 23 26  GLUCOSE 204* 144*  BUN 35* 25  CREATININE 2.29* 1.19  CALCIUM  9.8 9.9  TSH  --  1.71  Liver Function Tests: Recent Labs    08/23/22 0827  AST 35  ALT 63*  BILITOT 0.8  PROT 8.4*   No results for input(s): LIPASE, AMYLASE in the last 8760 hours. No results for input(s): AMMONIA in the last 8760 hours. CBC: Recent Labs    08/23/22 0827  WBC 7.7  NEUTROABS 4,820  HGB 16.9  HCT 52.5*  MCV 88.2  PLT 191   Lipid Panel: Recent Labs    08/23/22 0827  CHOL 140  HDL 43  LDLCALC 75  TRIG 136  CHOLHDL 3.3   TSH: Recent Labs    11/27/22 0910  TSH 1.71   A1C: Lab Results  Component Value Date   HGBA1C 8.1 (H) 11/22/2022     Assessment/Plan  Type 2 Diabetes Mellitus Blood sugars fluctuating with readings between 126-135 mg/dL. Patient checks blood sugars every other day before meals. Currently on Actos , Jardiance  -Continue current medications. -Encourage daily exercise and dietary modifications (avoid sweets, high carbohydrate foods, and white rice;  . -Check blood sugars daily and record readings for review at next visit.  Hypertension Blood pressure controlled at 126/60. Currently on Amlodipine . -Continue Amlodipine . -Advise to limit salt intake.  Hyperlipidemia Currently on Crestor . Reports occasional leg cramps. -Continue Crestor .  Chronic Kidney Disease Improved kidney function noted on recent labs (creatinine 1.19). History of proteinuria. Currently on Jardiance . -Continue Jardiance . -Encourage increased water intake.  Nocturia Reports frequent urination at night. No other urinary symptoms. -Advise to limit fluid intake in the evening.  Follow-up in 3 months with recorded blood sugar readings.

## 2023-01-15 ENCOUNTER — Encounter: Payer: Self-pay | Admitting: Family

## 2023-01-15 ENCOUNTER — Ambulatory Visit: Payer: Medicare Other | Admitting: Family

## 2023-01-15 VITALS — BP 130/64 | HR 72 | Temp 97.4°F | Resp 20 | Ht 62.0 in | Wt 138.6 lb

## 2023-01-15 DIAGNOSIS — Z87891 Personal history of nicotine dependence: Secondary | ICD-10-CM

## 2023-01-15 DIAGNOSIS — Z0000 Encounter for general adult medical examination without abnormal findings: Secondary | ICD-10-CM

## 2023-01-15 NOTE — Patient Instructions (Signed)
 Mr. Jesse Baldwin , Thank you for taking time to come for your Medicare Wellness Visit. I appreciate your ongoing commitment to your health goals. Please review the following plan we discussed and let me know if I can assist you in the future.   Screening recommendations/referrals: Colonoscopy : up to date  Recommended yearly ophthalmology/optometry visit for glaucoma screening and checkup Recommended yearly dental visit for hygiene and checkup  Vaccinations: Influenza vaccine due annually in September/October Pneumococcal vaccine : N/A had a reaction to PNA vaccine  Tdap vaccine : Please get vaccine at the  Shingles vaccine : up to date     Advanced directives: yes  Conditions/risks identified:  advanced age (>56men, >42 women);diabetes mellitus;hypertension;male gender;smoking/ tobacco exposure  Next appointment: 1 year   Preventive Care 40 Years and Older, Male Preventive care refers to lifestyle choices and visits with your health care provider that can promote health and wellness. What does preventive care include? A yearly physical exam. This is also called an annual well check. Dental exams once or twice a year. Routine eye exams. Ask your health care provider how often you should have your eyes checked. Personal lifestyle choices, including: Daily care of your teeth and gums. Regular physical activity. Eating a healthy diet. Avoiding tobacco and drug use. Limiting alcohol use. Practicing safe sex. Taking low doses of aspirin every day. Taking vitamin and mineral supplements as recommended by your health care provider. What happens during an annual well check? The services and screenings done by your health care provider during your annual well check will depend on your age, overall health, lifestyle risk factors, and family history of disease. Counseling  Your health care provider may ask you questions about your: Alcohol use. Tobacco use. Drug use. Emotional  well-being. Home and relationship well-being. Sexual activity. Eating habits. History of falls. Memory and ability to understand (cognition). Work and work astronomer. Screening  You may have the following tests or measurements: Height, weight, and BMI. Blood pressure. Lipid and cholesterol levels. These may be checked every 5 years, or more frequently if you are over 68 years old. Skin check. Lung cancer screening. You may have this screening every year starting at age 22 if you have a 30-pack-year history of smoking and currently smoke or have quit within the past 15 years. Fecal occult blood test (FOBT) of the stool. You may have this test every year starting at age 12. Flexible sigmoidoscopy or colonoscopy. You may have a sigmoidoscopy every 5 years or a colonoscopy every 10 years starting at age 72. Prostate cancer screening. Recommendations will vary depending on your family history and other risks. Hepatitis C blood test. Hepatitis B blood test. Sexually transmitted disease (STD) testing. Diabetes screening. This is done by checking your blood sugar (glucose) after you have not eaten for a while (fasting). You may have this done every 1-3 years. Abdominal aortic aneurysm (AAA) screening. You may need this if you are a current or former smoker. Osteoporosis. You may be screened starting at age 67 if you are at high risk. Talk with your health care provider about your test results, treatment options, and if necessary, the need for more tests. Vaccines  Your health care provider may recommend certain vaccines, such as: Influenza vaccine. This is recommended every year. Tetanus, diphtheria, and acellular pertussis (Tdap, Td) vaccine. You may need a Td booster every 10 years. Zoster vaccine. You may need this after age 31. Pneumococcal 13-valent conjugate (PCV13) vaccine. One dose is recommended after age  65. Pneumococcal polysaccharide (PPSV23) vaccine. One dose is recommended after  age 31. Talk to your health care provider about which screenings and vaccines you need and how often you need them. This information is not intended to replace advice given to you by your health care provider. Make sure you discuss any questions you have with your health care provider. Document Released: 01/20/2015 Document Revised: 09/13/2015 Document Reviewed: 10/25/2014 Elsevier Interactive Patient Education  2017 [employer].  Fall Prevention in the Home Falls can cause injuries. They can happen to people of all ages. There are many things you can do to make your home safe and to help prevent falls. What can I do on the outside of my home? Regularly fix the edges of walkways and driveways and fix any cracks. Remove anything that might make you trip as you walk through a door, such as a raised step or threshold. Trim any bushes or trees on the path to your home. Use bright outdoor lighting. Clear any walking paths of anything that might make someone trip, such as rocks or tools. Regularly check to see if handrails are loose or broken. Make sure that both sides of any steps have handrails. Any raised decks and porches should have guardrails on the edges. Have any leaves, snow, or ice cleared regularly. Use sand or salt on walking paths during winter. Clean up any spills in your garage right away. This includes oil or grease spills. What can I do in the bathroom? Use night lights. Install grab bars by the toilet and in the tub and shower. Do not use towel bars as grab bars. Use non-skid mats or decals in the tub or shower. If you need to sit down in the shower, use a plastic, non-slip stool. Keep the floor dry. Clean up any water that spills on the floor as soon as it happens. Remove soap buildup in the tub or shower regularly. Attach bath mats securely with double-sided non-slip rug tape. Do not have throw rugs and other things on the floor that can make you trip. What can I do in the  bedroom? Use night lights. Make sure that you have a light by your bed that is easy to reach. Do not use any sheets or blankets that are too big for your bed. They should not hang down onto the floor. Have a firm chair that has side arms. You can use this for support while you get dressed. Do not have throw rugs and other things on the floor that can make you trip. What can I do in the kitchen? Clean up any spills right away. Avoid walking on wet floors. Keep items that you use a lot in easy-to-reach places. If you need to reach something above you, use a strong step stool that has a grab bar. Keep electrical cords out of the way. Do not use floor polish or wax that makes floors slippery. If you must use wax, use non-skid floor wax. Do not have throw rugs and other things on the floor that can make you trip. What can I do with my stairs? Do not leave any items on the stairs. Make sure that there are handrails on both sides of the stairs and use them. Fix handrails that are broken or loose. Make sure that handrails are as long as the stairways. Check any carpeting to make sure that it is firmly attached to the stairs. Fix any carpet that is loose or worn. Avoid having throw rugs at  the top or bottom of the stairs. If you do have throw rugs, attach them to the floor with carpet tape. Make sure that you have a light switch at the top of the stairs and the bottom of the stairs. If you do not have them, ask someone to add them for you. What else can I do to help prevent falls? Wear shoes that: Do not have high heels. Have rubber bottoms. Are comfortable and fit you well. Are closed at the toe. Do not wear sandals. If you use a stepladder: Make sure that it is fully opened. Do not climb a closed stepladder. Make sure that both sides of the stepladder are locked into place. Ask someone to hold it for you, if possible. Clearly mark and make sure that you can see: Any grab bars or  handrails. First and last steps. Where the edge of each step is. Use tools that help you move around (mobility aids) if they are needed. These include: Canes. Walkers. Scooters. Crutches. Turn on the lights when you go into a dark area. Replace any light bulbs as soon as they burn out. Set up your furniture so you have a clear path. Avoid moving your furniture around. If any of your floors are uneven, fix them. If there are any pets around you, be aware of where they are. Review your medicines with your doctor. Some medicines can make you feel dizzy. This can increase your chance of falling. Ask your doctor what other things that you can do to help prevent falls. This information is not intended to replace advice given to you by your health care provider. Make sure you discuss any questions you have with your health care provider. Document Released: 10/20/2008 Document Revised: 06/01/2015 Document Reviewed: 01/28/2014 Elsevier Interactive Patient Education  2017 [employer].

## 2023-01-15 NOTE — Progress Notes (Signed)
 Subjective:   Jesse Baldwin is a 68 y.o. male who presents for Medicare Annual/Subsequent preventive examination.  Visit Complete: In person  Patient Medicare AWV questionnaire was completed by the patient on 01/15/2023; I have confirmed that all information answered by patient is correct and no changes since this date.  Cardiac Risk Factors include: advanced age (>52men, >37 women);diabetes mellitus;hypertension;male gender;smoking/ tobacco exposure     Objective:    Today's Vitals   01/15/23 0947  BP: 130/64  Pulse: 72  Resp: 20  Temp: (!) 97.4 F (36.3 C)  SpO2: 98%  Weight: 138 lb 9.6 oz (62.9 kg)  Height: 5' 2 (1.575 m)   Body mass index is 25.35 kg/m.     01/15/2023    9:47 AM 01/07/2023    8:38 AM 11/27/2022    8:29 AM 08/28/2022   10:05 AM 06/07/2021   10:11 AM 05/03/2020    8:27 AM 02/08/2019    9:11 PM  Advanced Directives  Does Patient Have a Medical Advance Directive? Yes Yes Yes No;Yes No No No  Type of Estate Agent of [place];Living will Living will Living will Healthcare Power of [place];Living will     Does patient want to make changes to medical advance directive? No - Patient declined No - Patient declined No - Patient declined No - Patient declined     Copy of Healthcare Power of Attorney in Chart? No - copy requested   No - copy requested     Would patient like information on creating a medical advance directive?     No - Patient declined No - Patient declined No - Patient declined    Current Medications (verified) Outpatient Encounter Medications as of 01/15/2023  Medication Sig   amLODipine  (NORVASC ) 5 MG tablet Take 1 tablet (5 mg total) by mouth daily.   diphenhydrAMINE  (BENADRYL ) 2 % cream Apply topically 3 (three) times daily as needed for itching.   empagliflozin  (JARDIANCE ) 25 MG TABS tablet Take 1 tablet (25 mg total) by mouth daily.   ketoconazole  (NIZORAL ) 2 % cream Apply 1 Application topically as needed for  irritation.   latanoprost  (XALATAN ) 0.005 % ophthalmic solution Place 1 drop into both eyes at bedtime.   meclizine  (ANTIVERT ) 12.5 MG tablet Take 1 tablet (12.5 mg total) by mouth 3 (three) times daily as needed for dizziness.   neomycin-bacitracin-polymyxin [phone] OINT Apply topically as needed.   ofloxacin  (FLOXIN ) 0.3 % OTIC solution Place 1 drop into the right ear as needed.   pantoprazole  (PROTONIX ) 40 MG tablet Take 40 mg by mouth as needed.   pioglitazone  (ACTOS ) 30 MG tablet Take 1 tablet (30 mg total) by mouth daily.   rosuvastatin  (CRESTOR ) 10 MG tablet Take 1 tablet (10 mg total) by mouth daily.   Facility-Administered Encounter Medications as of 01/15/2023  Medication   albuterol  (VENTOLIN  HFA) 108 (90 Base) MCG/ACT inhaler 2 puff    Allergies (verified) Losartan , Pneumococcal vac polyvalent, Septra [bactrim], Trulicity  [dulaglutide ], Metformin , and Sulfa antibiotics   History: Past Medical History:  Diagnosis Date   Allergy    Arthritis    hands, back    Cataract    removed right eye    COVID-19    casirivimab \imdevimab  infusions    Diabetes mellitus without complication (HCC)    History of colonoscopy    Hyperlipidemia    Hypertension    Past Surgical History:  Procedure Laterality Date   CARPAL TUNNEL RELEASE Bilateral    CATARACT EXTRACTION Right  CHOLECYSTECTOMY  1990   COLONOSCOPY     CYST REMOVAL NECK     POLYPECTOMY     Family History  Problem Relation Age of Onset   Diabetes Mother 27   Hypertension Mother    Diabetes Father 32   Hypertension Father    Colon cancer Neg Hx    Colon polyps Neg Hx    Esophageal cancer Neg Hx    Rectal cancer Neg Hx    Stomach cancer Neg Hx    Social History   Socioeconomic History   Marital status: Married    Spouse name: Not on file   Number of children: Not on file   Years of education: Not on file   Highest education level: Not on file  Occupational History   Not on file  Tobacco Use    Smoking status: Former    Current packs/day: 0.00    Types: Cigarettes    Quit date: 04/07/1981    Years since quitting: 41.8   Smokeless tobacco: Never  Vaping Use   Vaping status: Never Used  Substance and Sexual Activity   Alcohol use: No   Drug use: No   Sexual activity: Yes    Birth control/protection: None  Other Topics Concern   Not on file  Social History Narrative   Marital status: married x 36 years; from [place]; [place]  in 1976      Children: 3 daughters; 2 grandchildren      Lives: with wife; daughters in [place]      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      Alcohol: quit drinking 1998.      Drugs: none      Exercise: 3 days per week; walking.  Light weights.      Diet: Blank      Do you drink/ eat things with caffeine? Blank      Marital status: Married                              What year were you married ? 1981      Do you live in a house, apartment,assistred living, condo, trailer, etc.)? House      Is it one or more stories? One      How many persons live in your home ? 4      Do you have any pets in your home ?(please list) Dog      Highest Level of education completed: 12      Current or past profession: None      Do you exercise? Yes                             Type & how often  3 times a week      ADVANCED DIRECTIVES (Please bring copies)      Do you have a living will? No      Do you have a DNR form?   No                    If not, do you want to discuss one?       Do you have signed POA?HPOA forms?  No               If so, please bring to your  appointment      FUNCTIONAL STATUS- To be completed by Spouse / child / Staff       Do you have difficulty bathing or dressing yourself ?  No      Do you have difficulty preparing food or eating ?  No      Do you have difficulty managing your mediation ?  No      Do you have difficulty managing your finances ?  No      Do you have  difficulty affording your medication ?  No      Social Drivers of Corporate Investment Banker Strain: Not on file  Food Insecurity: Not on file  Transportation Needs: Not on file  Physical Activity: Not on file  Stress: Not on file  Social Connections: Not on file    Tobacco Counseling Counseling given: Not Answered   Clinical Intake:  Pre-visit preparation completed: No  Pain : No/denies pain     BMI - recorded: 25.35 Nutritional Status: BMI 25 -29 Overweight Nutritional Risks: None Diabetes: Yes CBG done?: No Did pt. bring in CBG monitor from home?: No (recalls CBG 70's - 130's)  How often do you need to have someone help you when you read instructions, pamphlets, or other written materials from your doctor or pharmacy?: 1 - Never What is the last grade level you completed in school?: 12 Grade  Interpreter Needed?: No  Information entered by :: Porsha McClurkin,CMA   Activities of Daily Living    01/15/2023   10:15 AM 01/15/2023    9:45 AM  In your present state of health, do you have any difficulty performing the following activities:  Hearing? 0 0  Vision? 0 0  Difficulty concentrating or making decisions? 0 0  Walking or climbing stairs? 0 0  Dressing or bathing? 0 0  Doing errands, shopping? 0 0  Preparing Food and eating ? N N  Using the Toilet? N N  In the past six months, have you accidently leaked urine? N N  Do you have problems with loss of bowel control? N N  Managing your Medications? N N  Managing your Finances? N N  Housekeeping or managing your Housekeeping? N N    Patient Care Team: Sherlynn Madden, MD as PCP - General (Internal Medicine) Nancyann DOROTHA Ebbing, M.D., [facility] as Referring Physician (General Practice)  Indicate any recent Medical Services you may have received from other than [facility] providers in the past year (date may be approximate).     Assessment:   This is a routine wellness examination for  Dwaine.  Hearing/Vision screen Hearing Screening - Comments:: No hearing concerns Vision Screening - Comments:: No vision concerns   Goals Addressed             This Visit's Progress    Exercise 150 min/wk Moderate Activity       Continue to exercise in the Gym four times per week        Depression Screen    01/15/2023    9:47 AM 01/07/2023    8:48 AM 08/28/2022   10:05 AM 03/28/2021   10:33 AM 02/04/2020   10:54 AM 09/16/2019    8:46 AM 06/10/2019    8:36 AM  PHQ 2/9 Scores  PHQ - 2 Score 0 0 0 0 0 0 0    Fall Risk    01/15/2023    9:47 AM 01/07/2023    8:48 AM 01/07/2023  8:38 AM 11/27/2022    8:28 AM 08/28/2022   10:05 AM  Fall Risk   Falls in the past year? 0 0 0 0 0  Number falls in past yr: 0 0 0 0 0  Injury with Fall? 0 0 0 0 0  Risk for fall due to : No Fall Risks  No Fall Risks No Fall Risks Other (Comment)  Follow up Falls evaluation completed  Falls evaluation completed;Education provided;Falls prevention discussed Falls evaluation completed;Education provided;Falls prevention discussed Falls evaluation completed    MEDICARE RISK AT HOME: Medicare Risk at Home Any stairs in or around the home?: Yes If so, are there any without handrails?: Yes Home free of loose throw rugs in walkways, pet beds, electrical cords, etc?: No Adequate lighting in your home to reduce risk of falls?: Yes Life alert?: No Use of a cane, walker or w/c?: No Grab bars in the bathroom?: Yes Shower chair or bench in shower?: No Elevated toilet seat or a handicapped toilet?: No  TIMED UP AND GO:  Was the test performed?  Yes  Length of time to ambulate 10 feet: 5 sec Gait steady and fast without use of assistive device    Cognitive Function:    01/15/2023    9:49 AM  MMSE - Mini Mental State Exam  Orientation to time 5  Orientation to Place 5  Registration 3  Attention/ Calculation 5  Recall 3  Language- name 2 objects 2  Language- repeat 1  Language- follow 3 step  command 3  Language- read & follow direction 1  Write a sentence 1  Copy design 1  Total score 30        Immunizations Immunization History  Administered Date(s) Administered   Fluad Quad(high Dose 65+) 09/16/2019, 11/24/2020   Influenza, High Dose Seasonal PF 10/22/2022   Influenza-Unspecified 10/28/2013, 10/16/2015, 10/07/2016, 10/11/2019   Janssen (J&J) SARS-COV-2 Vaccination 05/14/2019, 05/15/2019   Moderna Covid-19 Fall Seasonal Vaccine 34yrs & older 10/22/2022   Moderna SARS-COV2 Booster Vaccination 05/27/2020   Moderna Sars-Covid-2 Vaccination 11/17/2019   Pneumococcal Conjugate-13 01/08/2020   Pneumococcal Polysaccharide-23 11/06/2015   Td 01/08/2010   Zoster Recombinant(Shingrix) 03/13/2018, 03/22/2018, 07/17/2018    TDAP status: Due, Education has been provided regarding the importance of this vaccine. Advised may receive this vaccine at local pharmacy or Health Dept. Aware to provide a copy of the vaccination record if obtained from local pharmacy or Health Dept. Verbalized acceptance and understanding.  Flu Vaccine status: Up to date  Pneumococcal vaccine status: Declined,  Education has been provided regarding the importance of this vaccine but patient still declined. Advised may receive this vaccine at local pharmacy or Health Dept. Aware to provide a copy of the vaccination record if obtained from local pharmacy or Health Dept. Verbalized acceptance and understanding.   Covid-19 vaccine status: Completed vaccines  Qualifies for Shingles Vaccine? Yes   Zostavax completed Yes   Shingrix Completed?: Yes  Screening Tests Health Maintenance  Topic Date Due   DTaP/Tdap/Td (2 - Tdap) 01/09/2020   Colonoscopy  03/04/2023 (Originally 04/11/2019)   Pneumonia Vaccine 30+ Years old (3 of 3 - PPSV23 or PCV20) 11/23/2030 (Originally 11/05/2020)   HEMOGLOBIN A1C  05/22/2023   FOOT EXAM  08/28/2023   OPHTHALMOLOGY EXAM  10/28/2023   Diabetic kidney evaluation - eGFR  measurement  11/27/2023   Diabetic kidney evaluation - Urine ACR  11/27/2023   Medicare Annual Wellness (AWV)  01/15/2024   INFLUENZA VACCINE  Completed   COVID-19 Vaccine  Completed   Hepatitis C Screening  Completed   Zoster Vaccines- Shingrix  Completed   HPV VACCINES  Aged Out    Health Maintenance  Health Maintenance Due  Topic Date Due   DTaP/Tdap/Td (2 - Tdap) 01/09/2020    Colorectal cancer screening: Type of screening: Colonoscopy. Completed 04/11/2014 . Repeat every 10 years  Lung Cancer Screening: (Low Dose CT Chest recommended if Age 26-80 years, 20 pack-year currently smoking OR have quit w/in 15years.) does qualify.   Lung Cancer Screening Referral: ordered today   Additional Screening:  Hepatitis C Screening: does qualify; Completed yes   Vision Screening: Recommended annual ophthalmology exams for early detection of glaucoma and other disorders of the eye. Is the patient up to date with their annual eye exam?  Yes  Who is the provider or what is the name of the office in which the patient attends annual eye exams? Dr. Rockwell  If pt is not established with a provider, would they like to be referred to a provider to establish care? No .   Dental Screening: Recommended annual dental exams for proper oral hygiene  Diabetic Foot Exam: Diabetic Foot Exam: Completed 08/28/2022  Community Resource Referral / Chronic Care Management: CRR required this visit?  No   CCM required this visit?  No     Plan:     I have personally reviewed and noted the following in the patient's chart:   Medical and social history Use of alcohol, tobacco or illicit drugs  Current medications and supplements including opioid prescriptions. Patient is not currently taking opioid prescriptions. Functional ability and status Nutritional status Physical activity Advanced directives List of other physicians Hospitalizations, surgeries, and ER visits in previous 12  months Vitals Screenings to include cognitive, depression, and falls Referrals and appointments  In addition, I have reviewed and discussed with patient certain preventive protocols, quality metrics, and best practice recommendations. A written personalized care plan for preventive services as well as general preventive health recommendations were provided to patient.     Roxan JAYSON Plough, NP   01/15/2023   After Visit Summary: (In Person-Printed) AVS printed and given to the patient  Nurse Notes: Advised to get Tdap vaccine at the pharmacy

## 2023-01-16 NOTE — Addendum Note (Signed)
 Addended by: Rudi Heap on: 01/16/2023 11:02 AM   Modules accepted: Orders

## 2023-02-06 ENCOUNTER — Other Ambulatory Visit: Payer: Medicare Other

## 2023-02-28 ENCOUNTER — Other Ambulatory Visit: Payer: Self-pay | Admitting: Sports Medicine

## 2023-02-28 DIAGNOSIS — E1129 Type 2 diabetes mellitus with other diabetic kidney complication: Secondary | ICD-10-CM

## 2023-03-03 ENCOUNTER — Other Ambulatory Visit: Payer: Self-pay | Admitting: Sports Medicine

## 2023-03-03 DIAGNOSIS — I10 Essential (primary) hypertension: Secondary | ICD-10-CM

## 2023-04-08 ENCOUNTER — Encounter: Payer: Self-pay | Admitting: Sports Medicine

## 2023-04-08 ENCOUNTER — Ambulatory Visit (INDEPENDENT_AMBULATORY_CARE_PROVIDER_SITE_OTHER): Payer: Medicare Other | Admitting: Sports Medicine

## 2023-04-08 VITALS — BP 120/72 | HR 75 | Temp 97.6°F | Resp 16 | Ht 62.0 in | Wt 140.0 lb

## 2023-04-08 DIAGNOSIS — E782 Mixed hyperlipidemia: Secondary | ICD-10-CM | POA: Diagnosis not present

## 2023-04-08 DIAGNOSIS — E1129 Type 2 diabetes mellitus with other diabetic kidney complication: Secondary | ICD-10-CM | POA: Diagnosis not present

## 2023-04-08 DIAGNOSIS — R809 Proteinuria, unspecified: Secondary | ICD-10-CM | POA: Diagnosis not present

## 2023-04-08 DIAGNOSIS — I10 Essential (primary) hypertension: Secondary | ICD-10-CM

## 2023-04-08 DIAGNOSIS — N182 Chronic kidney disease, stage 2 (mild): Secondary | ICD-10-CM | POA: Diagnosis not present

## 2023-04-08 DIAGNOSIS — N401 Enlarged prostate with lower urinary tract symptoms: Secondary | ICD-10-CM

## 2023-04-08 DIAGNOSIS — N189 Chronic kidney disease, unspecified: Secondary | ICD-10-CM | POA: Insufficient documentation

## 2023-04-08 DIAGNOSIS — R351 Nocturia: Secondary | ICD-10-CM

## 2023-04-08 MED ORDER — TAMSULOSIN HCL 0.4 MG PO CAPS: 0.4000 mg | ORAL_CAPSULE | Freq: Every day | ORAL | 3 refills | Status: DC

## 2023-04-08 NOTE — Progress Notes (Signed)
 Careteam: Patient Care Team: Venita Sheffield, MD as PCP - General (Internal Medicine) Chucky May, M.D., [facility] as Referring Physician (General Practice)  PLACE OF SERVICE:  [facility]  Advanced Directive information Does Patient Have a Medical Advance Directive?: Yes, Type of Advance Directive: Healthcare Power of [place];Living will, Does patient want to make changes to medical advance directive?: No - Patient declined  Allergies  Allergen Reactions   Losartan     Other reaction(s): Cough   Pneumococcal Vac Polyvalent    Septra [Bactrim] Nausea And Vomiting   Trulicity [Dulaglutide]     diarrhea   Metformin Diarrhea   Sulfa Antibiotics Nausea And Vomiting and Rash    Chief Complaint  Patient presents with   Medical Management of Chronic Issues    3 month follow up. Discuss the need for DTAP vaccine, and Colonoscopy.      Discussed the use of AI scribe software for clinical note transcription with the patient, who gave verbal consent to proceed.  History of Present Illness  Jesse Baldwin is a 68 year old male with hypertension and diabetes who presents for routine follow-up.  His hypertension is well-controlled with amlodipine 5 mg daily. No dizziness or lightheadedness is reported.  Regarding diabetes, his last A1c was 8.1%. He monitors his blood sugar three times a week, with a recent reading of 132 mg/dL. He is on Jardiance and pioglitazone 30 mg, having switched from Prandin. No issues with metformin are reported. He exercises at the [facility] four times a week for about an hour and fifteen minutes each session, with no chest pain, shortness of breath, dizziness, or palpitations during exercise.  He experiences frequent nighttime urination, attributing it to prostate issues. No straining, dribbling, or burning during urination is reported, and urinary flow is described as neither strong nor weak.    No joint pain, heartburn, or acid reflux.  Occasional sleep disturbances occur due to frequent urination at night. No mood issues, depression, or anxiety are reported, and he has not experienced any falls in the past year.  His diet includes a slice of wheat bread with peanut butter and coffee for breakfast, and rice with vegetables like cabbage for lunch and dinner. He has previously used brown rice but currently consumes small amounts of white rice.    Review of Systems:  Review of Systems  Constitutional:  Negative for chills and fever.  HENT:  Negative for congestion and sore throat.   Eyes:  Negative for double vision.  Respiratory:  Negative for cough, sputum production and shortness of breath.   Cardiovascular:  Negative for chest pain, palpitations and leg swelling.  Gastrointestinal:  Negative for abdominal pain, heartburn and nausea.  Genitourinary:  Negative for dysuria, frequency and hematuria.  Musculoskeletal:  Negative for falls and myalgias.  Neurological:  Negative for dizziness, sensory change and focal weakness.   Negative unless indicated in HPI.   Past Medical History:  Diagnosis Date   Allergy    Arthritis    hands, back    Cataract    removed right eye    COVID-19    casirivimab\imdevimab infusions    Diabetes mellitus without complication (HCC)    History of colonoscopy    Hyperlipidemia    Hypertension    Past Surgical History:  Procedure Laterality Date   CARPAL TUNNEL RELEASE Bilateral    CATARACT EXTRACTION Right    CHOLECYSTECTOMY  1990   COLONOSCOPY     CYST REMOVAL NECK  POLYPECTOMY     Social History:   reports that he quit smoking about 42 years ago. His smoking use included cigarettes. He has never used smokeless tobacco. He reports that he does not drink alcohol and does not use drugs.  Family History  Problem Relation Age of Onset   Diabetes Mother 37   Hypertension Mother    Diabetes Father 23   Hypertension Father    Colon cancer Neg Hx    Colon polyps Neg Hx     Esophageal cancer Neg Hx    Rectal cancer Neg Hx    Stomach cancer Neg Hx     Medications: Patient's Medications  New Prescriptions   No medications on file  Previous Medications   AMLODIPINE (NORVASC) 5 MG TABLET    TAKE 1 TABLET (5 MG TOTAL) BY MOUTH DAILY.   DIPHENHYDRAMINE (BENADRYL) 2 % CREAM    Apply topically 3 (three) times daily as needed for itching.   JARDIANCE 25 MG TABS TABLET    TAKE 1 TABLET (25 MG TOTAL) BY MOUTH DAILY.   KETOCONAZOLE (NIZORAL) 2 % CREAM    Apply 1 Application topically as needed for irritation.   LATANOPROST (XALATAN) 0.005 % OPHTHALMIC SOLUTION    Place 1 drop into both eyes at bedtime.   MECLIZINE (ANTIVERT) 12.5 MG TABLET    Take 1 tablet (12.5 mg total) by mouth 3 (three) times daily as needed for dizziness.   NEOMYCIN-BACITRACIN-POLYMYXIN [phone] OINT    Apply topically as needed.   OFLOXACIN (FLOXIN) 0.3 % OTIC SOLUTION    Place 1 drop into the right ear as needed.   PANTOPRAZOLE (PROTONIX) 40 MG TABLET    Take 40 mg by mouth as needed.   PIOGLITAZONE (ACTOS) 30 MG TABLET    Take 1 tablet (30 mg total) by mouth daily.   ROSUVASTATIN (CRESTOR) 10 MG TABLET    Take 1 tablet (10 mg total) by mouth daily.  Modified Medications   No medications on file  Discontinued Medications   No medications on file    Physical Exam: Vitals:   04/08/23 0953  BP: 120/72  Pulse: 75  Resp: 16  Temp: 97.6 F (36.4 C)  SpO2: 98%  Weight: 140 lb (63.5 kg)  Height: 5\' 2"  (1.575 m)   Body mass index is 25.61 kg/m. BP Readings from Last 3 Encounters:  04/08/23 120/72  01/15/23 130/64  01/07/23 126/60   Wt Readings from Last 3 Encounters:  04/08/23 140 lb (63.5 kg)  01/15/23 138 lb 9.6 oz (62.9 kg)  01/07/23 138 lb 12.8 oz (63 kg)    Physical Exam Constitutional:      Appearance: Normal appearance.  HENT:     Head: Normocephalic and atraumatic.  Cardiovascular:     Rate and Rhythm: Normal rate and regular rhythm.     Pulses: Normal pulses.      Heart sounds: Normal heart sounds.  Pulmonary:     Effort: No respiratory distress.     Breath sounds: No stridor. No wheezing or rales.  Abdominal:     General: Bowel sounds are normal. There is no distension.     Palpations: Abdomen is soft.     Tenderness: There is no abdominal tenderness. There is no right CVA tenderness or guarding.  Musculoskeletal:        General: No swelling.  Neurological:     Mental Status: He is alert. Mental status is at baseline.     Motor: No weakness.  Labs reviewed: Basic Metabolic Panel: Recent Labs    08/23/22 0827 11/27/22 0910  NA 138 141  K 4.4 4.7  CL 106 105  CO2 23 26  GLUCOSE 204* 144*  BUN 35* 25  CREATININE 2.29* 1.19  CALCIUM 9.8 9.9  TSH  --  1.71   Liver Function Tests: Recent Labs    08/23/22 0827  AST 35  ALT 63*  BILITOT 0.8  PROT 8.4*   No results for input(s): "LIPASE", "AMYLASE" in the last 8760 hours. No results for input(s): "AMMONIA" in the last 8760 hours. CBC: Recent Labs    08/23/22 0827  WBC 7.7  NEUTROABS 4,820  HGB 16.9  HCT 52.5*  MCV 88.2  PLT 191   Lipid Panel: Recent Labs    08/23/22 0827  CHOL 140  HDL 43  LDLCALC 75  TRIG 136  CHOLHDL 3.3   TSH: Recent Labs    11/27/22 0910  TSH 1.71   A1C: Lab Results  Component Value Date   HGBA1C 8.1 (H) 11/22/2022    Assessment and Plan Assessment & Plan  Type 2 Diabetes Mellitus Suboptimal control with last A1c at 8.1, target A1c is 7-7.5. Current medications include Jardiance and pioglitazone. Blood sugars are checked three times a week, with a recent reading of 132 mg/dL in the morning. Diet includes white rice, which may contribute to elevated blood sugars. - Order A1c test - Increase pioglitazone to 45 mg if A1c is not around 7.5 -   Benign Prostatic Hyperplasia (BPH) Nocturia and frequent urination at night, likely due to BPH.  Informed about potential dizziness when standing up due to Flomax. - Prescribe Flomax for  BPH - Advise caution of dizziness when standing up and to maintain adequate hydration  Hypertension Well-controlled with amlodipine 5 mg. Current blood pressure is 120/72 mmHg. No symptoms of dizziness or lightheadedness reported.  Hyperlipidemia Managed with Crestor 10 mg at bedtime. No recent lipid panel results discussed.  Ckd Will check bmp    30 min  Total time spent for obtaining history,  performing a medically appropriate examination and evaluation, reviewing the tests,   documenting clinical information in the electronic or other health record,   ,care coordination (not separately reported)

## 2023-04-09 ENCOUNTER — Other Ambulatory Visit: Payer: Self-pay | Admitting: Sports Medicine

## 2023-04-09 LAB — BASIC METABOLIC PANEL WITHOUT GFR
BUN: 23 mg/dL (ref 7–25)
CO2: 28 mmol/L (ref 20–32)
Calcium: 10 mg/dL (ref 8.6–10.3)
Chloride: 103 mmol/L (ref 98–110)
Creat: 1.19 mg/dL (ref 0.70–1.35)
Glucose, Bld: 130 mg/dL (ref 65–139)
Potassium: 4.1 mmol/L (ref 3.5–5.3)
Sodium: 140 mmol/L (ref 135–146)

## 2023-04-09 LAB — HEMOGLOBIN A1C
Hgb A1c MFr Bld: 8.1 %{Hb} — ABNORMAL HIGH (ref <5.7)
Mean Plasma Glucose: 186 mg/dL
eAG (mmol/L): 10.3 mmol/L

## 2023-04-09 MED ORDER — PIOGLITAZONE HCL 45 MG PO TABS: 45.0000 mg | ORAL_TABLET | Freq: Every day | ORAL | 2 refills | Status: DC

## 2023-05-02 DIAGNOSIS — H1045 Other chronic allergic conjunctivitis: Secondary | ICD-10-CM | POA: Diagnosis not present

## 2023-05-02 DIAGNOSIS — H401131 Primary open-angle glaucoma, bilateral, mild stage: Secondary | ICD-10-CM | POA: Diagnosis not present

## 2023-05-29 ENCOUNTER — Other Ambulatory Visit: Payer: Self-pay | Admitting: Sports Medicine

## 2023-05-29 DIAGNOSIS — E1129 Type 2 diabetes mellitus with other diabetic kidney complication: Secondary | ICD-10-CM

## 2023-06-28 ENCOUNTER — Other Ambulatory Visit: Payer: Self-pay | Admitting: Sports Medicine

## 2023-07-04 ENCOUNTER — Other Ambulatory Visit: Payer: Self-pay | Admitting: Sports Medicine

## 2023-07-04 DIAGNOSIS — N401 Enlarged prostate with lower urinary tract symptoms: Secondary | ICD-10-CM

## 2023-07-09 ENCOUNTER — Encounter: Payer: Self-pay | Admitting: Sports Medicine

## 2023-07-09 ENCOUNTER — Ambulatory Visit (INDEPENDENT_AMBULATORY_CARE_PROVIDER_SITE_OTHER): Admitting: Sports Medicine

## 2023-07-09 VITALS — BP 128/82 | HR 61 | Temp 97.9°F | Resp 10 | Ht 62.0 in | Wt 138.2 lb

## 2023-07-09 DIAGNOSIS — E782 Mixed hyperlipidemia: Secondary | ICD-10-CM

## 2023-07-09 DIAGNOSIS — I10 Essential (primary) hypertension: Secondary | ICD-10-CM | POA: Diagnosis not present

## 2023-07-09 DIAGNOSIS — R351 Nocturia: Secondary | ICD-10-CM

## 2023-07-09 DIAGNOSIS — N1831 Chronic kidney disease, stage 3a: Secondary | ICD-10-CM

## 2023-07-09 DIAGNOSIS — R809 Proteinuria, unspecified: Secondary | ICD-10-CM | POA: Diagnosis not present

## 2023-07-09 DIAGNOSIS — E1129 Type 2 diabetes mellitus with other diabetic kidney complication: Secondary | ICD-10-CM

## 2023-07-09 DIAGNOSIS — N401 Enlarged prostate with lower urinary tract symptoms: Secondary | ICD-10-CM

## 2023-07-09 MED ORDER — AMLODIPINE BESYLATE 5 MG PO TABS: 5.0000 mg | ORAL_TABLET | Freq: Every day | ORAL | 1 refills | Status: AC

## 2023-07-09 MED ORDER — VALSARTAN 40 MG PO TABS
40.0000 mg | ORAL_TABLET | Freq: Every day | ORAL | 3 refills | Status: AC
Start: 2023-07-09 — End: ?

## 2023-07-09 MED ORDER — TAMSULOSIN HCL 0.4 MG PO CAPS: 0.4000 mg | ORAL_CAPSULE | Freq: Every day | ORAL | 1 refills | Status: AC

## 2023-07-09 MED ORDER — PIOGLITAZONE HCL 45 MG PO TABS: 45.0000 mg | ORAL_TABLET | Freq: Every day | ORAL | 2 refills | Status: AC

## 2023-07-09 NOTE — Progress Notes (Signed)
 Careteam: Patient Care Team: Sherlynn Madden, MD as PCP - General (Internal Medicine) Nancyann DOROTHA Ebbing, M.D., [facility] as Referring Physician (General Practice)  PLACE OF SERVICE:  [facility]  Advanced Directive information    Allergies  Allergen Reactions   Losartan      Other reaction(s): Cough   Pneumococcal Vac Polyvalent    Septra [Bactrim] Nausea And Vomiting   Trulicity  [Dulaglutide ]     diarrhea   Metformin  Diarrhea   Sulfa Antibiotics Nausea And Vomiting and Rash    Chief Complaint  Patient presents with   Medical Management of Chronic Issues    3 Month follow up     Discussed the use of AI scribe software for clinical note transcription with the patient, who gave verbal consent to proceed.  History of Present Illness  Jesse Baldwin is a 68 year old male with hypertension and diabetes who presents for routine follow-up.  His blood pressure is elevated today. He is compliant with his meds ,He is on  amlodipine  5 mg and valsartan 40 mg, which he receives from the [facility].  SABRA No chest pain, shortness of breath, or palpitations.  He has a history of diabetes with a recent A1c of 8.1.  He monitors his blood glucose levels three times a week and is on Jardiance  25 mg and Actos  50 mg. He finds it challenging to control his diet, particularly with sweets during the summer.  He takes Crestor  nightly for cholesterol management. He last visited an eye doctor two months ago and reports no vision issues.  He experiences nocturia, waking up twice at night to urinate, and is on Flomax  to aid urinary flow. No fever, chills, congestion, sore throat, cough, breathing problems, dizziness, swelling in the feet, heartburn, or acid reflux. Regular bowel movements and no blood in urine or stool.  He denies any falls or joint pains and reports a stable mood without depression or anxiety. He is retired, spends time with his grandchildren, and exercises regularly, walking  or using the treadmill and elliptical four times a day for one to one and a half hours each session.  He had his last colonoscopy at the [facility] three years ago    Review of Systems:  Review of Systems  Constitutional:  Negative for chills and fever.  HENT:  Negative for congestion and sore throat.   Respiratory:  Negative for cough and shortness of breath.   Cardiovascular:  Negative for chest pain, palpitations and leg swelling.  Gastrointestinal:  Negative for abdominal pain, constipation, heartburn, nausea and vomiting.  Genitourinary:  Negative for dysuria and hematuria.  Musculoskeletal:  Negative for falls and joint pain.  Neurological:  Negative for dizziness.  Psychiatric/Behavioral:  Negative for depression. The patient does not have insomnia.    Negative unless indicated in HPI.   Past Medical History:  Diagnosis Date   Allergy    Arthritis    hands, back    Cataract    removed right eye    COVID-19    casirivimab \imdevimab  infusions    Diabetes mellitus without complication (HCC)    History of colonoscopy    Hyperlipidemia    Hypertension    Past Surgical History:  Procedure Laterality Date   CARPAL TUNNEL RELEASE Bilateral    CATARACT EXTRACTION Right    CHOLECYSTECTOMY  1990   COLONOSCOPY     CYST REMOVAL NECK     POLYPECTOMY     Social History:   reports that he quit  smoking about 42 years ago. His smoking use included cigarettes. He has never used smokeless tobacco. He reports that he does not drink alcohol and does not use drugs.  Family History  Problem Relation Age of Onset   Diabetes Mother 26   Hypertension Mother    Diabetes Father 55   Hypertension Father    Colon cancer Neg Hx    Colon polyps Neg Hx    Esophageal cancer Neg Hx    Rectal cancer Neg Hx    Stomach cancer Neg Hx     Medications: Patient's Medications  New Prescriptions   No medications on file  Previous Medications   AMLODIPINE  (NORVASC ) 5 MG TABLET    TAKE 1 TABLET (5 MG  TOTAL) BY MOUTH DAILY.   DIPHENHYDRAMINE  (BENADRYL ) 2 % CREAM    Apply topically 3 (three) times daily as needed for itching.   JARDIANCE  25 MG TABS TABLET    TAKE 1 TABLET (25 MG TOTAL) BY MOUTH DAILY.   KETOCONAZOLE  (NIZORAL ) 2 % CREAM    Apply 1 Application topically as needed for irritation.   LATANOPROST  (XALATAN ) 0.005 % OPHTHALMIC SOLUTION    INSTILL 1 DROP INTO BOTH EYES AT BEDTIME   MECLIZINE  (ANTIVERT ) 12.5 MG TABLET    Take 1 tablet (12.5 mg total) by mouth 3 (three) times daily as needed for dizziness.   NEOMYCIN-BACITRACIN-POLYMYXIN [phone] OINT    Apply topically as needed.   OFLOXACIN  (FLOXIN ) 0.3 % OTIC SOLUTION    Place 1 drop into the right ear as needed.   PANTOPRAZOLE  (PROTONIX ) 40 MG TABLET    Take 40 mg by mouth as needed.   PIOGLITAZONE  (ACTOS ) 45 MG TABLET    Take 1 tablet (45 mg total) by mouth daily.   ROSUVASTATIN  (CRESTOR ) 10 MG TABLET    Take 1 tablet (10 mg total) by mouth daily.   TAMSULOSIN  (FLOMAX ) 0.4 MG CAPS CAPSULE    TAKE 1 CAPSULE BY MOUTH EVERY DAY   VALSARTAN (DIOVAN) 40 MG TABLET    Take 40 mg by mouth.  Modified Medications   No medications on file  Discontinued Medications   No medications on file    Physical Exam: Vitals:   07/09/23 0903  BP: (!) 148/84  Pulse: 61  Resp: 10  SpO2: 98%  Weight: 138 lb 3.2 oz (62.7 kg)  Height: 5' 2 (1.575 m)   Body mass index is 25.28 kg/m. BP Readings from Last 3 Encounters:  07/09/23 (!) 148/84  04/08/23 120/72  01/15/23 130/64   Wt Readings from Last 3 Encounters:  07/09/23 138 lb 3.2 oz (62.7 kg)  04/08/23 140 lb (63.5 kg)  01/15/23 138 lb 9.6 oz (62.9 kg)    Physical Exam Constitutional:      Appearance: Normal appearance.  HENT:     Head: Normocephalic and atraumatic.  Cardiovascular:     Rate and Rhythm: Normal rate and regular rhythm.     Pulses: Normal pulses.     Heart sounds: Normal heart sounds.  Pulmonary:     Effort: No respiratory distress.     Breath sounds: No  stridor. No wheezing or rales.  Abdominal:     General: Bowel sounds are normal. There is no distension.     Palpations: Abdomen is soft.     Tenderness: There is no abdominal tenderness. There is no guarding.  Musculoskeletal:        General: No swelling.  Neurological:     Mental Status: He is alert. Mental  status is at baseline.     Motor: No weakness.     Labs reviewed: Basic Metabolic Panel: Recent Labs    08/23/22 0827 11/27/22 0910 04/08/23 1020  NA 138 141 140  K 4.4 4.7 4.1  CL 106 105 103  CO2 23 26 28   GLUCOSE 204* 144* 130  BUN 35* 25 23  CREATININE 2.29* 1.19 1.19  CALCIUM  9.8 9.9 10.0  TSH  --  1.71  --    Liver Function Tests: Recent Labs    08/23/22 0827  AST 35  ALT 63*  BILITOT 0.8  PROT 8.4*   No results for input(s): LIPASE, AMYLASE in the last 8760 hours. No results for input(s): AMMONIA in the last 8760 hours. CBC: Recent Labs    08/23/22 0827  WBC 7.7  NEUTROABS 4,820  HGB 16.9  HCT 52.5*  MCV 88.2  PLT 191   Lipid Panel: Recent Labs    08/23/22 0827  CHOL 140  HDL 43  LDLCALC 75  TRIG 136  CHOLHDL 3.3   TSH: Recent Labs    11/27/22 0910  TSH 1.71   A1C: Lab Results  Component Value Date   HGBA1C 8.1 (H) 04/08/2023    Assessment and Plan Assessment & Plan  1. Primary hypertension (Primary) Repeat bp improved - amLODipine  (NORVASC ) 5 MG tablet; Take 1 tablet (5 mg total) by mouth daily.  Dispense: 90 tablet; Refill: 1 - valsartan (DIOVAN) 40 MG tablet; Take 1 tablet (40 mg total) by mouth daily.  Dispense: 90 tablet; Refill: 3   2. Type 2 diabetes mellitus with microalbuminuria, without long-term current use of insulin  (HCC) Will check A1c Instructed patient to avoid high carbohydrate foods Exercise regularly  - Hemoglobin A1c - pioglitazone  (ACTOS ) 45 MG tablet; Take 1 tablet (45 mg total) by mouth daily.  Dispense: 90 tablet; Refill: 2  3. Stage 3a chronic kidney disease (HCC) Will check bmp Avoid  nsaids Increase oral hydration   4. Mixed hyperlipidemia Cont with crestor   5. Benign prostatic hyperplasia with nocturia Refilled flomax   - tamsulosin  (FLOMAX ) 0.4 MG CAPS capsule; Take 1 capsule (0.4 mg total) by mouth daily.  Dispense: 90 capsule; Refill: 1

## 2023-07-10 ENCOUNTER — Ambulatory Visit: Payer: Self-pay | Admitting: Sports Medicine

## 2023-07-10 LAB — HEMOGLOBIN A1C
Hgb A1c MFr Bld: 8 % — ABNORMAL HIGH (ref <5.7)
Mean Plasma Glucose: 183 mg/dL
eAG (mmol/L): 10.1 mmol/L

## 2023-07-10 MED ORDER — METFORMIN HCL ER 500 MG PO TB24: 1000.0000 mg | ORAL_TABLET | Freq: Every day | ORAL | 0 refills | Status: AC

## 2023-07-16 ENCOUNTER — Ambulatory Visit: Admitting: Sports Medicine

## 2023-07-31 ENCOUNTER — Encounter: Payer: Self-pay | Admitting: Pharmacist

## 2023-07-31 NOTE — Progress Notes (Signed)
   07/31/2023  Patient ID: Jesse Baldwin, male   DOB: [birth year], 68 y.o.   MRN: [id number]  Pharmacy Quality Measure Review  This patient is appearing on a report for being at risk of failing the adherence measure for diabetes medications this calendar year.   Medication: Pioglitazone  45 mg Last fill date: 07/09/23 for 90 day supply  Insurance report was not up to date. No action needed at this time.    Cassius DOROTHA Brought, PharmD, BCACP Clinical Pharmacist [phone]

## 2023-08-28 ENCOUNTER — Other Ambulatory Visit: Payer: Self-pay | Admitting: Sports Medicine

## 2023-12-10 ENCOUNTER — Other Ambulatory Visit: Payer: Self-pay | Admitting: Sports Medicine

## 2023-12-15 NOTE — Progress Notes (Signed)
 Glover Whitis                                          MRN: [id number]   12/15/2023   The [facility] Quality Team Specialist reviewed this patient medical record for the purposes of chart review for care gap closure. The following were reviewed: chart review for care gap closure-kidney health evaluation for diabetes:eGFR  and uACR.    [facility] Quality Team

## 2023-12-30 NOTE — Progress Notes (Signed)
 Deray Mickley                                          MRN: [id number]   12/30/2023   The [facility] Quality Team Specialist reviewed this patient medical record for the purposes of chart review for care gap closure. The following were reviewed: chart review for care gap closure-kidney health evaluation for diabetes:eGFR  and uACR.    [facility] Quality Team

## 2024-01-21 ENCOUNTER — Ambulatory Visit (INDEPENDENT_AMBULATORY_CARE_PROVIDER_SITE_OTHER): Payer: Medicare Other | Admitting: Family

## 2024-01-21 ENCOUNTER — Encounter: Payer: Self-pay | Admitting: Family

## 2024-01-21 VITALS — BP 120/64 | HR 71 | Temp 97.3°F | Ht 62.0 in | Wt 141.0 lb

## 2024-01-21 DIAGNOSIS — Z0000 Encounter for general adult medical examination without abnormal findings: Secondary | ICD-10-CM | POA: Diagnosis not present

## 2024-01-21 DIAGNOSIS — E1129 Type 2 diabetes mellitus with other diabetic kidney complication: Secondary | ICD-10-CM | POA: Diagnosis not present

## 2024-01-21 DIAGNOSIS — Z7984 Long term (current) use of oral hypoglycemic drugs: Secondary | ICD-10-CM

## 2024-01-21 DIAGNOSIS — R809 Proteinuria, unspecified: Secondary | ICD-10-CM | POA: Diagnosis not present

## 2024-01-21 LAB — MICROALBUMIN / CREATININE URINE RATIO
Creatinine, Urine: 65 mg/dL (ref 20–320)
Microalb Creat Ratio: 49 mg/g{creat} — ABNORMAL HIGH
Microalb, Ur: 3.2 mg/dL

## 2024-01-21 NOTE — Progress Notes (Signed)
 "  Chief Complaint  Patient presents with   Medicare annual wellness    Patient presents today for annual wellness visit. Discuss the need for lab work, and foot exam.      Subjective:   Jesse Baldwin is a 69 y.o. male who presents for a Medicare Annual Wellness Visit.  Visit info / Clinical Intake: Medicare Wellness Visit Type:: Subsequent Annual Wellness Visit Persons participating in visit and providing information:: patient Medicare Wellness Visit Mode:: In-person (required for WTM) Interpreter Needed?: No Pre-visit prep was completed: no AWV questionnaire completed by patient prior to visit?: no Living arrangements:: lives with spouse/significant other Patient's Overall Health Status Rating: good Typical amount of pain: some (Weather change) Does pain affect daily life?: no Are you currently prescribed opioids?: no  Dietary Habits and Nutritional Risks How many meals a day?: 3 Eats fruit and vegetables daily?: (!) no Most meals are obtained by: preparing own meals In the last 2 weeks, have you had any of the following?: none Diabetic:: (!) yes Any non-healing wounds?: no How often do you check your BS?: 3 (3 times weekly) Would you like to be referred to a Nutritionist or for Diabetic Management? : no  Functional Status Activities of Daily Living (to include ambulation/medication): Independent Ambulation: Independent Medication Administration: Independent Home Management (perform basic housework or laundry): Independent Manage your own finances?: yes Primary transportation is: driving Concerns about vision?: no *vision screening is required for WTM* Concerns about hearing?: no  Fall Screening Falls in the past year?: 0 Number of falls in past year: 0 Was there an injury with Fall?: 0 Fall Risk Category Calculator: 0 Patient Fall Risk Level: Low Fall Risk  Fall Risk Patient at Risk for Falls Due to: No Fall Risks Fall risk Follow up: Falls evaluation  completed  Home and Transportation Safety: All rugs have non-skid backing?: yes All stairs or steps have railings?: yes Grab bars in the bathtub or shower?: yes Have non-skid surface in bathtub or shower?: yes Good home lighting?: yes Regular seat belt use?: yes Hospital stays in the last year:: no  Cognitive Assessment Difficulty concentrating, remembering, or making decisions? : no Will 6CIT or Mini Cog be Completed: no 6CIT or Mini Cog Declined: patient declined  Advance Directives (For Healthcare) Does Patient Have a Medical Advance Directive?: Yes Does patient want to make changes to medical advance directive?: No - Patient declined Type of Advance Directive: Healthcare Power of [place]; Living will; Out of facility DNR (pink MOST or yellow form) Copy of Healthcare Power of Attorney in Chart?: No - copy requested Copy of Living Will in Chart?: No - copy requested Out of facility DNR (pink MOST or yellow form) in Chart? (Ambulatory ONLY): No - copy requested  Reviewed/Updated  Reviewed/Updated: Reviewed All (Medical, Surgical, Family, Medications, Allergies, Care Teams, Patient Goals); Medical History; Surgical History; Family History; Medications; Allergies    Allergies (verified) Losartan , Pneumococcal vac polyvalent, Septra [bactrim], Trulicity  [dulaglutide ], Metformin , and Sulfa antibiotics   Current Medications (verified) Outpatient Encounter Medications as of 01/21/2024  Medication Sig   amLODipine  (NORVASC ) 5 MG tablet Take 1 tablet (5 mg total) by mouth daily.   amoxicillin  (AMOXIL ) 875 MG tablet Take 875 mg by mouth 2 (two) times daily.   JARDIANCE  25 MG TABS tablet TAKE 1 TABLET (25 MG TOTAL) BY MOUTH DAILY.   ketoconazole  (NIZORAL ) 2 % cream Apply 1 Application topically as needed for irritation.   latanoprost  (XALATAN ) 0.005 % ophthalmic solution INSTILL 1 DROP INTO BOTH  EYES AT BEDTIME   meclizine  (ANTIVERT ) 12.5 MG tablet Take 1 tablet (12.5 mg total) by  mouth 3 (three) times daily as needed for dizziness.   metFORMIN  (GLUCOPHAGE -XR) 500 MG 24 hr tablet Take 2 tablets (1,000 mg total) by mouth daily with breakfast.   neomycin-bacitracin-polymyxin [phone] OINT Apply topically as needed.   ofloxacin  (FLOXIN ) 0.3 % OTIC solution Place 1 drop into the right ear as needed.   pantoprazole  (PROTONIX ) 40 MG tablet Take 40 mg by mouth as needed.   pioglitazone  (ACTOS ) 45 MG tablet Take 1 tablet (45 mg total) by mouth daily.   rosuvastatin  (CRESTOR ) 10 MG tablet TAKE 1 TABLET BY MOUTH EVERY DAY   tamsulosin  (FLOMAX ) 0.4 MG CAPS capsule Take 1 capsule (0.4 mg total) by mouth daily.   valsartan  (DIOVAN ) 40 MG tablet Take 1 tablet (40 mg total) by mouth daily.   Facility-Administered Encounter Medications as of 01/21/2024  Medication   albuterol  (VENTOLIN  HFA) 108 (90 Base) MCG/ACT inhaler 2 puff    History: Past Medical History:  Diagnosis Date   Allergy    Arthritis    hands, back    Cataract    removed right eye    COVID-19    casirivimab \imdevimab  infusions    Diabetes mellitus without complication (HCC)    History of colonoscopy    Hyperlipidemia    Hypertension    Past Surgical History:  Procedure Laterality Date   CARPAL TUNNEL RELEASE Bilateral    CATARACT EXTRACTION Right    CHOLECYSTECTOMY  1990   COLONOSCOPY     CYST REMOVAL NECK     POLYPECTOMY     Family History  Problem Relation Age of Onset   Diabetes Mother 80   Hypertension Mother    Diabetes Father 64   Hypertension Father    Colon cancer Neg Hx    Colon polyps Neg Hx    Esophageal cancer Neg Hx    Rectal cancer Neg Hx    Stomach cancer Neg Hx    Social History   Occupational History   Not on file  Tobacco Use   Smoking status: Former    Current packs/day: 0.00    Types: Cigarettes    Quit date: 04/07/1981    Years since quitting: 42.8   Smokeless tobacco: Never  Vaping Use   Vaping status: Never Used  Substance and Sexual Activity   Alcohol  use: No   Drug use: No   Sexual activity: Yes    Birth control/protection: None   Tobacco Counseling Counseling given: Not Answered  SDOH Screenings   Food Insecurity: No Food Insecurity (04/08/2023)  Housing: Low Risk (04/08/2023)  Transportation Needs: No Transportation Needs (04/08/2023)  Utilities: Not At Risk (04/08/2023)  Depression (PHQ2-9): Low Risk (01/21/2024)  Social Connections: Socially Integrated (04/08/2023)  Tobacco Use: Medium Risk (01/21/2024)   See flowsheets for full screening details  Depression Screen PHQ 2 & 9 Depression Scale- Over the past 2 weeks, how often have you been bothered by any of the following problems? Little interest or pleasure in doing things: 0 Feeling down, depressed, or hopeless (PHQ Adolescent also includes...irritable): 0 PHQ-2 Total Score: 0     Goals Addressed   None          Objective:    Today's Vitals   01/21/24 0927  BP: 120/64  Pulse: 71  Temp: (!) 97.3 F (36.3 C)  SpO2: 99%  Weight: 141 lb (64 kg)  Height: 5' 2 (1.575 m)  Body mass index is 25.79 kg/m.  Hearing/Vision screen Hearing Screening - Comments:: Patient denies any issues with hearing today.  Vision Screening - Comments:: 11/2023 with Dr. Belenda In [place]. No issues with vision today.  Immunizations and Health Maintenance Health Maintenance  Topic Date Due   FOOT EXAM  08/28/2023   Diabetic kidney evaluation - Urine ACR  11/27/2023   Medicare Annual Wellness (AWV)  01/15/2024   HEMOGLOBIN A1C  01/09/2024   COVID-19 Vaccine (6 - 2025-26 season) 02/06/2024 (Originally 09/08/2023)   DTaP/Tdap/Td (2 - Tdap) 07/08/2024 (Originally 01/09/2020)   Pneumococcal Vaccine: 50+ Years (3 of 3 - PCV20 or PCV21) 11/23/2030 (Originally 11/05/2020)   Diabetic kidney evaluation - eGFR measurement  04/07/2024   OPHTHALMOLOGY EXAM  11/07/2024   Colonoscopy  06/08/2026   Influenza Vaccine  Completed   Hepatitis C Screening  Completed   Zoster Vaccines- Shingrix   Completed   Meningococcal B Vaccine  Aged Out        Assessment/Plan:  This is a routine wellness examination for Jesse Baldwin.  Patient Care Team: Lenus Trauger C, NP as PCP - General (Family Medicine) Nancyann DOROTHA Ebbing, M.D., [facility] as Referring Physician (General Practice)  I have personally reviewed and noted the following in the patients chart:   Medical and social history Use of alcohol, tobacco or illicit drugs  Current medications and supplements including opioid prescriptions. Functional ability and status Nutritional status Physical activity Advanced directives List of other physicians Hospitalizations, surgeries, and ER visits in previous 12 months Vitals Screenings to include cognitive, depression, and falls Referrals and appointments  No orders of the defined types were placed in this encounter.  In addition, I have reviewed and discussed with patient certain preventive protocols, quality metrics, and best practice recommendations. A written personalized care plan for preventive services as well as general preventive health recommendations were provided to patient.   Jesse BROCKS Aaliyah Gavel, NP   01/21/2024   No follow-ups on file.  After Visit Summary: (In Person-Printed) AVS printed and given to the patient  Nurse Notes: Schedule for routine visit and fasting labs in one week  -  "

## 2024-01-21 NOTE — Patient Instructions (Addendum)
 Okay 1 year next week for lab work due for covid vaccine can obtain at your local pharmacy.

## 2024-01-22 ENCOUNTER — Ambulatory Visit: Payer: Self-pay | Admitting: Family

## 2024-01-27 ENCOUNTER — Other Ambulatory Visit

## 2024-01-27 ENCOUNTER — Other Ambulatory Visit: Payer: Self-pay

## 2024-01-27 DIAGNOSIS — N401 Enlarged prostate with lower urinary tract symptoms: Secondary | ICD-10-CM

## 2024-01-27 DIAGNOSIS — N1831 Chronic kidney disease, stage 3a: Secondary | ICD-10-CM

## 2024-01-27 DIAGNOSIS — E1129 Type 2 diabetes mellitus with other diabetic kidney complication: Secondary | ICD-10-CM

## 2024-01-27 DIAGNOSIS — E782 Mixed hyperlipidemia: Secondary | ICD-10-CM

## 2024-01-28 LAB — CBC WITH DIFFERENTIAL/PLATELET
Absolute Lymphocytes: 1513 {cells}/uL (ref 850–3900)
Absolute Monocytes: 471 {cells}/uL (ref 200–950)
Basophils Absolute: 37 {cells}/uL (ref 0–200)
Basophils Relative: 0.6 %
Eosinophils Absolute: 130 {cells}/uL (ref 15–500)
Eosinophils Relative: 2.1 %
HCT: 50.1 % (ref 39.4–51.1)
Hemoglobin: 15.6 g/dL (ref 13.2–17.1)
MCH: 28.2 pg (ref 27.0–33.0)
MCHC: 31.1 g/dL — ABNORMAL LOW (ref 31.6–35.4)
MCV: 90.4 fL (ref 81.4–101.7)
MPV: 11.5 fL (ref 7.5–12.5)
Monocytes Relative: 7.6 %
Neutro Abs: 4049 {cells}/uL (ref 1500–7800)
Neutrophils Relative %: 65.3 %
Platelets: 180 Thousand/uL (ref 140–400)
RBC: 5.54 Million/uL (ref 4.20–5.80)
RDW: 13.9 % (ref 11.0–15.0)
Total Lymphocyte: 24.4 %
WBC: 6.2 Thousand/uL (ref 3.8–10.8)

## 2024-01-28 LAB — LIPID PANEL
Cholesterol: 273 mg/dL — ABNORMAL HIGH
HDL: 55 mg/dL
LDL Cholesterol (Calc): 188 mg/dL — ABNORMAL HIGH
Non-HDL Cholesterol (Calc): 218 mg/dL — ABNORMAL HIGH
Total CHOL/HDL Ratio: 5 (calc) — ABNORMAL HIGH
Triglycerides: 156 mg/dL — ABNORMAL HIGH

## 2024-01-28 LAB — COMPLETE METABOLIC PANEL WITHOUT GFR
AG Ratio: 1.3 (calc) (ref 1.0–2.5)
ALT: 14 U/L (ref 9–46)
AST: 22 U/L (ref 10–35)
Albumin: 4.3 g/dL (ref 3.6–5.1)
Alkaline phosphatase (APISO): 73 U/L (ref 35–144)
BUN/Creatinine Ratio: 15 (calc) (ref 6–22)
BUN: 22 mg/dL (ref 7–25)
CO2: 29 mmol/L (ref 20–32)
Calcium: 9.7 mg/dL (ref 8.6–10.3)
Chloride: 102 mmol/L (ref 98–110)
Creat: 1.47 mg/dL — ABNORMAL HIGH (ref 0.70–1.28)
Globulin: 3.4 g/dL (ref 1.9–3.7)
Glucose, Bld: 132 mg/dL — ABNORMAL HIGH (ref 65–99)
Potassium: 4.9 mmol/L (ref 3.5–5.3)
Sodium: 139 mmol/L (ref 135–146)
Total Bilirubin: 0.6 mg/dL (ref 0.2–1.2)
Total Protein: 7.7 g/dL (ref 6.1–8.1)

## 2024-01-28 LAB — HEMOGLOBIN A1C
Hgb A1c MFr Bld: 8 % — ABNORMAL HIGH
Mean Plasma Glucose: 183 mg/dL
eAG (mmol/L): 10.1 mmol/L

## 2024-01-28 LAB — TSH: TSH: 2.02 m[IU]/L (ref 0.40–4.50)

## 2024-02-24 ENCOUNTER — Ambulatory Visit: Admitting: Family

## 2025-01-21 ENCOUNTER — Ambulatory Visit: Admitting: Family
